# Patient Record
Sex: Male | Born: 1985
Health system: Southern US, Community
[De-identification: ages and names within clinical notes are randomized; demographics above are authoritative.]

## PROBLEM LIST (undated history)

## (undated) DIAGNOSIS — D561 Beta thalassemia: Secondary | ICD-10-CM

## (undated) DIAGNOSIS — K219 Gastro-esophageal reflux disease without esophagitis: Secondary | ICD-10-CM

## (undated) HISTORY — DX: Beta thalassemia: D56.1

---

## 2004-08-07 ENCOUNTER — Ambulatory Visit (HOSPITAL_COMMUNITY): Admission: RE | Admit: 2004-08-07 | Discharge: 2004-08-07 | Payer: Self-pay | Admitting: Internal Medicine

## 2009-09-22 ENCOUNTER — Emergency Department (HOSPITAL_COMMUNITY): Admission: EM | Admit: 2009-09-22 | Discharge: 2009-09-22 | Payer: Self-pay | Admitting: Emergency Medicine

## 2010-02-16 ENCOUNTER — Emergency Department (HOSPITAL_COMMUNITY)
Admission: EM | Admit: 2010-02-16 | Discharge: 2010-02-16 | Payer: Self-pay | Source: Home / Self Care | Admitting: Emergency Medicine

## 2010-05-04 LAB — BASIC METABOLIC PANEL
BUN: 9 mg/dL (ref 6–23)
CO2: 26 mEq/L (ref 19–32)
Chloride: 105 mEq/L (ref 96–112)
Creatinine, Ser: 0.95 mg/dL (ref 0.4–1.5)
GFR calc Af Amer: >60 mL/min (ref >60)
Sodium: 138 mEq/L (ref 135–145)

## 2010-05-04 LAB — CBC
MCHC: 31.8 g/dL (ref 30.0–36.0)
MCV: 59 fL — ABNORMAL LOW (ref 78.0–100.0)

## 2010-05-04 LAB — DIFFERENTIAL
Basophils Relative: 0 % (ref 0–1)
Eosinophils Absolute: 0.3 10*3/uL (ref 0.0–0.7)
Eosinophils Relative: 4 % (ref 0–5)
Lymphocytes Relative: 29 % (ref 12–46)
Lymphs Abs: 1.8 10*3/uL (ref 0.7–4.0)
Monocytes Relative: 8 % (ref 3–12)
Neutrophils Relative %: 59 % (ref 43–77)

## 2010-05-04 LAB — POCT CARDIAC MARKERS
Myoglobin, poc: 35.7 ng/mL (ref 12–200)
Troponin i, poc: <0.05 ng/mL (ref 0.00–0.09)

## 2010-07-10 NOTE — Procedures (Signed)
NAMECROSLEY, STEJSKAL                ACCOUNT NO.:  [id number]   MEDICAL RECORD NO.:  [id number]          PATIENT TYPE:  OUT   LOCATION:  RAD                           FACILITY:  [facility]   PHYSICIAN:  Edward L. Juanetta Gosling, M.D.DATE OF BIRTH:  16-Apr-1985   DATE OF PROCEDURE:  08/07/2004  DATE OF DISCHARGE:  08/07/2004                              PULMONARY FUNCTION TEST   1.  There is a fairly distinctly restrictive shape to the flow-volume loop.      There is a ventilatory defect but no evidence of airflow obstruction.  2.  Total lung capacity is decreased to 63% of predicted which would suggest      a restrictive pulmonary problem.  3.  DLCO is normal.  4.  Arterial blood gases are normal.       ELH/MEDQ  D:  08/09/2004  T:  08/10/2004  Job:  [id number]

## 2011-11-09 ENCOUNTER — Encounter: Payer: Self-pay | Admitting: Family Medicine

## 2011-11-09 ENCOUNTER — Ambulatory Visit (INDEPENDENT_AMBULATORY_CARE_PROVIDER_SITE_OTHER): Payer: 59 | Admitting: Family Medicine

## 2011-11-09 VITALS — BP 112/78 | HR 70 | Resp 16 | Ht 69.0 in | Wt 189.0 lb

## 2011-11-09 DIAGNOSIS — Z23 Encounter for immunization: Secondary | ICD-10-CM

## 2011-11-09 DIAGNOSIS — J309 Allergic rhinitis, unspecified: Secondary | ICD-10-CM

## 2011-11-09 DIAGNOSIS — R12 Heartburn: Secondary | ICD-10-CM

## 2011-11-09 DIAGNOSIS — Z13220 Encounter for screening for lipoid disorders: Secondary | ICD-10-CM

## 2011-11-09 DIAGNOSIS — J302 Other seasonal allergic rhinitis: Secondary | ICD-10-CM | POA: Insufficient documentation

## 2011-11-09 MED ORDER — OMEPRAZOLE 20 MG PO CPDR: 20.0000 mg | DELAYED_RELEASE_CAPSULE | Freq: Every day | ORAL | Status: DC

## 2011-11-09 MED ORDER — FEXOFENADINE HCL 180 MG PO TABS: 180.0000 mg | ORAL_TABLET | Freq: Every day | ORAL | Status: DC

## 2011-11-09 NOTE — Assessment & Plan Note (Signed)
Trial of prilosec once a day, see handout given on GERD prevention Fasting labs to be done, Exam benign

## 2011-11-09 NOTE — Patient Instructions (Signed)
Get the labs done fasting Continue multivitamin  Start acid blocking medication Use allegra for allergies  Call if heartburn does not improve  F/U 1 year or as neededHeartburn Heartburn is a painful, burning sensation in the chest. It may feel worse in certain positions, such as lying down or bending over. It is caused by stomach acid backing up into the tube that carries food from the mouth down to the stomach (lower esophagus).   CAUSES    Large meals.   Certain foods and drinks.   Exercise.   Increased acid production.   Being overweight or obese.   Certain medicines.  SYMPTOMS    Burning pain in the chest or lower throat.   Bitter taste in the mouth.   Coughing.  DIAGNOSIS   If the usual treatments for heartburn do not improve your symptoms, then tests may be done to see if there is another condition present. Possible tests may include:  X-rays.   Endoscopy. This is when a tube with a light and a camera on the end is used to examine the esophagus and the stomach.   A test to measure the amount of acid in the esophagus (pH test).   A test to see if the esophagus is working properly (esophageal manometry).   Blood, breath, or stool tests to check for bacteria that cause ulcers.  TREATMENT    Your caregiver may tell you to use certain over-the-counter medicines (antacids, acid reducers) for mild heartburn.   Your caregiver may prescribe medicines to decrease the acid in your stomach or protect your stomach lining.   Your caregiver may recommend certain diet changes.   For severe cases, your caregiver may recommend that the head of your bed be elevated on blocks. (Sleeping with more pillows is not an effective treatment as it only changes the position of your head and does not improve the main problem of stomach acid refluxing into the esophagus.)  HOME CARE INSTRUCTIONS    Take all medicines as directed by your caregiver.   Raise the head of your bed by putting  blocks under the legs if instructed to by your caregiver.   Do not exercise right after eating.   Avoid eating 2 or 3 hours before bed. Do not lie down right after eating.   Eat small meals throughout the day instead of 3 large meals.   Stop smoking if you smoke.   Maintain a healthy weight.   Identify foods and beverages that make your symptoms worse and avoid them. Foods you may want to avoid include:   Peppers.   Chocolate.   High-fat foods, including fried foods.   Spicy foods.   Garlic and onions.   Citrus fruits, including oranges, grapefruit, lemons, and limes.   Food containing tomatoes or tomato products.   Mint.   Carbonated drinks, caffeinated drinks, and alcohol.   Vinegar.  SEEK IMMEDIATE MEDICAL CARE IF:  You have severe chest pain that goes down your arm or into your jaw or neck.   You feel sweaty, dizzy, or lightheaded.   You are short of breath.   You vomit blood.   You have difficulty or pain with swallowing.   You have bloody or black, tarry stools.   You have episodes of heartburn more than 3 times a week for more than 2 weeks.  MAKE SURE YOU:  Understand these instructions.   Will watch your condition.   Will get help right away if you are not  doing well or get worse.  Document Released: 06/27/2008 Document Revised: 01/28/2011 Document Reviewed: 07/26/2010 [facility] Patient Information 2012 [place], [place].

## 2011-11-09 NOTE — Progress Notes (Signed)
  Subjective:    Patient ID: Omar Norris, male    DOB: [birth year], 26 y.o.   MRN: [id number]  HPI Patient here to establish care. No PCP in greater than 3 years. He was seen by Dr. Felecia Shelling in the past however does not followup regularly. No current medication He is concerned about indigestion. After he eats he gets a burning sensation at the top of his stomach that goes up into his chest. It is worsened with spicy foods. He has not tried any over-the-counter medications. He often finds it is worse after waking up in the morning and if he lays down after eating. He suffers with seasonal allergies and has been using Allegra like a prescription for this   Review of Systems  GEN- denies fatigue, fever, weight loss,weakness, recent illness HEENT- denies eye drainage, change in vision, nasal discharge, CVS- denies chest pain, palpitations RESP- denies SOB, cough, wheeze ABD- denies N/V, change in stools, abd pain GU- denies dysuria, hematuria, dribbling, incontinence MSK- denies joint pain, muscle aches, injury Neuro- denies headache, dizziness, syncope, seizure activity      Objective:   Physical Exam GEN- NAD, alert and oriented x3 HEENT- PERRL, EOMI, non injected sclera, pink conjunctiva, MMM, oropharynx clear Neck- Supple,  CVS- RRR, no murmur RESP-CTAB ABD-NABS,soft,NT,ND  EXT- No edema Pulses- Radial, DP- 2+ Psych-normal affect and mood         Assessment & Plan:

## 2011-11-09 NOTE — Assessment & Plan Note (Signed)
Allegra once a day as needed

## 2011-12-08 ENCOUNTER — Other Ambulatory Visit: Payer: Self-pay | Admitting: Family Medicine

## 2011-12-08 LAB — LIPID PANEL
LDL Cholesterol: 73 mg/dL (ref 0–99)
Triglycerides: 26 mg/dL (ref <150)

## 2011-12-08 LAB — CBC
HCT: 38.2 % — ABNORMAL LOW (ref 39.0–52.0)
RBC: 6.55 MIL/uL — ABNORMAL HIGH (ref 4.22–5.81)
WBC: 6.1 10*3/uL (ref 4.0–10.5)

## 2011-12-08 LAB — COMPREHENSIVE METABOLIC PANEL
ALT: 11 U/L (ref 0–53)
AST: 14 U/L (ref 0–37)
Albumin: 3.9 g/dL (ref 3.5–5.2)
Alkaline Phosphatase: 96 U/L (ref 39–117)
CO2: 27 mEq/L (ref 19–32)
Calcium: 9 mg/dL (ref 8.4–10.5)
Creat: 0.81 mg/dL (ref 0.50–1.35)
Total Bilirubin: 0.9 mg/dL (ref 0.3–1.2)
Total Protein: 6.4 g/dL (ref 6.0–8.3)

## 2011-12-09 LAB — IRON AND TIBC: TIBC: 273 ug/dL (ref 215–435)

## 2012-03-17 ENCOUNTER — Ambulatory Visit (INDEPENDENT_AMBULATORY_CARE_PROVIDER_SITE_OTHER): Payer: 59 | Admitting: Family Medicine

## 2012-03-17 ENCOUNTER — Encounter: Payer: Self-pay | Admitting: Family Medicine

## 2012-03-17 ENCOUNTER — Telehealth: Payer: Self-pay

## 2012-03-17 VITALS — BP 132/80 | HR 93 | Temp 99.0°F | Resp 18 | Ht 69.0 in | Wt 185.1 lb

## 2012-03-17 DIAGNOSIS — J029 Acute pharyngitis, unspecified: Secondary | ICD-10-CM

## 2012-03-17 MED ORDER — CEFDINIR 300 MG PO CAPS: 300.0000 mg | ORAL_CAPSULE | Freq: Two times a day (BID) | ORAL | Status: DC

## 2012-03-17 NOTE — Telephone Encounter (Signed)
Ibuprofen 600mg  every 6 hours Warm salt water gargles He can come in visit for strep swab

## 2012-03-17 NOTE — Telephone Encounter (Signed)
Patient aware and will come to have strep

## 2012-03-17 NOTE — Patient Instructions (Signed)
Pharyngitis Take antibiotics as prescribed Gargle with arm salt water F/U as needed

## 2012-03-18 ENCOUNTER — Encounter: Payer: Self-pay | Admitting: Family Medicine

## 2012-03-18 DIAGNOSIS — J029 Acute pharyngitis, unspecified: Secondary | ICD-10-CM | POA: Insufficient documentation

## 2012-03-18 NOTE — Progress Notes (Signed)
  Subjective:    Patient ID: STANLEE ROEHRIG, male    DOB: 01/23/1986, 27 y.o.   MRN: [id number]  HPI Pt presents with sore throat, feeling raw in back of throat and pain with eating and drinking for the past 36 hours. +sick contacts with children. He also has some mild sinus drainage. No oTC meds seen. Yesterday evening he noticed bumps on the back of his throat and tongue.    Review of Systems - per above   GEN- denies fatigue, fever, weight loss,weakness, recent illness HEENT- denies eye drainage, change in vision, +nasal discharge, CVS- denies chest pain, palpitations RESP- denies SOB, cough, wheeze Neuro- denies headache, dizziness, syncope, seizure activity   -     Objective:   Physical Exam  GEN- NAD, alert and oriented x3 HEENT- PERRL, EOMI, non injected sclera, pink conjunctiva, MMM, oropharynx +injection, few small vesicle appearing lesions in in posterior oropharynx, prominent papillae, mild exudate. TM clear bilat no effusion, no maxillary sinus tenderness, Nares clea Neck- Supple, shotty LAD CVS- RRR, no murmur RESP-CTAB EXT- No edema Pulses- Radial 2+ Skin- in tact, no rash        Assessment & Plan:

## 2012-03-18 NOTE — Assessment & Plan Note (Signed)
Strep negative. Based on exam and symptoms will cover for strep bacteria. Cefdinir x 7 days, pt has PCN allergy but no anaphylaxis no history of severe rash, salt water gargles, Ibuprofen for pain

## 2012-03-20 LAB — POCT RAPID STREP A (OFFICE): Rapid Strep A Screen: NEGATIVE

## 2012-03-20 NOTE — Addendum Note (Signed)
Addended by: Abner Greenspan on: 03/20/2012 08:30 AM   Modules accepted: Orders

## 2012-06-19 ENCOUNTER — Telehealth: Payer: Self-pay

## 2012-06-19 MED ORDER — DIPHENHYD-HYDROCORT-NYSTATIN MT SUSP: OROMUCOSAL | Status: DC

## 2012-06-19 NOTE — Telephone Encounter (Signed)
Patient aware and med sent  

## 2012-06-19 NOTE — Telephone Encounter (Signed)
Mouthwash sent Also ibuprofen 400-600mg  as needed for sore throat Would also add allergy medication OTC daily If no improvement or develops fever come in for visit

## 2012-06-26 ENCOUNTER — Ambulatory Visit: Payer: 59 | Admitting: Family Medicine

## 2012-11-17 ENCOUNTER — Ambulatory Visit (INDEPENDENT_AMBULATORY_CARE_PROVIDER_SITE_OTHER): Payer: 59 | Admitting: Family Medicine

## 2012-11-17 VITALS — BP 122/88 | HR 74 | Temp 97.4°F | Resp 18 | Wt 184.0 lb

## 2012-11-17 DIAGNOSIS — R3915 Urgency of urination: Secondary | ICD-10-CM

## 2012-11-17 DIAGNOSIS — R300 Dysuria: Secondary | ICD-10-CM

## 2012-11-17 LAB — URINALYSIS, ROUTINE W REFLEX MICROSCOPIC
Bilirubin Urine: NEGATIVE
Glucose, UA: NEGATIVE mg/dL
Hgb urine dipstick: NEGATIVE
Protein, ur: NEGATIVE mg/dL
Specific Gravity, Urine: 1.02 (ref 1.005–1.030)
Urobilinogen, UA: 0.2 mg/dL (ref 0.0–1.0)
pH: 7 (ref 5.0–8.0)

## 2012-11-17 NOTE — Patient Instructions (Signed)
I will call if any meds needed  F/U as needed

## 2012-11-18 ENCOUNTER — Encounter: Payer: Self-pay | Admitting: Family Medicine

## 2012-11-18 DIAGNOSIS — R300 Dysuria: Secondary | ICD-10-CM | POA: Insufficient documentation

## 2012-11-18 NOTE — Assessment & Plan Note (Signed)
UA neg, ? Related to the new soap. Exam benign Urine sent for culture, GC/Chlamydia probe- Urine also sent Stop possible offending agent

## 2012-11-18 NOTE — Progress Notes (Signed)
  Subjective:    Patient ID: Omar Norris, male    DOB: Sep 07, 1985, 27 y.o.   MRN: [id number]  HPI  Pt here with burning at end of urination for past 4 days. Denies abd pain, penile discharge, or lesions on penis. Used a new soap this week when symptoms started. Denies extramarital sexual activity.  No difficulty getting stream out, denies urgency. +frequency for couple of days. No change in BM   Review of Systems - per above  GEN- denies fatigue, fever, weight loss,weakness, recent illness ABD- denies N/V, change in stools, abd pain GU- + dysuria,denies hematuria, dribbling, incontinence        Objective:   Physical Exam GEN- NAD, alert and oriented x 3 ABD-NABS,soft,NT,ND, no Suprapubic tenderness, no CVA tenderness GU- normal appearance to head and shaft of penis, no penile discharge, urethra- no erythema surrounding, no rash seen       Assessment & Plan:

## 2012-11-20 ENCOUNTER — Telehealth: Payer: Self-pay | Admitting: Family Medicine

## 2012-11-20 ENCOUNTER — Encounter: Payer: Self-pay | Admitting: Family Medicine

## 2012-11-21 NOTE — Telephone Encounter (Signed)
Left message to return my call.  

## 2012-11-22 NOTE — Telephone Encounter (Signed)
Pt aware of results 

## 2012-12-13 ENCOUNTER — Telehealth: Payer: Self-pay | Admitting: Family Medicine

## 2012-12-13 MED ORDER — IBUPROFEN 600 MG PO TABS: 600.0000 mg | ORAL_TABLET | Freq: Four times a day (QID) | ORAL | Status: DC | PRN

## 2012-12-13 MED ORDER — CLINDAMYCIN HCL 300 MG PO CAPS: 300.0000 mg | ORAL_CAPSULE | Freq: Three times a day (TID) | ORAL | Status: DC

## 2012-12-13 NOTE — Telephone Encounter (Signed)
Will have him start start clindaymcin due to PCN allergy TID for 1 week F/u with dentist Ibuprofen 600mg 

## 2012-12-13 NOTE — Telephone Encounter (Signed)
Message copied by Salley Scarlet on Wed Dec 13, 2012  2:05 PM ------      Message from: Kandis Fantasia B      Created: Wed Dec 13, 2012 10:23 AM       Good Morning,             Lenis is suppose to call.  He is having gum pain and the dentist can't see him until next week.  He is asking for a ABT.  Thank you   ------

## 2013-03-19 ENCOUNTER — Emergency Department (HOSPITAL_COMMUNITY)
Admission: EM | Admit: 2013-03-19 | Discharge: 2013-03-19 | Disposition: A | Discharge status: Home / Self Care | Payer: 59 | Attending: Emergency Medicine | Admitting: Emergency Medicine

## 2013-03-19 ENCOUNTER — Encounter (HOSPITAL_COMMUNITY): Payer: Self-pay | Admitting: Emergency Medicine

## 2013-03-19 ENCOUNTER — Emergency Department (HOSPITAL_COMMUNITY)
Admission: EM | Admit: 2013-03-19 | Discharge: 2013-03-19 | Disposition: A | Discharge status: Nursing Home (Includes ICF) | Payer: 59 | Source: Home / Self Care | Attending: Emergency Medicine | Admitting: Emergency Medicine

## 2013-03-19 ENCOUNTER — Emergency Department (HOSPITAL_COMMUNITY): Payer: 59

## 2013-03-19 ENCOUNTER — Emergency Department (INDEPENDENT_AMBULATORY_CARE_PROVIDER_SITE_OTHER): Payer: 59

## 2013-03-19 DIAGNOSIS — Z792 Long term (current) use of antibiotics: Secondary | ICD-10-CM | POA: Insufficient documentation

## 2013-03-19 DIAGNOSIS — Z880 Allergy status to penicillin: Secondary | ICD-10-CM | POA: Insufficient documentation

## 2013-03-19 DIAGNOSIS — R079 Chest pain, unspecified: Secondary | ICD-10-CM

## 2013-03-19 DIAGNOSIS — Z87891 Personal history of nicotine dependence: Secondary | ICD-10-CM | POA: Insufficient documentation

## 2013-03-19 LAB — BASIC METABOLIC PANEL
BUN: 11 mg/dL (ref 6–23)
CO2: 25 mEq/L (ref 19–32)
Calcium: 9 mg/dL (ref 8.4–10.5)
Chloride: 103 mEq/L (ref 96–112)
Creatinine, Ser: 0.95 mg/dL (ref 0.50–1.35)
GFR calc Af Amer: >90 mL/min (ref >90)
GLUCOSE: 89 mg/dL (ref 70–99)
POTASSIUM: 4.5 meq/L (ref 3.7–5.3)
Sodium: 139 mEq/L (ref 137–147)

## 2013-03-19 LAB — CBC WITH DIFFERENTIAL/PLATELET
BASOS PCT: 0 % (ref 0–1)
Basophils Absolute: 0 10*3/uL (ref 0.0–0.1)
EOS ABS: 0.2 10*3/uL (ref 0.0–0.7)
Eosinophils Relative: 3 % (ref 0–5)
HEMATOCRIT: 39.5 % (ref 39.0–52.0)
Hemoglobin: 12.7 g/dL — ABNORMAL LOW (ref 13.0–17.0)
LYMPHS PCT: 25 % (ref 12–46)
Lymphs Abs: 1.7 10*3/uL (ref 0.7–4.0)
MCH: 19.3 pg — AB (ref 26.0–34.0)
MCHC: 32.2 g/dL (ref 30.0–36.0)
MCV: 60.1 fL — AB (ref 78.0–100.0)
Monocytes Absolute: 0.4 10*3/uL (ref 0.1–1.0)
Monocytes Relative: 6 % (ref 3–12)
Neutro Abs: 4.5 10*3/uL (ref 1.7–7.7)
Neutrophils Relative %: 66 % (ref 43–77)
Platelets: 243 10*3/uL (ref 150–400)
RBC: 6.57 MIL/uL — ABNORMAL HIGH (ref 4.22–5.81)
RDW: 15.6 % — ABNORMAL HIGH (ref 11.5–15.5)
WBC: 6.8 10*3/uL (ref 4.0–10.5)

## 2013-03-19 LAB — TROPONIN I
Troponin I: <0.3 ng/mL (ref <0.30)
Troponin I: <0.3 ng/mL (ref <0.30)

## 2013-03-19 MED ORDER — SODIUM CHLORIDE 0.9 % IV SOLN: Freq: Once | INTRAVENOUS | Status: DC

## 2013-03-19 MED ORDER — ASPIRIN 81 MG PO CHEW
CHEWABLE_TABLET | ORAL | Status: AC
  Filled 2013-03-19: qty 4

## 2013-03-19 MED ORDER — OMEPRAZOLE 20 MG PO CPDR: 20.0000 mg | DELAYED_RELEASE_CAPSULE | Freq: Every day | ORAL | Status: DC

## 2013-03-19 MED ORDER — NITROGLYCERIN 0.4 MG SL SUBL
0.4000 mg | SUBLINGUAL_TABLET | SUBLINGUAL | Status: DC | PRN
  Administered 2013-03-19: 0.4 mg via SUBLINGUAL

## 2013-03-19 MED ORDER — MORPHINE SULFATE 4 MG/ML IJ SOLN
4.0000 mg | Freq: Once | INTRAMUSCULAR | Status: AC
  Administered 2013-03-19: 4 mg via INTRAVENOUS
  Filled 2013-03-19: qty 1

## 2013-03-19 MED ORDER — ASPIRIN 81 MG PO CHEW
324.0000 mg | CHEWABLE_TABLET | Freq: Once | ORAL | Status: AC
  Administered 2013-03-19: 324 mg via ORAL

## 2013-03-19 MED ORDER — NITROGLYCERIN 0.4 MG SL SUBL
SUBLINGUAL_TABLET | SUBLINGUAL | Status: AC
  Filled 2013-03-19: qty 25

## 2013-03-19 NOTE — ED Provider Notes (Signed)
I assumed care in signout Repeat troponin negative I reviewed labs/ekg He has no active CP at this time Stable for d/c home BP 125/69  Pulse 65  Temp(Src) 98.6 F (37 C) (Oral)  Resp 19  Ht 5\' 10"  (1.778 m)  Wt 185 lb (83.915 kg)  BMI 26.54 kg/m2  SpO2 100%   Sharyon Cable, MD 03/19/13 [phone]

## 2013-03-19 NOTE — ED Notes (Signed)
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  Adv pt to notify front staff if sxs change.

## 2013-03-19 NOTE — ED Provider Notes (Signed)
CSN: [id number]     Arrival date & time 03/19/13  1243 History   First MD Initiated Contact with Patient 03/19/13 1244     Chief Complaint  Patient presents with  . Chest Pain    HPI Patient presents with discomfort in his chest that began this morning around 6:30.  He felt like a pressure in his lower anterior chest area.  He had no significant radiation of his pain.  He reports no associated shortness of breath.  It persisted and thus he went to the urgent care recommended he come emergency department.  The patient has his blood pressure checked every year and he has no history of hypertension.  He sees a physician and has been told he has no high cholesterol or diabetes.  He smokes cigarettes for approximately one to 2 years but no longer smokes cigarettes.  No family history of early cardiac disease.  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  His pain is not positional.  His pain is not changed by lying flat.   History reviewed. No pertinent past medical history. History reviewed. No pertinent past surgical history. History reviewed. No pertinent family history. History  Substance Use Topics  . Smoking status: Former Research scientist (life sciences)  . Smokeless tobacco: Not on file  . Alcohol Use: Yes    Review of Systems  All other systems reviewed and are negative.    Allergies  Penicillins  Home Medications   Current Outpatient Rx  Name  Route  Sig  Dispense  Refill  . clindamycin (CLEOCIN) 300 MG capsule   Oral   Take 1 capsule (300 mg total) by mouth 3 (three) times daily.   21 capsule   0   . ibuprofen (ADVIL,MOTRIN) 600 MG tablet   Oral   Take 1 tablet (600 mg total) by mouth every 6 (six) hours as needed for pain.   30 tablet   0   . Multiple Vitamin (MULTIVITAMIN) capsule   Oral   Take 1 capsule by mouth daily.          BP 127/70  Pulse 67  Temp(Src) 98.6 F (37 C) (Oral)  Resp 20  Ht 5\' 10"  (1.778 m)  Wt 185 lb  (83.915 kg)  BMI 26.54 kg/m2  SpO2 100% Physical Exam  Nursing note and vitals reviewed. Constitutional: He is oriented to person, place, and time. He appears well-developed and well-nourished.  HENT:  Head: Normocephalic and atraumatic.  Eyes: EOM are normal.  Neck: Normal range of motion.  Cardiovascular: Normal rate, regular rhythm, normal heart sounds and intact distal pulses.   Pulmonary/Chest: Effort normal and breath sounds normal. No respiratory distress. He exhibits no tenderness.  Abdominal: Soft. He exhibits no distension. There is no tenderness.  Musculoskeletal: Normal range of motion.  Neurological: He is alert and oriented to person, place, and time.  Skin: Skin is warm and dry.  Psychiatric: He has a normal mood and affect. Judgment normal.    ED Course  Procedures (including critical care time) Labs Review Labs Reviewed  CBC WITH DIFFERENTIAL - Abnormal; Notable for the following:    RBC 6.57 (*)    Hemoglobin 12.7 (*)    MCV 60.1 (*)    MCH 19.3 (*)    RDW 15.6 (*)    All other components within normal limits  BASIC METABOLIC PANEL  TROPONIN I   Imaging Review Dg Chest 2 View  03/19/2013  CLINICAL DATA:  Chest pain.  EXAM: CHEST  2 VIEW  COMPARISON:  February 16, 2010.  FINDINGS: The heart size and mediastinal contours are within normal limits. Both lungs are clear. No pleural effusion or pneumothorax is noted. The visualized skeletal structures are unremarkable.  IMPRESSION: No active cardiopulmonary disease.   Electronically Signed   By: Sabino Dick M.D.   On: 03/19/2013 12:15  I personally reviewed the imaging tests through PACS system I reviewed available ER/hospitalization records through the EMR    EKG Interpretation    Date/Time:  Monday March 19 2013 12:47:01 EST Ventricular Rate:  65 PR Interval:  211 QRS Duration: 92 QT Interval:  378 QTC Calculation: 393 R Axis:   70 Text Interpretation:  Sinus rhythm Prolonged PR interval Anterior  infarct, old ST elevation, consider inferior injury Baseline wander in lead(s) V5 subtle ST changes Nonspecific T wave abnormality, improved in Reconfirmed by Deneene Tarver  MD, Lynsey Ange ([id number]) on 03/19/2013 1:41:57 PM            MDM  No diagnosis found. With typical and atypical components.  Patient is at active chest pain mouth for many hours.  EKG is without any significant changes.  Troponin x2 be obtained.  Pain improved after morphine.  Outpatient cardiology and PCP followup recommended    Hoy Morn, MD 03/19/13 [phone]

## 2013-03-19 NOTE — ED Notes (Signed)
Notified [facility]--[facility] does not have a truck

## 2013-03-19 NOTE — ED Provider Notes (Signed)
Medical screening examination/treatment/procedure(s) were performed by non-physician practitioner and as supervising physician I was immediately available for consultation/collaboration.  Philipp Deputy, M.D.  Harden Mo, MD 03/19/13 [phone]

## 2013-03-19 NOTE — ED Notes (Signed)
Pt to department via EMS- pt was working this afternoon and started this afternoon while he was working. Went to [facility] and sent here for further evaluation. Reports his pain is sharp in nature. 20g left hand. No vitals per EMS

## 2013-03-19 NOTE — ED Provider Notes (Signed)
CSN: [id number]     Arrival date & time 03/19/13  0940 History   First MD Initiated Contact with Patient 03/19/13 1126     Chief Complaint  Patient presents with  . Chest Pain   (Consider location/radiation/quality/duration/timing/severity/associated sxs/prior Treatment) HPI Comments: 28 year old male presents complaining of chest pain. This began this morning at approximately 6:30, when he was walking at work. The pain started as very mild and was worse with taking a deep breath. The pain increased when he started doing heavy lifting at work, and got slightly better he sat down and rested. The pain is in his left chest and feels like a dull pressure. It is slightly better now than it was earlier, but is still present and is still worse with taking a deep breath. Denies any history of chest pains. No recent travel. No leg pain or swelling. No personal or family history of DVT or PE, or any clotting disorders.   Patient is a 28 y.o. male presenting with chest pain.  Chest Pain Associated symptoms: no abdominal pain, no cough, no dizziness, no fatigue, no fever, no nausea, no palpitations, no shortness of breath, not vomiting and no weakness     History reviewed. No pertinent past medical history. History reviewed. No pertinent past surgical history. History reviewed. No pertinent family history. History  Substance Use Topics  . Smoking status: Former Research scientist (life sciences)  . Smokeless tobacco: Not on file  . Alcohol Use: Yes    Review of Systems  Constitutional: Negative for fever, chills and fatigue.  HENT: Negative for sore throat.   Eyes: Negative for visual disturbance.  Respiratory: Negative for cough and shortness of breath.   Cardiovascular: Positive for chest pain. Negative for palpitations and leg swelling.  Gastrointestinal: Negative for nausea, vomiting, abdominal pain, diarrhea and constipation.  Genitourinary: Negative for dysuria, urgency, frequency and hematuria.  Musculoskeletal:  Negative for arthralgias, myalgias, neck pain and neck stiffness.  Skin: Negative for rash.  Neurological: Negative for dizziness, weakness and light-headedness.    Allergies  Penicillins  Home Medications   Current Outpatient Rx  Name  Route  Sig  Dispense  Refill  . clindamycin (CLEOCIN) 300 MG capsule   Oral   Take 1 capsule (300 mg total) by mouth 3 (three) times daily.   21 capsule   0   . ibuprofen (ADVIL,MOTRIN) 600 MG tablet   Oral   Take 1 tablet (600 mg total) by mouth every 6 (six) hours as needed for pain.   30 tablet   0   . Multiple Vitamin (MULTIVITAMIN) capsule   Oral   Take 1 capsule by mouth daily.          BP 136/58  Pulse 71  Temp(Src) 98.6 F (37 C) (Oral)  Resp 18  SpO2 100% Physical Exam  Nursing note and vitals reviewed. Constitutional: He is oriented to person, place, and time. He appears well-developed and well-nourished. No distress.  HENT:  Head: Normocephalic.  Cardiovascular: Normal rate, regular rhythm and normal heart sounds.  Exam reveals no gallop and no friction rub.   No murmur heard. Pulmonary/Chest: Effort normal and breath sounds normal. No accessory muscle usage. Not tachypneic and not bradypneic. No respiratory distress. He has no wheezes. He has no rales. He exhibits no mass, no tenderness, no bony tenderness and no deformity. Right breast exhibits no tenderness. Left breast exhibits no tenderness.  Abdominal: Normal appearance and bowel sounds are normal. There is no hepatosplenomegaly. There is no tenderness. There  is no rigidity, no rebound, no guarding, no CVA tenderness, no tenderness at McBurney's point and negative Murphy's sign. No hernia.  Neurological: He is alert and oriented to person, place, and time. Coordination normal.  Skin: Skin is warm and dry. No rash noted. He is not diaphoretic.  Psychiatric: He has a normal mood and affect. Judgment normal.    ED Course  Procedures (including critical care time) Labs  Review Labs Reviewed - No data to display Imaging Review Dg Chest 2 View  03/19/2013   CLINICAL DATA:  Chest pain.  EXAM: CHEST  2 VIEW  COMPARISON:  February 16, 2010.  FINDINGS: The heart size and mediastinal contours are within normal limits. Both lungs are clear. No pleural effusion or pneumothorax is noted. The visualized skeletal structures are unremarkable.  IMPRESSION: No active cardiopulmonary disease.   Electronically Signed   By: Sabino Dick M.D.   On: 03/19/2013 12:15      MDM   1. Chest pain    28 year old male with chest pain since this morning, worse with exertion, relieved by rest, constant. Normal EKG and chest x-ray. 324 of aspirin given here, 0.4 mg sublingual nitroglycerin, Transferred to the emergency department via [place], PA-C 03/19/13 1225

## 2013-03-19 NOTE — ED Notes (Signed)
C/o sharp left sided chest pain.  Increase in pain with deep breathing.  On set 6:30 this a.m.  Denies sob, n/v.  No otc meds taken.

## 2013-03-19 NOTE — Discharge Instructions (Signed)
Chest Pain (Nonspecific) °It is often hard to give a specific diagnosis for the cause of chest pain. There is always a chance that your pain could be related to something serious, such as a heart attack or a blood clot in the lungs. You need to follow up with your caregiver for further evaluation. °CAUSES  °· Heartburn. °· Pneumonia or bronchitis. °· Anxiety or stress. °· Inflammation around your heart (pericarditis) or lung (pleuritis or pleurisy). °· A blood clot in the lung. °· A collapsed lung (pneumothorax). It can develop suddenly on its own (spontaneous pneumothorax) or from injury (trauma) to the chest. °· Shingles infection (herpes zoster virus). °The chest wall is composed of bones, muscles, and cartilage. Any of these can be the source of the pain. °· The bones can be bruised by injury. °· The muscles or cartilage can be strained by coughing or overwork. °· The cartilage can be affected by inflammation and become sore (costochondritis). °DIAGNOSIS  °Lab tests or other studies, such as X-rays, electrocardiography, stress testing, or cardiac imaging, may be needed to find the cause of your pain.  °TREATMENT  °· Treatment depends on what may be causing your chest pain. Treatment may include: °· Acid blockers for heartburn. °· Anti-inflammatory medicine. °· Pain medicine for inflammatory conditions. °· Antibiotics if an infection is present. °· You may be advised to change lifestyle habits. This includes stopping smoking and avoiding alcohol, caffeine, and chocolate. °· You may be advised to keep your head raised (elevated) when sleeping. This reduces the chance of acid going backward from your stomach into your esophagus. °· Most of the time, nonspecific chest pain will improve within 2 to 3 days with rest and mild pain medicine. °HOME CARE INSTRUCTIONS  °· If antibiotics were prescribed, take your antibiotics as directed. Finish them even if you start to feel better. °· For the next few days, avoid physical  activities that bring on chest pain. Continue physical activities as directed. °· Do not smoke. °· Avoid drinking alcohol. °· Only take over-the-counter or prescription medicine for pain, discomfort, or fever as directed by your caregiver. °· Follow your caregiver's suggestions for further testing if your chest pain does not go away. °· Keep any follow-up appointments you made. If you do not go to an appointment, you could develop lasting (chronic) problems with pain. If there is any problem keeping an appointment, you must call to reschedule. °SEEK MEDICAL CARE IF:  °· You think you are having problems from the medicine you are taking. Read your medicine instructions carefully. °· Your chest pain does not go away, even after treatment. °· You develop a rash with blisters on your chest. °SEEK IMMEDIATE MEDICAL CARE IF:  °· You have increased chest pain or pain that spreads to your arm, neck, jaw, back, or abdomen. °· You develop shortness of breath, an increasing cough, or you are coughing up blood. °· You have severe back or abdominal pain, feel nauseous, or vomit. °· You develop severe weakness, fainting, or chills. °· You have a fever. °THIS IS AN EMERGENCY. Do not wait to see if the pain will go away. Get medical help at once. Call your local emergency services (911 in [place] Do not drive yourself to the hospital. °MAKE SURE YOU:  °· Understand these instructions. °· Will watch your condition. °· Will get help right away if you are not doing well or get worse. °Document Released: 11/18/2004 Document Revised: 05/03/2011 Document Reviewed: 09/14/2007 °[employer]® Patient Information ©2014 [employer]   [employer]. ° °

## 2013-03-28 ENCOUNTER — Ambulatory Visit (INDEPENDENT_AMBULATORY_CARE_PROVIDER_SITE_OTHER): Payer: 59 | Admitting: Family Medicine

## 2013-03-28 ENCOUNTER — Other Ambulatory Visit: Payer: Self-pay | Admitting: *Deleted

## 2013-03-28 ENCOUNTER — Encounter: Payer: Self-pay | Admitting: Family Medicine

## 2013-03-28 VITALS — BP 110/78 | HR 80 | Temp 98.5°F | Resp 18 | Ht 68.0 in | Wt 195.0 lb

## 2013-03-28 DIAGNOSIS — R0789 Other chest pain: Secondary | ICD-10-CM

## 2013-03-28 DIAGNOSIS — R12 Heartburn: Secondary | ICD-10-CM

## 2013-03-28 MED ORDER — OMEPRAZOLE 20 MG PO CPDR: 20.0000 mg | DELAYED_RELEASE_CAPSULE | Freq: Every day | ORAL | Status: DC

## 2013-03-28 NOTE — Telephone Encounter (Signed)
Meds refilled.

## 2013-03-28 NOTE — Assessment & Plan Note (Signed)
Atypical chest pain I think that this is GI related he has no risk factors for coronary artery disease and is otherwise very healthy. Per above

## 2013-03-28 NOTE — Patient Instructions (Signed)
Take the  prilosec once a day  If you have any further chest pain  F/U as needed

## 2013-03-28 NOTE — Assessment & Plan Note (Signed)
I think his chest pain was actually due to some reflux and heartburn from what he was eating as she's had a recurrent event after eating spicy foods. I'll have him take the Prilosec daily if he still has episodes through this then I will set him up with cardiology

## 2013-03-28 NOTE — Progress Notes (Signed)
Patient ID: Omar Norris, male   DOB: 1985/07/28, 28 y.o.   MRN: [id number]   Subjective:    Patient ID: Omar Norris, male    DOB: November 28, 1985, 28 y.o.   MRN: [id number]  Patient presents for Hospitalization Follow-up  patient was seen in the emergency room a few days ago secondary to chest pain. He states that he members eating biscuits and gravy that morning subsequently thereafter he began to have pain in the center of his chest that felt like he was moving towards the left side he not have any diaphoresis shortness of breath associated. He was seen at urgent care and then referred to the emergency room for rule out. His troponins were negative his EKG did show a mild elevation which I think worn it transferred to the emergency room it was also read as possible old infarct. His chest x-ray was negative in all other workup was negative. By the time he was in the emergency room his pain had resolved even before he was given a nitroglycerin. He was given some Prilosec which he states that the pain away. He was doing fine until yesterday when he was eating some hot sauce and then had the pain again he did not take Prilosec at that time. He has a very physical job has not had chest pain or shortness of breath on the job    Review Of Systems:  GEN- denies fatigue, fever, weight loss,weakness, recent illness HEENT- denies eye drainage, change in vision, nasal discharge, CVS- denies chest pain, palpitations RESP- denies SOB, cough, wheeze ABD- denies N/V, change in stools, abd pain Neuro- denies headache, dizziness, syncope, seizure activity       Objective:    BP 110/78  Pulse 80  Temp(Src) 98.5 F (36.9 C) (Oral)  Resp 18  Ht 5\' 8"  (1.727 m)  Wt 195 lb (88.451 kg)  BMI 29.66 kg/m2 GEN- NAD, alert and oriented x3 HEENT- PERRL, EOMI, non injected sclera, pink conjunctiva, MMM, oropharynx clear CVS- RRR, no murmur RESP-CTAB ABD-NABS,soft,NT,ND EXT- No edema Pulses- Radial  2+        Assessment & Plan:      Problem List Items Addressed This Visit   Heartburn - Primary     I think his chest pain was actually due to some reflux and heartburn from what he was eating as she's had a recurrent event after eating spicy foods. I'll have him take the Prilosec daily if he still has episodes through this then I will set him up with cardiology    Atypical chest pain     Atypical chest pain I think that this is GI related he has no risk factors for coronary artery disease and is otherwise very healthy. Per above       Note: This dictation was prepared with Dragon dictation along with smaller phrase technology. Any transcriptional errors that result from this process are unintentional.

## 2013-07-18 ENCOUNTER — Encounter: Payer: Self-pay | Admitting: Family Medicine

## 2013-07-18 ENCOUNTER — Ambulatory Visit (INDEPENDENT_AMBULATORY_CARE_PROVIDER_SITE_OTHER): Payer: 59 | Admitting: Family Medicine

## 2013-07-18 VITALS — BP 122/64 | HR 78 | Temp 99.1°F | Resp 16 | Ht 68.5 in | Wt 187.0 lb

## 2013-07-18 DIAGNOSIS — J069 Acute upper respiratory infection, unspecified: Secondary | ICD-10-CM

## 2013-07-18 DIAGNOSIS — R509 Fever, unspecified: Secondary | ICD-10-CM

## 2013-07-18 LAB — RAPID STREP SCREEN (MED CTR MEBANE ONLY): STREPTOCOCCUS, GROUP A SCREEN (DIRECT): NEGATIVE

## 2013-07-18 MED ORDER — AZITHROMYCIN 250 MG PO TABS: ORAL_TABLET | ORAL | Status: DC

## 2013-07-18 NOTE — Patient Instructions (Signed)
Plenty of fluids  Ibuprofen or TYlenol for fever I have given Zpak but do not start this unless you are not getting better Sat/ Sunday Take Mucinex DM F/U as needed  Upper Respiratory Infection, Adult An upper respiratory infection (URI) is also known as the common cold. It is often caused by a type of germ (virus). Colds are easily spread (contagious). You can pass it to others by kissing, coughing, sneezing, or drinking out of the same glass. Usually, you get better in 1 or 2 weeks.  HOME CARE   Only take medicine as told by your doctor.  Use a warm mist humidifier or breathe in steam from a hot shower.  Drink enough water and fluids to keep your pee (urine) clear or pale yellow.  Get plenty of rest.  Return to work when your temperature is back to normal or as told by your doctor. You may use a face mask and wash your hands to stop your cold from spreading. GET HELP RIGHT AWAY IF:   After the first few days, you feel you are getting worse.  You have questions about your medicine.  You have chills, shortness of breath, or brown or red spit (mucus).  You have yellow or brown snot (nasal discharge) or pain in the face, especially when you bend forward.  You have a fever, puffy (swollen) neck, pain when you swallow, or white spots in the back of your throat.  You have a bad headache, ear pain, sinus pain, or chest pain.  You have a high-pitched whistling sound when you breathe in and out (wheezing).  You have a lasting cough or cough up blood.  You have sore muscles or a stiff neck. MAKE SURE YOU:   Understand these instructions.  Will watch your condition.  Will get help right away if you are not doing well or get worse. Document Released: 07/28/2007 Document Revised: 05/03/2011 Document Reviewed: 06/15/2010 [facility] Patient Information 2014 [place], [place].

## 2013-07-19 ENCOUNTER — Encounter: Payer: Self-pay | Admitting: Family Medicine

## 2013-07-19 NOTE — Progress Notes (Signed)
Patient ID: Omar Norris, male   DOB: Jul 12, 1985, 28 y.o.   MRN: [id number]   Subjective:    Patient ID: Omar Norris, male    DOB: 1985-08-02, 28 y.o.   MRN: [id number]  Patient presents for Illness  patient here with cough with production fever MAXIMUM TEMPERATURE 100.7 body aches for the past day and half. Positive sick contact with his children who have been sick as well as his parent's one of whom has bronchitis and respiratory infection. He has taken Tylenol for the fever but no other over-the-counter medications. He's not had any vomiting or diarrhea no skin rash. No shortness of breath or chest pain    Review Of Systems:  GEN- denies fatigue,+ fever, weight loss,weakness, recent illness HEENT- denies eye drainage, change in vision, +nasal discharge, sore throat CVS- denies chest pain, palpitations RESP- denies SOB,+ cough, wheeze ABD- denies N/V, change in stools, abd pain GU- denies dysuria, hematuria, dribbling, incontinence MSK- denies joint pain,+ muscle aches, injury Neuro- denies headache, dizziness, syncope, seizure activity       Objective:    BP 122/64  Pulse 78  Temp(Src) 99.1 F (37.3 C) (Oral)  Resp 16  Ht 5' 8.5" (1.74 m)  Wt 187 lb (84.823 kg)  BMI 28.02 kg/m2 GEN- NAD, alert and oriented x3, fatigued appearing, low grade fever HEENT- PERRL, EOMI, non injected sclera, pink conjunctiva, MMM, oropharynx mild erythema no maxillary sinus tenderness, +nasal rhinorrhea Neck- Supple, shotty LAD CVS- RRR, no murmur RESP-CTAB EXT- No edema Pulses- Radial, DP- 2+  Strep NEG      Assessment & Plan:      Problem List Items Addressed This Visit   None    Visit Diagnoses   Upper respiratory infection    -  Primary    Viral URI, he has had multiple sick family members, I gave him Zpak but instructed not to use unless he does not improve by the weekend, mucinex, fluids     Relevant Medications       azithromycin (ZITHROMAX) tablet    Fever, unspecified         Relevant Orders       Rapid Strep Screen (Completed)       Note: This dictation was prepared with Dragon dictation along with smaller phrase technology. Any transcriptional errors that result from this process are unintentional.

## 2013-08-08 ENCOUNTER — Emergency Department (HOSPITAL_COMMUNITY)
Admission: EM | Admit: 2013-08-08 | Discharge: 2013-08-09 | Disposition: A | Discharge status: Home / Self Care | Payer: 59 | Attending: Emergency Medicine | Admitting: Emergency Medicine

## 2013-08-08 ENCOUNTER — Encounter (HOSPITAL_COMMUNITY): Payer: Self-pay | Admitting: Emergency Medicine

## 2013-08-08 DIAGNOSIS — Z880 Allergy status to penicillin: Secondary | ICD-10-CM | POA: Insufficient documentation

## 2013-08-08 DIAGNOSIS — R200 Anesthesia of skin: Secondary | ICD-10-CM

## 2013-08-08 DIAGNOSIS — R51 Headache: Secondary | ICD-10-CM | POA: Insufficient documentation

## 2013-08-08 DIAGNOSIS — R519 Headache, unspecified: Secondary | ICD-10-CM

## 2013-08-08 DIAGNOSIS — R209 Unspecified disturbances of skin sensation: Secondary | ICD-10-CM | POA: Insufficient documentation

## 2013-08-08 DIAGNOSIS — Z87891 Personal history of nicotine dependence: Secondary | ICD-10-CM | POA: Insufficient documentation

## 2013-08-08 DIAGNOSIS — Z79899 Other long term (current) drug therapy: Secondary | ICD-10-CM | POA: Insufficient documentation

## 2013-08-08 DIAGNOSIS — Z792 Long term (current) use of antibiotics: Secondary | ICD-10-CM | POA: Insufficient documentation

## 2013-08-08 NOTE — ED Provider Notes (Signed)
CSN: [id number]     Arrival date & time 08/08/13  2014 History   First MD Initiated Contact with Patient 08/08/13 2303     Chief Complaint  Patient presents with  . Headache  . Numbness     (Consider location/radiation/quality/duration/timing/severity/associated sxs/prior Treatment) The history is provided by the patient. No language interpreter was used.   HPI Comments: Omar Norris is a 28 y.o. male who presents to the Emergency Department complaining of headache radiating from the high neck down the arm. He states he has been out in the heat for his job. He states that lifting and using his arm has not made his headache worse. He states he has no history of neck surgeries. He denies urinary or bowel problems, chest pain, SOB, fevers, chills and abnormal gait.   History reviewed. No pertinent past medical history. History reviewed. No pertinent past surgical history. History reviewed. No pertinent family history. History  Substance Use Topics  . Smoking status: Former Research scientist (life sciences)  . Smokeless tobacco: Never Used  . Alcohol Use: Yes    Review of Systems  Gastrointestinal: Negative for nausea, vomiting, constipation and blood in stool.  Genitourinary: Negative for urgency, hematuria and decreased urine volume.  Neurological: Positive for headaches.  All other systems reviewed and are negative.     Allergies  Penicillins  Home Medications   Prior to Admission medications   Medication Sig Start Date End Date Taking? Authorizing Provider  azithromycin (ZITHROMAX) 250 MG tablet Take 2 tablets x 1 day, then 1 tab daily for 4 days 07/18/13   Alycia Rossetti, MD  Multiple Vitamin (MULTIVITAMIN) capsule Take 1 capsule by mouth daily.    Historical Provider, MD  omeprazole (PRILOSEC) 20 MG capsule Take 1 capsule (20 mg total) by mouth daily. 03/28/13   Alycia Rossetti, MD   Triage Vitals: BP 131/58  Pulse 63  Temp(Src) 98.2 F (36.8 C) (Oral)  Resp 20  Ht 5\' 10"  (1.778 m)  Wt  180 lb (81.647 kg)  BMI 25.83 kg/m2  SpO2 100% Physical Exam  Nursing note and vitals reviewed. Constitutional: He is oriented to person, place, and time. He appears well-developed and well-nourished. No distress.  HENT:  Head: Normocephalic and atraumatic.  Eyes: Conjunctivae and EOM are normal. Pupils are equal, round, and reactive to light.  Neck: Normal range of motion. Neck supple. No tracheal deviation present.  No midline tenderness medial or laterall  Cardiovascular: Normal rate.   Pulses:      Radial pulses are 2+ on the right side, and 2+ on the left side.  Equally strong 2+ pulses in upper extremities  Pulmonary/Chest: Effort normal. No respiratory distress.  Musculoskeletal: Normal range of motion.  Neurological: He is alert and oriented to person, place, and time. He has normal strength. No cranial nerve deficit. Coordination and gait normal. GCS eye subscore is 4. GCS verbal subscore is 5. GCS motor subscore is 6.  Finger to nose is intact. 5+ extremity strength bilaterally 5+ strength in UE and LE with f/e at major joints. Sensation to palpation intact in UE and LE. CNs 2-12 grossly intact.  EOMFI.  PERRL.   Finger nose and coordination intact bilateral.   Visual fields intact to finger testing.   Skin: Skin is warm and dry.  Psychiatric: He has a normal mood and affect. His behavior is normal.    ED Course  Procedures (including critical care time) DIAGNOSTIC STUDIES: Oxygen Saturation is 100% on room air, normal by  my interpretation.    COORDINATION OF CARE: 11:34 PM-Discussed treatment plan with pt at bedside and pt agreed to plan.   Labs Review Labs Reviewed - No data to display  Imaging Review No results found.   EKG Interpretation None      MDM   Final diagnoses:  Headache  Left arm numbness   I personally performed the services described in this documentation, which was scribed in my presence. The recorded information has been reviewed and  is accurate.  Patient with gradual onset headache, no significant medical history, normal neuro exam and vascular exam the ER, no signs of meningitis and improved with time and oral fluids. I discussed differential diagnosis at this time likely dehydration/radicular. Patient and myself agree to hold and imaging at this time as pretest prob very low and he will return for worsening or new symptoms.  Results and differential diagnosis were discussed with the patient/parent/guardian. Close follow up outpatient was discussed, comfortable with the plan.   Medications - No data to display  Filed Vitals:   08/08/13 2023  BP: 131/58  Pulse: 63  Temp: 98.2 F (36.8 C)  TempSrc: Oral  Resp: 20  Height: 5\' 10"  (1.778 m)  Weight: 180 lb (81.647 kg)  SpO2: 100%       Mariea Clonts, MD 08/09/13 0003

## 2013-08-08 NOTE — ED Notes (Signed)
Patient reports numbness and tingling in left arm and hand that started this evening. Also reports headache and neck pain.

## 2013-08-08 NOTE — ED Notes (Addendum)
Pt. Reports left neck pain that radiates to left arm and left side. Pt. Reports tingling in left arm. Pt. Reports lifting heavy objects at work. Pt. Reports headache earlier today but denies headache at present. Pt. Denies chest pain, denies nausea/vomiting/diarrhea.

## 2013-08-09 NOTE — Discharge Instructions (Signed)
Return to the ER if your numbness returns and persists, you develop weakness, bowel or bladder changes, severe headache it comes on more suddenly or new concerns such as fever neck stiffness other.  You can take ibuprofen every 6 hours or Tylenol every 4 hours for headache. Stay well-hydrated in the heat.  If you were given medicines take as directed.  If you are on coumadin or contraceptives realize their levels and effectiveness is altered by many different medicines.  If you have any reaction (rash, tongues swelling, other) to the medicines stop taking and see a physician.   Please follow up as directed and return to the ER or see a physician for new or worsening symptoms.  Thank you. Filed Vitals:   08/08/13 2023  BP: 131/58  Pulse: 63  Temp: 98.2 F (36.8 C)  TempSrc: Oral  Resp: 20  Height: 5\' 10"  (1.778 m)  Weight: 180 lb (81.647 kg)  SpO2: 100%

## 2013-11-21 ENCOUNTER — Ambulatory Visit (INDEPENDENT_AMBULATORY_CARE_PROVIDER_SITE_OTHER): Payer: 59 | Admitting: Family Medicine

## 2013-11-21 VITALS — BP 124/68 | HR 82 | Temp 98.5°F | Resp 16 | Ht 68.0 in | Wt 196.0 lb

## 2013-11-21 DIAGNOSIS — L989 Disorder of the skin and subcutaneous tissue, unspecified: Secondary | ICD-10-CM

## 2013-11-21 DIAGNOSIS — Z113 Encounter for screening for infections with a predominantly sexual mode of transmission: Secondary | ICD-10-CM

## 2013-11-21 DIAGNOSIS — R300 Dysuria: Secondary | ICD-10-CM

## 2013-11-21 DIAGNOSIS — Z23 Encounter for immunization: Secondary | ICD-10-CM

## 2013-11-21 DIAGNOSIS — R238 Other skin changes: Secondary | ICD-10-CM

## 2013-11-21 LAB — URINALYSIS, ROUTINE W REFLEX MICROSCOPIC
BILIRUBIN URINE: NEGATIVE
Glucose, UA: NEGATIVE mg/dL
Hgb urine dipstick: NEGATIVE
Ketones, ur: NEGATIVE mg/dL
Leukocytes, UA: NEGATIVE
NITRITE: NEGATIVE
Protein, ur: NEGATIVE mg/dL
SPECIFIC GRAVITY, URINE: 1.02 (ref 1.005–1.030)
UROBILINOGEN UA: 0.2 mg/dL (ref 0.0–1.0)
pH: 6.5 (ref 5.0–8.0)

## 2013-11-21 MED ORDER — CLOTRIMAZOLE-BETAMETHASONE 1-0.05 % EX CREA: 1.0000 "application " | TOPICAL_CREAM | Freq: Two times a day (BID) | CUTANEOUS | Status: DC

## 2013-11-21 NOTE — Progress Notes (Signed)
Patient ID: Omar Norris, male   DOB: [birth year], 28 y.o.   MRN: [id number]   Subjective:    Patient ID: Omar Norris, male    DOB: 05-Jul-1985, 28 y.o.   MRN: [id number]  Patient presents for Genital Pain  patient here with general pain for the past couple days. He states that he he wore some new boxers and since then has been very irritated in the groin region as well the bottom of his testicles and slightly on the top of his penis. He did not notice any rash. He denies any actual burning with urination or pain with urination. He's not had any discharge from the penis. He is married but at the end of the visit asked to be screened for gonorrhea Chlamydia he declined HIV test    Review Of Systems: per above  GEN- denies fatigue, fever, weight loss,weakness, recent illness HEENT- denies eye drainage, change in vision, nasal discharge, CVS- denies chest pain, palpitations RESP- denies SOB, cough, wheeze ABD- denies N/V, change in stools, abd pain GU- denies dysuria, hematuria, dribbling, incontinence        Objective:    BP 124/68  Pulse 82  Temp(Src) 98.5 F (36.9 C) (Oral)  Resp 16  Ht 5\' 8"  (1.727 m)  Wt 196 lb (88.905 kg)  BMI 29.81 kg/m2 GEN- NAD, alert and oriented x3 Gu- normal penis apperance, no discharge noted no erythema, no hernia noted, bilat groin mild moisture, no lesions, testes descended bilat, no scrotum NT,       Assessment & Plan:      Problem List Items Addressed This Visit   None    Visit Diagnoses   Burning with urination    -  Primary    Relevant Orders       Urinalysis, Routine w reflex microscopic (Completed)    Skin irritation        Possible due to clothing, moisture, given lotrisone, no specific rash, UA neg, await STD screen    Screening for STD (sexually transmitted disease)        STD screen done, declined HIV test, UA negative    Relevant Orders       GC/chlamydia probe amp, urine (Completed)    Need for prophylactic vaccination  and inoculation against influenza        Relevant Orders       Flu Vaccine QUAD 36+ mos PF IM (Fluarix Quad PF) (Completed)       Note: This dictation was prepared with Dragon dictation along with smaller phrase technology. Any transcriptional errors that result from this process are unintentional.

## 2013-11-21 NOTE — Patient Instructions (Addendum)
Use gold bond powder  lotrisone to groin as needed for irritation Flu shot  F/U as needed

## 2013-11-22 ENCOUNTER — Encounter: Payer: Self-pay | Admitting: Family Medicine

## 2013-11-22 LAB — GC/CHLAMYDIA PROBE AMP, URINE
Chlamydia, Swab/Urine, PCR: NEGATIVE
GC PROBE AMP, URINE: NEGATIVE

## 2014-01-30 ENCOUNTER — Telehealth: Payer: Self-pay | Admitting: Family Medicine

## 2014-01-30 NOTE — Telephone Encounter (Signed)
He may have hemorroids, if no rectal bleeding or any bulging he can try stool softner with BM to see if that relieves pain If blood or bulging needs OV,

## 2014-01-30 NOTE — Telephone Encounter (Signed)
Call placed to patient and patient made aware.   Patient states that he has not noted any bulging, but there was a small amount of bright red blood x1 week prior.   Advised to use stool softeners and to call back if there is any more blood or pain.   Verbalized understanding.

## 2014-01-30 NOTE — Telephone Encounter (Signed)
Call placed to patient.   States that he is concerned about possible hemorrhoid vs hernia.   States that he has some rectal pain after having BM, but pain is mostly noted to base of testicles.   Reports that pain is noted at a 5 after he has BM, but there is no pain noted during day.   MD please advise.

## 2014-01-30 NOTE — Telephone Encounter (Signed)
Patient is calling with questions about hemorrhoid And his testicles hurting   [phone]

## 2015-03-17 ENCOUNTER — Ambulatory Visit (INDEPENDENT_AMBULATORY_CARE_PROVIDER_SITE_OTHER): Payer: Commercial Managed Care - HMO | Admitting: Family Medicine

## 2015-03-17 ENCOUNTER — Other Ambulatory Visit: Payer: Self-pay | Admitting: Family Medicine

## 2015-03-17 ENCOUNTER — Encounter: Payer: Self-pay | Admitting: Family Medicine

## 2015-03-17 VITALS — BP 136/70 | HR 88 | Temp 99.2°F | Resp 16 | Ht 68.0 in | Wt 199.0 lb

## 2015-03-17 DIAGNOSIS — Z113 Encounter for screening for infections with a predominantly sexual mode of transmission: Secondary | ICD-10-CM

## 2015-03-17 DIAGNOSIS — Z0000 Encounter for general adult medical examination without abnormal findings: Secondary | ICD-10-CM | POA: Diagnosis not present

## 2015-03-17 LAB — URINALYSIS, ROUTINE W REFLEX MICROSCOPIC
Bilirubin Urine: NEGATIVE
Glucose, UA: NEGATIVE
HGB URINE DIPSTICK: NEGATIVE
KETONES UR: NEGATIVE
Leukocytes, UA: NEGATIVE
NITRITE: NEGATIVE
PH: 7 (ref 5.0–8.0)
Protein, ur: NEGATIVE
SPECIFIC GRAVITY, URINE: 1.02 (ref 1.001–1.035)

## 2015-03-17 LAB — LIPID PANEL
Cholesterol: 112 mg/dL — ABNORMAL LOW (ref 125–200)
HDL: 62 mg/dL (ref >=40)
LDL CALC: 40 mg/dL (ref <130)
TRIGLYCERIDES: 50 mg/dL (ref <150)
Total CHOL/HDL Ratio: 1.8 Ratio (ref <=5.0)
VLDL: 10 mg/dL (ref <30)

## 2015-03-17 LAB — COMPREHENSIVE METABOLIC PANEL
ALBUMIN: 4.2 g/dL (ref 3.6–5.1)
ALK PHOS: 93 U/L (ref 40–115)
ALT: 15 U/L (ref 9–46)
AST: 14 U/L (ref 10–40)
BUN: 12 mg/dL (ref 7–25)
CALCIUM: 9.2 mg/dL (ref 8.6–10.3)
CO2: 25 mmol/L (ref 20–31)
Chloride: 102 mmol/L (ref 98–110)
Creat: 0.97 mg/dL (ref 0.60–1.35)
Glucose, Bld: 87 mg/dL (ref 70–99)
Potassium: 4.2 mmol/L (ref 3.5–5.3)
SODIUM: 139 mmol/L (ref 135–146)
TOTAL PROTEIN: 6.3 g/dL (ref 6.1–8.1)
Total Bilirubin: 0.7 mg/dL (ref 0.2–1.2)

## 2015-03-17 LAB — CBC WITH DIFFERENTIAL/PLATELET
Basophils Absolute: 0 10*3/uL (ref 0.0–0.1)
Basophils Relative: 0 % (ref 0–1)
EOS ABS: 0.1 10*3/uL (ref 0.0–0.7)
EOS PCT: 2 % (ref 0–5)
HCT: 42 % (ref 39.0–52.0)
Hemoglobin: 13.1 g/dL (ref 13.0–17.0)
Lymphocytes Relative: 20 % (ref 12–46)
Lymphs Abs: 1.4 10*3/uL (ref 0.7–4.0)
MCH: 19.3 pg — ABNORMAL LOW (ref 26.0–34.0)
MCHC: 31.2 g/dL (ref 30.0–36.0)
MCV: 61.9 fL — AB (ref 78.0–100.0)
Monocytes Absolute: 0.5 10*3/uL (ref 0.1–1.0)
Monocytes Relative: 7 % (ref 3–12)
Neutro Abs: 5 10*3/uL (ref 1.7–7.7)
Neutrophils Relative %: 71 % (ref 43–77)
PLATELETS: 287 10*3/uL (ref 150–400)
RBC: 6.79 MIL/uL — AB (ref 4.22–5.81)
RDW: 18.6 % — AB (ref 11.5–15.5)
WBC: 7.1 10*3/uL (ref 4.0–10.5)

## 2015-03-17 LAB — HIV ANTIBODY (ROUTINE TESTING W REFLEX): HIV 1&2 Ab, 4th Generation: NONREACTIVE

## 2015-03-17 NOTE — Progress Notes (Signed)
Patient ID: Omar Norris, male   DOB: 1985-06-15, 30 y.o.   MRN: [id number]   Subjective:    Patient ID: Omar Norris, male    DOB: [birth year], 30 y.o.   MRN: [id number]  Patient presents for CPE  is here for complete physical exam. He has no particular concerns. He is now separated from his wife for the past 3 months. Omar Norris coparenting the children. He is not in any particular relationship will like to have STD screening. He has family history of hyperlipidemia hypertension his last cholesterol screening was 4 years ago and was normal. He is up-to-date on his tetanus booster and his flu shot he received these at work.    Review Of Systems:  GEN- denies fatigue, fever, weight loss,weakness, recent illness HEENT- denies eye drainage, change in vision, nasal discharge, CVS- denies chest pain, palpitations RESP- denies SOB, cough, wheeze ABD- denies N/V, change in stools, abd pain GU- denies dysuria, hematuria, dribbling, incontinence MSK- denies joint pain, muscle aches, injury Neuro- denies headache, dizziness, syncope, seizure activity       Objective:    BP 136/70 mmHg  Pulse 88  Temp(Src) 99.2 F (37.3 C) (Oral)  Resp 16  Ht 5\' 8"  (1.727 m)  Wt 199 lb (90.266 kg)  BMI 30.26 kg/m2 GEN- NAD, alert and oriented x3 HEENT- PERRL, EOMI, non injected sclera, pink conjunctiva, MMM, oropharynx clear Neck- Supple, no thyromegaly CVS- RRR, no murmur RESP-CTAB ABD-NABS,soft,NT,ND EXT- No edema Pulses- Radial, DP- 2+        Assessment & Plan:      Problem List Items Addressed This Visit    None    Visit Diagnoses    Routine general medical examination at a health care facility    -  Primary    CPE done,. fasting labs, immunizations ,UTD, exercises daily    Relevant Orders    CBC with Differential/Platelet    Comprehensive metabolic panel    Lipid panel    Screen for STD (sexually transmitted disease)        Relevant Orders    HIV antibody (with reflex)    RPR    GC/chlamydia probe amp, urine    Urinalysis, Routine w reflex microscopic (not at [facility]) (Completed)       Note: This dictation was prepared with Dragon dictation along with smaller phrase technology. Any transcriptional errors that result from this process are unintentional.

## 2015-03-18 LAB — GC/CHLAMYDIA PROBE AMP
CT PROBE, AMP APTIMA: NOT DETECTED
GC PROBE AMP APTIMA: NOT DETECTED

## 2015-03-18 LAB — RPR

## 2015-03-20 ENCOUNTER — Encounter: Payer: Self-pay | Admitting: *Deleted

## 2015-09-18 ENCOUNTER — Telehealth: Payer: Self-pay | Admitting: *Deleted

## 2015-09-18 NOTE — Telephone Encounter (Signed)
Received call from patient.   Reports that he has smal atypical tissue growth to forehead. Reports that he discussed growth with MD at last CPE (02/2015). Was advised that it could be removed, but he has decided against it.   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   MD please advise.   Call back # [phone]

## 2015-09-19 ENCOUNTER — Ambulatory Visit (INDEPENDENT_AMBULATORY_CARE_PROVIDER_SITE_OTHER): Payer: Commercial Managed Care - HMO | Admitting: Family Medicine

## 2015-09-19 ENCOUNTER — Encounter: Payer: Self-pay | Admitting: Family Medicine

## 2015-09-19 DIAGNOSIS — D170 Benign lipomatous neoplasm of skin and subcutaneous tissue of head, face and neck: Secondary | ICD-10-CM | POA: Diagnosis not present

## 2015-09-19 NOTE — Telephone Encounter (Signed)
I did not write anything down in reference to this. See if he can come by the office and let me look at it again,

## 2015-09-19 NOTE — Assessment & Plan Note (Signed)
Benign lipoma, normal examination, cleared to drive. Letter written, discussed healthy eating and exercise whi

## 2015-09-19 NOTE — Telephone Encounter (Signed)
Patient is coming in today at 11:00.

## 2015-09-19 NOTE — Progress Notes (Signed)
Patient ID: Omar Norris, male   DOB: December 27, 1985, 30 y.o.   MRN: [id number]      Subjective:    Patient ID: Omar Norris, male    DOB: [birth year], 30 y.o.   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 Has been present for a few years. 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 He is not having pain or discomfort he does not want it surgically removed.  No he's not had any chest pain shortness breath difficulty breathing or joint pain    Review Of Systems:  GEN- denies fatigue, fever, weight loss,weakness, recent illness HEENT- denies eye drainage, change in vision, nasal discharge, CVS- denies chest pain, palpitations RESP- denies SOB, cough, wheeze ABD- denies N/V, change in stools, abd pain GU- denies dysuria, hematuria, dribbling, incontinence MSK- denies joint pain, muscle aches, injury Neuro- denies headache, dizziness, syncope, seizure activity       Objective:    BP 130/68 (BP Location: Left Arm, Patient Position: Sitting, Cuff Size: Large)   Pulse 80   Temp 98.7 F (37.1 C) (Oral)   Resp 14   Ht 5\' 8"  (1.727 m)   Wt 198 lb (89.8 kg)   BMI 30.11 kg/m  GEN- NAD, alert and oriented x3 HEENT- PERRL, EOMI, non injected sclera, pink conjunctiva, MMM, oropharynx clear Neck- Supple, CVS- RRR, no murmur RESP-CTAB ABD-NABS,soft,NT,ND Skin- Left foreahead- lipoma 3x4cm, NTsoft  NEURO-CNII-XII intact, no deficits MSK- FROM upper and Lower ext  EXT- No edema Pulses- Radial - 2+        Assessment & Plan:      Problem List Items Addressed This Visit    Lipoma of forehead    Benign lipoma, normal examination, cleared to drive.  Letter written, discussed healthy eating and exercise whi       Other Visit Diagnoses   None.     Note: This dictation was prepared with Dragon dictation along with smaller phrase technology. Any transcriptional errors that result from this process are unintentional.

## 2015-09-19 NOTE — Patient Instructions (Signed)
Call for any concerns F/U as needed

## 2016-04-28 ENCOUNTER — Encounter: Payer: Self-pay | Admitting: Family Medicine

## 2016-04-28 ENCOUNTER — Ambulatory Visit (INDEPENDENT_AMBULATORY_CARE_PROVIDER_SITE_OTHER): Payer: 59 | Admitting: Family Medicine

## 2016-04-28 VITALS — BP 132/68 | HR 82 | Temp 98.8°F | Resp 16 | Ht 68.0 in | Wt 210.0 lb

## 2016-04-28 DIAGNOSIS — E669 Obesity, unspecified: Secondary | ICD-10-CM

## 2016-04-28 DIAGNOSIS — Z0000 Encounter for general adult medical examination without abnormal findings: Secondary | ICD-10-CM

## 2016-04-28 DIAGNOSIS — Z113 Encounter for screening for infections with a predominantly sexual mode of transmission: Secondary | ICD-10-CM

## 2016-04-28 LAB — URINALYSIS, ROUTINE W REFLEX MICROSCOPIC
BILIRUBIN URINE: NEGATIVE
GLUCOSE, UA: NEGATIVE
Hgb urine dipstick: NEGATIVE
Ketones, ur: NEGATIVE
LEUKOCYTES UA: NEGATIVE
Nitrite: NEGATIVE
Protein, ur: NEGATIVE
SPECIFIC GRAVITY, URINE: 1.025 (ref 1.001–1.035)
pH: 5.5 (ref 5.0–8.0)

## 2016-04-28 NOTE — Patient Instructions (Signed)
I recommend eye visit once a year I recommend dental visit every 6 months Goal is to  Exercise 30 minutes 5 days a week We will send a letter with lab results  F/U 1 year for Physical   

## 2016-04-28 NOTE — Progress Notes (Signed)
   Subjective:    Patient ID: Omar Norris, male    DOB: 1985/10/12, 31 y.o.   MRN: [id number]  Patient presents for CPE (is fasting)  Pt here for CPE, no current medications Family history reviewed No concerns   Wants STD screen done Immunizations UTD       Review Of Systems:  GEN- denies fatigue, fever, weight loss,weakness, recent illness HEENT- denies eye drainage, change in vision, nasal discharge, CVS- denies chest pain, palpitations RESP- denies SOB, cough, wheeze ABD- denies N/V, change in stools, abd pain GU- denies dysuria, hematuria, dribbling, incontinence MSK- denies joint pain, muscle aches, injury Neuro- denies headache, dizziness, syncope, seizure activity       Objective:    BP 132/68   Pulse 82   Temp 98.8 F (37.1 C) (Oral)   Resp 16   Ht 5\' 8"  (1.727 m)   Wt 210 lb (95.3 kg)   SpO2 98%   BMI 31.93 kg/m  GEN- NAD, alert and oriented x3 HEENT- PERRL, EOMI, non injected sclera, pink conjunctiva, MMM, oropharynx clear Neck- Supple, no thyromegaly CVS- RRR, no murmur RESP-CTAB ABD-NABS,soft,NT,ND EXT- No edema Pulses- Radial, DP- 2+        Assessment & Plan:      Problem List Items Addressed This Visit    None    Visit Diagnoses    Routine general medical examination at a health care facility    -  Primary   CPE Done, fasting labs, STD screen, healthy mail, discussed weight gain and dietary changes exercise Goal 180-190LBS   Relevant Orders   CBC with Differential/Platelet   Comprehensive metabolic panel   Lipid panel   Obesity (BMI 30-39.9)       Screen for STD (sexually transmitted disease)       Relevant Orders   HIV antibody   RPR   GC/Chlamydia Probe Amp   Urinalysis, Routine w reflex microscopic (Completed)   HSV(herpes simplex vrs) 1+2 ab-IgG   HSV(herpes simplex vrs) 1+2 ab-IgM      Note: This dictation was prepared with Dragon dictation along with smaller phrase technology. Any transcriptional errors that result  from this process are unintentional.

## 2016-04-29 ENCOUNTER — Telehealth: Payer: Self-pay | Admitting: Family Medicine

## 2016-04-29 LAB — CBC WITH DIFFERENTIAL/PLATELET
Basophils Absolute: 0 cells/uL (ref 0–200)
Basophils Relative: 0 %
Eosinophils Absolute: 189 cells/uL (ref 15–500)
Eosinophils Relative: 3 %
HCT: 44.2 % (ref 38.5–50.0)
Hemoglobin: 13.4 g/dL (ref 13.0–17.0)
LYMPHS PCT: 22 %
Lymphs Abs: 1386 cells/uL (ref 850–3900)
MCH: 19.6 pg — ABNORMAL LOW (ref 27.0–33.0)
MCHC: 30.3 g/dL — AB (ref 32.0–36.0)
MCV: 64.6 fL — ABNORMAL LOW (ref 80.0–100.0)
MONOS PCT: 4 %
Monocytes Absolute: 252 cells/uL (ref 200–950)
Neutro Abs: 4473 cells/uL (ref 1500–7800)
Neutrophils Relative %: 71 %
Platelets: 260 10*3/uL (ref 140–400)
RBC: 6.84 MIL/uL — ABNORMAL HIGH (ref 4.20–5.80)
RDW: 18.2 % — AB (ref 11.0–15.0)
WBC: 6.3 10*3/uL (ref 3.8–10.8)

## 2016-04-29 LAB — COMPREHENSIVE METABOLIC PANEL
ALT: 42 U/L (ref 9–46)
AST: 23 U/L (ref 10–40)
Albumin: 4.2 g/dL (ref 3.6–5.1)
Alkaline Phosphatase: 110 U/L (ref 40–115)
BUN: 16 mg/dL (ref 7–25)
CHLORIDE: 105 mmol/L (ref 98–110)
CO2: 26 mmol/L (ref 20–31)
Calcium: 9 mg/dL (ref 8.6–10.3)
Creat: 0.99 mg/dL (ref 0.60–1.35)
Glucose, Bld: 81 mg/dL (ref 70–99)
POTASSIUM: 4.2 mmol/L (ref 3.5–5.3)
SODIUM: 139 mmol/L (ref 135–146)
Total Bilirubin: 0.7 mg/dL (ref 0.2–1.2)
Total Protein: 6.6 g/dL (ref 6.1–8.1)

## 2016-04-29 LAB — LIPID PANEL
Cholesterol: 129 mg/dL (ref <200)
HDL: 63 mg/dL (ref >40)
LDL CALC: 58 mg/dL (ref <100)
Total CHOL/HDL Ratio: 2 Ratio (ref <5.0)
Triglycerides: 40 mg/dL (ref <150)
VLDL: 8 mg/dL (ref <30)

## 2016-04-29 LAB — HIV ANTIBODY (ROUTINE TESTING W REFLEX): HIV 1&2 Ab, 4th Generation: NONREACTIVE

## 2016-04-29 LAB — RPR

## 2016-04-29 LAB — HSV(HERPES SIMPLEX VRS) I + II AB-IGG
HSV 1 Glycoprotein G Ab, IgG: <0.9 Index (ref <0.90)
HSV 2 Glycoprotein G Ab, IgG: <0.9 Index (ref <0.90)

## 2016-04-29 LAB — GC/CHLAMYDIA PROBE AMP
CT Probe RNA: NOT DETECTED
GC Probe RNA: NOT DETECTED

## 2016-04-29 NOTE — Telephone Encounter (Signed)
Closing encounter

## 2016-04-30 ENCOUNTER — Encounter: Payer: Self-pay | Admitting: Family Medicine

## 2016-05-26 ENCOUNTER — Encounter: Payer: Self-pay | Admitting: Family Medicine

## 2016-06-22 ENCOUNTER — Encounter: Payer: Self-pay | Admitting: Family Medicine

## 2016-07-30 ENCOUNTER — Telehealth: Payer: Self-pay | Admitting: Family Medicine

## 2016-07-30 NOTE — Telephone Encounter (Signed)
Wants to know if [facility] has any good recommendations for marriage counselors.

## 2016-07-30 NOTE — Telephone Encounter (Signed)
Call placed to patient and patient made aware per VM.  

## 2016-07-30 NOTE — Telephone Encounter (Signed)
We don;t typically refer to marriage counseling, most people search through there insurance or on line I have heard of [facility] Phone: [phone]

## 2016-07-30 NOTE — Telephone Encounter (Signed)
To MD

## 2016-11-03 ENCOUNTER — Ambulatory Visit: Payer: 59 | Admitting: Family Medicine

## 2016-11-11 ENCOUNTER — Encounter: Payer: Self-pay | Admitting: Family Medicine

## 2016-11-16 ENCOUNTER — Ambulatory Visit: Payer: 59 | Admitting: Family Medicine

## 2016-11-18 ENCOUNTER — Encounter: Payer: Self-pay | Admitting: Family Medicine

## 2016-12-22 ENCOUNTER — Encounter: Payer: Self-pay | Admitting: Family Medicine

## 2016-12-22 ENCOUNTER — Ambulatory Visit (INDEPENDENT_AMBULATORY_CARE_PROVIDER_SITE_OTHER): Payer: 59 | Admitting: Family Medicine

## 2016-12-22 VITALS — BP 128/74 | HR 80 | Temp 98.6°F | Resp 14 | Ht 68.0 in | Wt 196.0 lb

## 2016-12-22 DIAGNOSIS — M549 Dorsalgia, unspecified: Secondary | ICD-10-CM

## 2016-12-22 DIAGNOSIS — Z113 Encounter for screening for infections with a predominantly sexual mode of transmission: Secondary | ICD-10-CM

## 2016-12-22 DIAGNOSIS — Z23 Encounter for immunization: Secondary | ICD-10-CM | POA: Diagnosis not present

## 2016-12-22 LAB — URINALYSIS, ROUTINE W REFLEX MICROSCOPIC
Bilirubin Urine: NEGATIVE
Glucose, UA: NEGATIVE
Hgb urine dipstick: NEGATIVE
Ketones, ur: NEGATIVE
Leukocytes, UA: NEGATIVE
Nitrite: NEGATIVE
Protein, ur: NEGATIVE
SPECIFIC GRAVITY, URINE: 1.02 (ref 1.001–1.03)
pH: 7 (ref 5.0–8.0)

## 2016-12-22 NOTE — Progress Notes (Signed)
   Subjective:    Patient ID: Omar Norris, male    DOB: 09-Jun-1985, 31 y.o.   MRN: [id number]  Patient presents for Follow-up (wants STD Check) Patient here for STD screening.  Think that his review his girlfriend was faithful he would like to be checked again.  He is not have any symptoms.  His other complaint is intermittent episodes of left-sided back discomfort groin pain that shoots down his leg.  On one occasion he had some tingling in his toes.  This is occurred over the past couple weeks.  He started bowling 2-3 times a week and he typically only notices after he bowls.  It does not last very long.  He denies any weakness in the extremities otherwise.  Does not affect him on the job.  No change in bowel or bladder.  He does not have any pain currently.    Review Of Systems:  GEN- denies fatigue, fever, weight loss,weakness, recent illness HEENT- denies eye drainage, change in vision, nasal discharge, CVS- denies chest pain, palpitations RESP- denies SOB, cough, wheeze ABD- denies N/V, change in stools, abd pain GU- denies dysuria, hematuria, dribbling, incontinence MSK- + joint pain, muscle aches, injury Neuro- denies headache, dizziness, syncope, seizure activity       Objective:    BP 128/74   Pulse 80   Temp 98.6 F (37 C) (Oral)   Resp 14   Ht 5\' 8"  (1.727 m)   Wt 196 lb (88.9 kg)   SpO2 100%   BMI 29.80 kg/m  GEN- NAD, alert and oriented x3 HEENT- PERRL, EOMI, non injected sclera, pink conjunctiva, MMM, oropharynx clear CVS- RRR, no murmur RESP-CTAB ABD-NABS,soft,NT,ND MSK- Spine NT, FROM, Neg SLR, FROM bil hips Neuro- strength 5/5 bilat, sensation in tact, DTR equal, non antalgic gait EXT- No edema Pulses- Radial 2+        Assessment & Plan:      Problem List Items Addressed This Visit    None    Visit Diagnoses    Screening examination for STD (sexually transmitted disease)    -  Primary   STD screen at pt request, no symptoms    Relevant  Orders   HIV antibody   Hepatitis C antibody   C. trachomatis/N. gonorrhoeae RNA   Urinalysis, Routine w reflex microscopic (Completed)   RPR   HSV(herpes simplex vrs) 1+2 ab-IgG   Acute left-sided back pain, unspecified back location       nO RED flags, discussed mechanics when he is boweling, to avoid that is causing pain, an use aleve, ibuprofen as well,, will monitor for now    Need for immunization against influenza       Relevant Orders   Flu Vaccine QUAD 36+ mos IM (Completed)      Note: This dictation was prepared with Dragon dictation along with smaller phrase technology. Any transcriptional errors that result from this process are unintentional.

## 2016-12-22 NOTE — Patient Instructions (Addendum)
F/u 6 MONTHS  PHYSICAL  Take aleve or ibuprofen  Flu shot given

## 2016-12-23 LAB — HEPATITIS C ANTIBODY
HEP C AB: NONREACTIVE
SIGNAL TO CUT-OFF: 0.01 (ref <1.00)

## 2016-12-23 LAB — RPR: RPR Ser Ql: NONREACTIVE

## 2016-12-23 LAB — HIV ANTIBODY (ROUTINE TESTING W REFLEX): HIV: NONREACTIVE

## 2016-12-23 LAB — HSV(HERPES SIMPLEX VRS) I + II AB-IGG
HAV 1 IGG,TYPE SPECIFIC AB: <0.9 index
HSV 2 IGG,TYPE SPECIFIC AB: <0.9 index

## 2016-12-23 LAB — C. TRACHOMATIS/N. GONORRHOEAE RNA
C. trachomatis RNA, TMA: NOT DETECTED
N. gonorrhoeae RNA, TMA: NOT DETECTED

## 2016-12-24 ENCOUNTER — Telehealth: Payer: Self-pay | Admitting: *Deleted

## 2016-12-24 DIAGNOSIS — R52 Pain, unspecified: Secondary | ICD-10-CM

## 2016-12-24 MED ORDER — METHYLPREDNISOLONE 4 MG PO TBPK: ORAL_TABLET | ORAL | 0 refills | Status: DC

## 2016-12-24 NOTE — Telephone Encounter (Signed)
Orders placed and prescription sent to pharmacy.   Call placed to patient. [place].

## 2016-12-24 NOTE — Telephone Encounter (Signed)
Get x-ray  of lumbar spine  Dx- back pain with sciatica Send over Medrol dose pak

## 2016-12-24 NOTE — Telephone Encounter (Signed)
Patient returned call and made aware.

## 2016-12-24 NOTE — Telephone Encounter (Signed)
Received call from patient.   States that he continues to have increased pain in groin radiating down leg. States that he was bowling on 12/22/2016 and noted more pain in area. States that he has been using IBU as directed. Reports that IBU relieves pain for a while, but it resumes quickly.   MD please advise.

## 2016-12-27 ENCOUNTER — Ambulatory Visit (HOSPITAL_COMMUNITY)
Admission: RE | Admit: 2016-12-27 | Discharge: 2016-12-27 | Disposition: A | Discharge status: Home / Self Care | Payer: 59 | Source: Ambulatory Visit | Attending: Family Medicine | Admitting: Family Medicine

## 2016-12-27 ENCOUNTER — Ambulatory Visit (INDEPENDENT_AMBULATORY_CARE_PROVIDER_SITE_OTHER): Payer: 59 | Admitting: Family Medicine

## 2016-12-27 ENCOUNTER — Encounter: Payer: Self-pay | Admitting: Family Medicine

## 2016-12-27 VITALS — BP 130/82 | HR 80 | Temp 98.6°F | Resp 14 | Ht 68.0 in | Wt 195.0 lb

## 2016-12-27 DIAGNOSIS — M545 Low back pain: Secondary | ICD-10-CM | POA: Diagnosis not present

## 2016-12-27 DIAGNOSIS — R52 Pain, unspecified: Secondary | ICD-10-CM

## 2016-12-27 DIAGNOSIS — M5410 Radiculopathy, site unspecified: Secondary | ICD-10-CM

## 2016-12-27 MED ORDER — GABAPENTIN 100 MG PO CAPS: ORAL_CAPSULE | ORAL | 3 refills | Status: DC

## 2016-12-27 MED ORDER — IBUPROFEN 600 MG PO TABS: 600.0000 mg | ORAL_TABLET | Freq: Three times a day (TID) | ORAL | 1 refills | Status: DC | PRN

## 2016-12-27 NOTE — Progress Notes (Signed)
   Subjective:    Patient ID: Omar Norris, male    DOB: 1985/03/24, 31 y.o.   MRN: [id number]  Patient presents for Pain (continues to have pain in L LE from groin to lower leg)  Pt here ongoing left left pain. He was seen last week, expressed episodes of left sided lower ext pain, into grown but down to foot, with tingling on and off after bowling. Over the weekend he had episodes where he would sit for a period or time or use restroom and entire leg would go numb. He then had episodes of tingling numbness just in foot. Pain is mostly in quads and into groin, No specific injury. Medrol dosepak helped some, also taking ibuprofen  No change in bowel or bladder     Review Of Systems: per above   GEN- denies fatigue, fever, weight loss,weakness, recent illness HEENT- denies eye drainage, change in vision, nasal discharge, CVS- denies chest pain, palpitations RESP- denies SOB, cough, wheeze ABD- denies N/V, change in stools, abd pain GU- denies dysuria, hematuria, dribbling, incontinence MSK- +joint pain, muscle aches, injury Neuro- denies headache, dizziness, syncope, seizure activity       Objective:    BP 130/82   Pulse 80   Temp 98.6 F (37 C) (Oral)   Resp 14   Ht 5\' 8"  (1.727 m)   Wt 195 lb (88.5 kg)   SpO2 97%   BMI 29.65 kg/m  GEN- NAD, alert and oriented x3 MSK- Spine NT, good ROM, FROM HIP No palpable hernia in inguinal region Neuro- normal tone, monofilament in tact LE, strength 4+/5 left  compared to right  Non atalgic gait Ext-no edema        Assessment & Plan:      Problem List Items Addressed This Visit    None    Visit Diagnoses    Radicular syndrome of left leg    -  Primary   He has radicular and neuropathic pain in leg, though minimal back pain. No specific injury, just boweling which may have aggravated it  He has good ROM of spine in general Doubt knee or hip injury. Xray of spine was normal But I can not explain the neuropathic  symptoms Obtain MRI of lumbar spine look for impingment Start gabapentin 100-200mg  at bedtime Complete medrol dosepak Then use ibuprofen 600mg      Relevant Medications   gabapentin (NEURONTIN) 100 MG capsule   Back pain with radiculopathy       Relevant Medications   ibuprofen (ADVIL,MOTRIN) 600 MG tablet   gabapentin (NEURONTIN) 100 MG capsule      Note: This dictation was prepared with Dragon dictation along with smaller phrase technology. Any transcriptional errors that result from this process are unintentional.

## 2016-12-27 NOTE — Patient Instructions (Signed)
Take gabepentin at bedtime 1-2 capsules Ibuprofen 600mg  with food MRI to be done

## 2016-12-31 ENCOUNTER — Telehealth: Payer: Self-pay | Admitting: Family Medicine

## 2016-12-31 DIAGNOSIS — M545 Low back pain: Secondary | ICD-10-CM

## 2016-12-31 NOTE — Telephone Encounter (Signed)
Pt aware that Omar Norris has denied doing MRI.  Per provider place referral to Ortho.

## 2017-01-16 ENCOUNTER — Other Ambulatory Visit: Payer: 59

## 2017-03-10 ENCOUNTER — Emergency Department (HOSPITAL_COMMUNITY)
Admission: EM | Admit: 2017-03-10 | Discharge: 2017-03-10 | Disposition: A | Discharge status: Home / Self Care | Payer: 59 | Attending: Emergency Medicine | Admitting: Emergency Medicine

## 2017-03-10 ENCOUNTER — Encounter: Payer: Self-pay | Admitting: Physician Assistant

## 2017-03-10 ENCOUNTER — Ambulatory Visit (INDEPENDENT_AMBULATORY_CARE_PROVIDER_SITE_OTHER): Payer: 59 | Admitting: Physician Assistant

## 2017-03-10 ENCOUNTER — Other Ambulatory Visit: Payer: Self-pay

## 2017-03-10 ENCOUNTER — Encounter (HOSPITAL_COMMUNITY): Payer: Self-pay | Admitting: Emergency Medicine

## 2017-03-10 VITALS — BP 126/84 | HR 70 | Temp 97.8°F | Resp 16 | Ht 68.0 in | Wt 196.8 lb

## 2017-03-10 DIAGNOSIS — Z202 Contact with and (suspected) exposure to infections with a predominantly sexual mode of transmission: Secondary | ICD-10-CM | POA: Diagnosis not present

## 2017-03-10 DIAGNOSIS — B356 Tinea cruris: Secondary | ICD-10-CM | POA: Insufficient documentation

## 2017-03-10 DIAGNOSIS — M5440 Lumbago with sciatica, unspecified side: Secondary | ICD-10-CM

## 2017-03-10 DIAGNOSIS — Z79899 Other long term (current) drug therapy: Secondary | ICD-10-CM | POA: Diagnosis not present

## 2017-03-10 DIAGNOSIS — R21 Rash and other nonspecific skin eruption: Secondary | ICD-10-CM | POA: Diagnosis present

## 2017-03-10 DIAGNOSIS — Z87891 Personal history of nicotine dependence: Secondary | ICD-10-CM | POA: Diagnosis not present

## 2017-03-10 DIAGNOSIS — Z113 Encounter for screening for infections with a predominantly sexual mode of transmission: Secondary | ICD-10-CM

## 2017-03-10 MED ORDER — CYCLOBENZAPRINE HCL 10 MG PO TABS: 10.0000 mg | ORAL_TABLET | Freq: Every day | ORAL | 0 refills | Status: DC

## 2017-03-10 MED ORDER — TERBINAFINE HCL 1 % EX CREA: 1.0000 "application " | TOPICAL_CREAM | Freq: Two times a day (BID) | CUTANEOUS | 0 refills | Status: DC

## 2017-03-10 NOTE — ED Notes (Signed)
Awaiting lab draw for discharge.

## 2017-03-10 NOTE — ED Triage Notes (Signed)
Patient reports developing a rash around his genitals after having sex.

## 2017-03-10 NOTE — ED Provider Notes (Signed)
[facility] EMERGENCY DEPARTMENT Provider Note   CSN: [id number] Arrival date & time: 03/10/17  1735     History   Chief Complaint Chief Complaint  Patient presents with  . Exposure to STD    HPI Omar Norris is a 32 y.o. male.  HPI Omar Norris is a 32 y.o. male presents to emergency department complaining of a rash to private area.  Patient states he noticed rash several days ago.  He reports he has been involved in a new relationship.  He uses protection sometimes.  He denies any penile discharge.  No dysuria or hematuria.  He states that he is worried he may have an STD.  He states the rash she has is itchy but not painful.  No history of the same.  No treatment prior to coming in.  Past Medical History:  Diagnosis Date  . Beta thalassemia [facility])     Patient Active Problem List   Diagnosis Date Noted  . Lipoma of forehead 09/19/2015  . Seasonal allergies 11/09/2011    History reviewed. No pertinent surgical history.     Home Medications    Prior to Admission medications   Medication Sig Start Date End Date Taking? Authorizing Provider  cyclobenzaprine (FLEXERIL) 10 MG tablet Take 1 tablet (10 mg total) by mouth at bedtime. 03/10/17   Orlena Sheldon, PA-C  gabapentin (NEURONTIN) 100 MG capsule Take 1-2 capsules at bedtime 12/27/16   Inkerman, Modena Nunnery, MD  ibuprofen (ADVIL,MOTRIN) 600 MG tablet Take 1 tablet (600 mg total) every 8 (eight) hours as needed by mouth. 12/27/16   Alycia Rossetti, MD    Family History Family History  Problem Relation Age of Onset  . Hypertension Mother   . Hyperlipidemia Mother     Social History Social History   Tobacco Use  . Smoking status: Former Research scientist (life sciences)  . Smokeless tobacco: Never Used  Substance Use Topics  . Alcohol use: Yes    Alcohol/week: 1.2 oz    Types: 2 Cans of beer per week    Comment: weekends  . Drug use: No     Allergies   Patient has no known allergies.   Review of Systems Review of Systems    Constitutional: Negative for chills and fever.  Genitourinary: Positive for genital sores. Negative for difficulty urinating, discharge, dysuria, hematuria, penile pain, penile swelling, scrotal swelling and testicular pain.  Skin: Positive for rash.  All other systems reviewed and are negative.    Physical Exam Updated Vital Signs BP (!) 150/81   Pulse 82   Temp 98.6 F (37 C) (Oral)   Resp 16   Ht 5\' 8"  (1.727 m)   Wt 88.9 kg (196 lb)   SpO2 99%   BMI 29.80 kg/m   Physical Exam  Constitutional: He appears well-developed and well-nourished. No distress.  Eyes: Conjunctivae are normal.  Neck: Neck supple.  Cardiovascular: Normal rate.  Pulmonary/Chest: No respiratory distress.  Abdominal: He exhibits no distension.  Genitourinary:  Genitourinary Comments: Normal penis and scrotum.  No penile discharge.  There is scaly dry rash to bilateral groin.  No vesicles, no ulcerations, no other lesions.  Skin: Skin is warm and dry.  Nursing note and vitals reviewed.    ED Treatments / Results  Labs (all labs ordered are listed, but only abnormal results are displayed) Labs Reviewed  HIV ANTIBODY (ROUTINE TESTING)  RPR  GC/CHLAMYDIA PROBE AMP ([place]) NOT AT [facility]    EKG  EKG Interpretation  None       Radiology No results found.  Procedures Procedures (including critical care time)  Medications Ordered in ED Medications - No data to display   Initial Impression / Assessment and Plan / ED Course  I have reviewed the triage vital signs and the nursing notes.  Pertinent labs & imaging results that were available during my care of the patient were reviewed by me and considered in my medical decision making (see chart for details).     Patient in emergency department with a rash to the perineum and bilateral groin, concerned about STI.  On exam, there is no vesicles, no ulcerative lesions, normal penis and scrotum with no drainage or tenderness.  GC and  chlamydia cultures obtained, will get RPR and HIV.  Will have patient follow-up with health department.  Vitals:   03/10/17 1800 03/10/17 1801  BP:  (!) 150/81  Pulse: 82   Resp: 16   Temp: 98.6 F (37 C)   TempSrc: Oral   SpO2: 99%   Weight:  88.9 kg (196 lb)  Height:  5\' 8"  (1.727 m)     Final Clinical Impressions(s) / ED Diagnoses   Final diagnoses:  Tinea cruris    ED Discharge Orders        Ordered    terbinafine (LAMISIL AT) 1 % cream  2 times daily     03/10/17 1931       Jeannett Senior, PA-C 03/10/17 1932    Varney Biles, MD 03/11/17 0131

## 2017-03-10 NOTE — Discharge Instructions (Signed)
Your gonorrhea, chlamydia cultures are pending and will take 1-2 days.  Your syphilis and HIV blood tests are pending and should be back either this evening or tomorrow.  You can review results in my chart.  If any of these tests come back abnormal, we will also contact you for further treatment.  Use Lamisil cream as prescribed until rash improves.  Follow-up with your doctor or health department

## 2017-03-10 NOTE — Progress Notes (Signed)
Patient ID: Omar Norris MRN: [id number], DOB: [birth year], 32 y.o. Date of Encounter: @DATE @  Chief Complaint:  Chief Complaint  Patient presents with  . std check  . pain in left leg and groin area    x2days    HPI: 32 y.o. year old male  presents with above.   He reports that he has recently been feeling some discomfort in his left low back that wraps around the left lateral hip area and comes down the left thigh. He makes reference to seeing Dr. Buelah Manis about this in the past and having an x-ray on his back. Reports that the symptoms "come and go. "  States that he is in a bowling league. For work he drives a Insurance claims handler.  This involves climbing up and down from the truck multiple times throughout the day.  Also climbing up a ladder and cleaning out the drum.  Asked if he is taking any medication for this currently. He states that when the symptoms reoccurred on around Monday or Tuesday of this week, then he started back taking the medication gabapentin. Otherwise, he had gone off the gabapentin when symptoms had resolved from prior episode.  Reviewed his prior office note with Dr. Buelah Manis regarding back pain.  He discussed these similar symptoms at visit with her on 12/22/16.  Also discussed these type symptoms at visit with her 12/27/16. X-ray lumbar spine 12/27/16 which was normal.   Today he also reports that he wants STD screen. When I was reviewing his chart I also saw that he came in for STD screen also on 12/22/16.  Reviewed with him that he did just have STD screening a few months ago.  States that he does want to have this repeated because his girlfriend "said some things that did not quite add up ".  States that he is having no signs or symptoms of any STDs.  Has been having no penile discharge.  No burning with urination. No skin lesions on genital area.   Past Medical History:  Diagnosis Date  . Beta thalassemia (HCC)      Home Meds: Outpatient Medications  Prior to Visit  Medication Sig Dispense Refill  . gabapentin (NEURONTIN) 100 MG capsule Take 1-2 capsules at bedtime 60 capsule 3  . ibuprofen (ADVIL,MOTRIN) 600 MG tablet Take 1 tablet (600 mg total) every 8 (eight) hours as needed by mouth. 30 tablet 1  . methylPREDNISolone (MEDROL DOSEPAK) 4 MG TBPK tablet Use as directed on package. 21 tablet 0   No facility-administered medications prior to visit.     Allergies: No Known Allergies  Social History   Socioeconomic History  . Marital status: Married    Spouse name: Not on file  . Number of children: Not on file  . Years of education: Not on file  . Highest education level: Not on file  Social Needs  . Financial resource strain: Not on file  . Food insecurity - worry: Not on file  . Food insecurity - inability: Not on file  . Transportation needs - medical: Not on file  . Transportation needs - non-medical: Not on file  Occupational History  . Not on file  Tobacco Use  . Smoking status: Former Research scientist (life sciences)  . Smokeless tobacco: Never Used  Substance and Sexual Activity  . Alcohol use: Yes  . Drug use: No  . Sexual activity: Yes    Birth control/protection: Condom  Other Topics Concern  . Not on file  Social  History Narrative  . Not on file    Family History  Problem Relation Age of Onset  . Hypertension Mother   . Hyperlipidemia Mother      Review of Systems:  See HPI for pertinent ROS. All other ROS negative.    Physical Exam: Blood pressure 126/84, pulse 70, temperature 97.8 F (36.6 C), temperature source Oral, resp. rate 16, height 5\' 8"  (1.727 m), weight 89.3 kg (196 lb 12.8 oz), SpO2 99 %., Body mass index is 29.92 kg/m. General: WNWD AAM. Appears in no acute distress. Neck: Supple. No thyromegaly. No lymphadenopathy. Lungs: Clear bilaterally to auscultation without wheezes, rales, or rhonchi. Breathing is unlabored. Heart: RRR with S1 S2. No murmurs, rubs, or gallops. Musculoskeletal:  Strength and tone  normal for age.  Right leg raise normal bilaterally.  Hip abduction is normal bilaterally.  Patella DTRs 2+ and equal bilaterally. Extremities/Skin: Warm and dry.  Neuro: Alert and oriented X 3. Moves all extremities spontaneously. Gait is normal. CNII-XII grossly in tact. Psych:  Responds to questions appropriately with a normal affect.     ASSESSMENT AND PLAN:  32 y.o. year old male with  1. Acute left-sided low back pain with sciatica, sciatica laterality unspecified Continue the gabapentin.  Can add Flexeril at night as muscle relaxer.  Recommend that he stretch every morning and every evening and also to stretch before and after his bowling.  Discussed specific stretches to do to stretch lower back and left periformis and other muscles in left buttock region.. - cyclobenzaprine (FLEXERIL) 10 MG tablet; Take 1 tablet (10 mg total) by mouth at bedtime.  Dispense: 30 tablet; Refill: 0  2. Screening for STDs (sexually transmitted diseases)  - C. trachomatis/N. gonorrhoeae RNA - Hepatitis panel, acute - HIV antibody - HSV(herpes smplx)abs-1+2(IgG+IgM)-bld - RPR   Signed, [address] [place], [place], [facility] 03/10/2017 9:00 AM

## 2017-03-10 NOTE — Addendum Note (Signed)
Addended by: Vonna Kotyk A on: 03/10/2017 10:12 AM   Modules accepted: Orders

## 2017-03-11 ENCOUNTER — Telehealth: Payer: Self-pay | Admitting: *Deleted

## 2017-03-11 ENCOUNTER — Ambulatory Visit: Payer: 59 | Admitting: Family Medicine

## 2017-03-11 LAB — HEPATITIS PANEL, ACUTE
HEP C AB: NONREACTIVE
Hep A IgM: NONREACTIVE
Hep B C IgM: NONREACTIVE
Hepatitis B Surface Ag: NONREACTIVE
SIGNAL TO CUT-OFF: 0.01 (ref <1.00)

## 2017-03-11 LAB — GC/CHLAMYDIA PROBE AMP (~~LOC~~) NOT AT ARMC
Chlamydia: NEGATIVE
NEISSERIA GONORRHEA: NEGATIVE

## 2017-03-11 LAB — RPR: RPR Ser Ql: NONREACTIVE

## 2017-03-11 LAB — C. TRACHOMATIS/N. GONORRHOEAE RNA
C. trachomatis RNA, TMA: NOT DETECTED
N. gonorrhoeae RNA, TMA: NOT DETECTED

## 2017-03-11 LAB — HIV ANTIBODY (ROUTINE TESTING W REFLEX): HIV: NONREACTIVE

## 2017-03-11 NOTE — Telephone Encounter (Signed)
Received VM from patient.   Patient was in office on 03/10/2017 for STD check with PA. Reports that he is deeply concerned and thus went to ER last night to have testing.   Requested to have MD review labs.

## 2017-03-11 NOTE — Telephone Encounter (Signed)
MD reviewed labs that have resulted. No positive labs at this time. Will call with all results once completed.   Advised that if patient has positive exposure to gonorrhea, chlamydia or trichomonas, prophylactic ABTx can be prescribed. Otherwise, repeat labs in 3 months.   Call placed to patient and patient made aware.   States that he does not know for sure if he has been exposed to anything, he just had a feeling.   MD aware. No new medications at this time.

## 2017-03-11 NOTE — Telephone Encounter (Signed)
noted 

## 2017-03-12 LAB — RPR: RPR Ser Ql: NONREACTIVE

## 2017-03-12 LAB — HIV ANTIBODY (ROUTINE TESTING W REFLEX): HIV SCREEN 4TH GENERATION: NONREACTIVE

## 2017-03-14 LAB — HSV 1/2 AB (IGM), IFA W/RFLX TITER
HSV 1 IgM Screen: NEGATIVE
HSV 2 IgM Screen: NEGATIVE

## 2017-03-14 LAB — HSV(HERPES SIMPLEX VRS) I + II AB-IGG: HSV 2 IGG,TYPE SPECIFIC AB: <0.9 index

## 2017-03-31 ENCOUNTER — Ambulatory Visit: Payer: 59 | Admitting: Physician Assistant

## 2017-04-03 ENCOUNTER — Ambulatory Visit (HOSPITAL_COMMUNITY): Admission: EM | Admit: 2017-04-03 | Discharge: 2017-04-03 | Payer: 59 | Source: Home / Self Care

## 2017-04-03 ENCOUNTER — Emergency Department (HOSPITAL_COMMUNITY): Payer: 59

## 2017-04-03 ENCOUNTER — Encounter (HOSPITAL_COMMUNITY): Payer: Self-pay | Admitting: Emergency Medicine

## 2017-04-03 ENCOUNTER — Other Ambulatory Visit: Payer: Self-pay

## 2017-04-03 ENCOUNTER — Emergency Department (HOSPITAL_COMMUNITY)
Admission: EM | Admit: 2017-04-03 | Discharge: 2017-04-03 | Disposition: A | Discharge status: Home / Self Care | Payer: 59 | Attending: Emergency Medicine | Admitting: Emergency Medicine

## 2017-04-03 DIAGNOSIS — R1013 Epigastric pain: Secondary | ICD-10-CM | POA: Diagnosis not present

## 2017-04-03 DIAGNOSIS — Z87891 Personal history of nicotine dependence: Secondary | ICD-10-CM | POA: Diagnosis not present

## 2017-04-03 DIAGNOSIS — Z79899 Other long term (current) drug therapy: Secondary | ICD-10-CM | POA: Insufficient documentation

## 2017-04-03 LAB — CBC
HEMATOCRIT: 40.2 % (ref 39.0–52.0)
HEMOGLOBIN: 13 g/dL (ref 13.0–17.0)
MCH: 20.5 pg — ABNORMAL LOW (ref 26.0–34.0)
MCHC: 32.3 g/dL (ref 30.0–36.0)
MCV: 63.4 fL — AB (ref 78.0–100.0)
Platelets: 217 10*3/uL (ref 150–400)
RBC: 6.34 MIL/uL — ABNORMAL HIGH (ref 4.22–5.81)
RDW: 15.1 % (ref 11.5–15.5)
WBC: 6 10*3/uL (ref 4.0–10.5)

## 2017-04-03 LAB — URINALYSIS, ROUTINE W REFLEX MICROSCOPIC
BILIRUBIN URINE: NEGATIVE
GLUCOSE, UA: NEGATIVE mg/dL
Hgb urine dipstick: NEGATIVE
Ketones, ur: NEGATIVE mg/dL
LEUKOCYTES UA: NEGATIVE
NITRITE: NEGATIVE
PH: 6 (ref 5.0–8.0)
Protein, ur: NEGATIVE mg/dL
SPECIFIC GRAVITY, URINE: 1.025 (ref 1.005–1.030)

## 2017-04-03 LAB — COMPREHENSIVE METABOLIC PANEL
ALBUMIN: 4 g/dL (ref 3.5–5.0)
ALT: 33 U/L (ref 17–63)
ANION GAP: 9 (ref 5–15)
AST: 25 U/L (ref 15–41)
Alkaline Phosphatase: 111 U/L (ref 38–126)
BILIRUBIN TOTAL: 1.1 mg/dL (ref 0.3–1.2)
BUN: 10 mg/dL (ref 6–20)
CO2: 25 mmol/L (ref 22–32)
Calcium: 9.2 mg/dL (ref 8.9–10.3)
Chloride: 104 mmol/L (ref 101–111)
Creatinine, Ser: 0.97 mg/dL (ref 0.61–1.24)
GFR calc Af Amer: >60 mL/min (ref >60)
GFR calc non Af Amer: >60 mL/min (ref >60)
GLUCOSE: 88 mg/dL (ref 65–99)
Potassium: 4 mmol/L (ref 3.5–5.1)
SODIUM: 138 mmol/L (ref 135–145)
TOTAL PROTEIN: 7.1 g/dL (ref 6.5–8.1)

## 2017-04-03 LAB — I-STAT TROPONIN, ED: Troponin i, poc: 0 ng/mL (ref 0.00–0.08)

## 2017-04-03 LAB — LIPASE, BLOOD: Lipase: 33 U/L (ref 11–51)

## 2017-04-03 MED ORDER — PANTOPRAZOLE SODIUM 20 MG PO TBEC: 20.0000 mg | DELAYED_RELEASE_TABLET | Freq: Every day | ORAL | 0 refills | Status: DC

## 2017-04-03 MED ORDER — GI COCKTAIL ~~LOC~~
30.0000 mL | Freq: Once | ORAL | Status: AC
  Administered 2017-04-03: 30 mL via ORAL
  Filled 2017-04-03: qty 30

## 2017-04-03 NOTE — ED Triage Notes (Signed)
Pt to ER for evaluation of 5 days epigastric tightness radiating around abdomen and through to his back. States also intermittent headaches. Denies n/v/diarrhea, a/o x4. VSS.

## 2017-04-03 NOTE — ED Provider Notes (Signed)
[place] EMERGENCY DEPARTMENT Provider Note   CSN: [id number] Arrival date & time: 04/03/17  1343     History   Chief Complaint Chief Complaint  Patient presents with  . Abdominal Pain    HPI Omar Norris is a 32 y.o. male.  HPI 32 year old African-American male with no pertinent past medical history presents to the emergency department today with complaints of epigastric abdominal pain.  Patient states that his symptoms started 5 days ago.  Reports tightness over his epigastric region that radiates to his back.  No history of same.  Patient took some Tums because he thought that it was gas pain without any relief.  The pain has been constant.  States that it feels like he is "hungry" however after he eats this does not relieve the pain.  He states that the pain does not get worse with food.  He has tried no other medications for his symptoms.  Denies any associated nausea, vomiting, diarrhea.  Patient denies any associated chest pain, shortness of breath, lower extremity swelling, cough, fevers.  Nothing makes his symptoms better or worse.  Reports normal bowel movement this morning without any melena or hematochezia.  No history of constipation or diarrhea. Denies urinary symptoms.  Denies chronic NSAID use.  Denies any recent travel, chronic alcohol use or new foods. Past Medical History:  Diagnosis Date  . Beta thalassemia [facility])     Patient Active Problem List   Diagnosis Date Noted  . Lipoma of forehead 09/19/2015  . Seasonal allergies 11/09/2011    History reviewed. No pertinent surgical history.     Home Medications    Prior to Admission medications   Medication Sig Start Date End Date Taking? Authorizing Provider  cyclobenzaprine (FLEXERIL) 10 MG tablet Take 1 tablet (10 mg total) by mouth at bedtime. 03/10/17   Orlena Sheldon, PA-C  gabapentin (NEURONTIN) 100 MG capsule Take 1-2 capsules at bedtime 12/27/16   [place], Modena Nunnery, MD  ibuprofen  (ADVIL,MOTRIN) 600 MG tablet Take 1 tablet (600 mg total) every 8 (eight) hours as needed by mouth. 12/27/16   Alycia Rossetti, MD  pantoprazole (PROTONIX) 20 MG tablet Take 1 tablet (20 mg total) by mouth daily. 04/03/17   Doristine Devoid, PA-C  terbinafine (LAMISIL AT) 1 % cream Apply 1 application topically 2 (two) times daily. 03/10/17   Jeannett Senior, PA-C    Family History Family History  Problem Relation Age of Onset  . Hypertension Mother   . Hyperlipidemia Mother     Social History Social History   Tobacco Use  . Smoking status: Former Research scientist (life sciences)  . Smokeless tobacco: Never Used  Substance Use Topics  . Alcohol use: Yes    Alcohol/week: 1.2 oz    Types: 2 Cans of beer per week    Comment: weekends  . Drug use: No     Allergies   Patient has no known allergies.   Review of Systems Review of Systems  Constitutional: Negative for chills and fever.  HENT: Negative for congestion and sore throat.   Eyes: Negative for visual disturbance.  Respiratory: Negative for cough and shortness of breath.   Cardiovascular: Negative for chest pain.  Gastrointestinal: Positive for abdominal pain (epigastric). Negative for blood in stool, constipation, diarrhea, nausea and vomiting.  Genitourinary: Negative for dysuria, flank pain, frequency, hematuria, scrotal swelling, testicular pain and urgency.  Musculoskeletal: Negative for arthralgias and myalgias.  Skin: Negative for rash.  Neurological: Negative for dizziness, syncope,  weakness, light-headedness, numbness and headaches.  Psychiatric/Behavioral: Negative for sleep disturbance. The patient is not nervous/anxious.      Physical Exam Updated Vital Signs BP 115/68   Pulse 71   Temp 98.9 F (37.2 C) (Oral)   Resp 16   Ht 5\' 9"  (1.753 m)   Wt 89.8 kg (198 lb)   SpO2 100%   BMI 29.24 kg/m   Physical Exam  Constitutional: He is oriented to person, place, and time. He appears well-developed and well-nourished.   Non-toxic appearance. No distress.  HENT:  Head: Normocephalic and atraumatic.  Nose: Nose normal.  Mouth/Throat: Oropharynx is clear and moist.  Eyes: Conjunctivae are normal. Pupils are equal, round, and reactive to light. Right eye exhibits no discharge. Left eye exhibits no discharge.  Neck: Normal range of motion. Neck supple. No JVD present. No tracheal deviation present.  Cardiovascular: Normal rate, regular rhythm, normal heart sounds and intact distal pulses. Exam reveals no gallop and no friction rub.  No murmur heard. Pulses are equal bilaterally in all extremities.  Extremities are all warm to touch.  Pulmonary/Chest: Effort normal and breath sounds normal. No stridor. No respiratory distress. He has no wheezes. He has no rales. He exhibits no tenderness.  No hypoxia or tachypnea.  Abdominal: Soft. Bowel sounds are normal. He exhibits no distension, no ascites and no pulsatile midline mass. There is no hepatosplenomegaly. There is tenderness (mild to deep palpation) in the epigastric area and left upper quadrant. There is no rigidity, no rebound, no guarding, no CVA tenderness, no tenderness at McBurney's point and negative Murphy's sign.  Musculoskeletal: Normal range of motion. He exhibits no tenderness.  No lower extremity edema or calf tenderness.  Lymphadenopathy:    He has no cervical adenopathy.  Neurological: He is alert and oriented to person, place, and time.  Skin: Skin is warm and dry. Capillary refill takes less than 2 seconds. No rash noted. He is not diaphoretic.  Psychiatric: His behavior is normal. Judgment and thought content normal.  Nursing note and vitals reviewed.    ED Treatments / Results  Labs (all labs ordered are listed, but only abnormal results are displayed) Labs Reviewed  CBC - Abnormal; Notable for the following components:      Result Value   RBC 6.34 (*)    MCV 63.4 (*)    MCH 20.5 (*)    All other components within normal limits    LIPASE, BLOOD  COMPREHENSIVE METABOLIC PANEL  URINALYSIS, ROUTINE W REFLEX MICROSCOPIC  I-STAT TROPONIN, ED    EKG  EKG Interpretation  Date/Time:  Sunday April 03 2017 14:01:31 EST Ventricular Rate:  76 PR Interval:  202 QRS Duration: 88 QT Interval:  350 QTC Calculation: 393 R Axis:   70 Text Interpretation:  Normal sinus rhythm Septal infarct , age undetermined Abnormal ECG No significant change since last tracing Confirmed by Alfonzo Beers [phone]) on 04/03/2017 3:05:05 PM       Radiology Dg Chest 2 View  Result Date: 04/03/2017 CLINICAL DATA:  Epigastric tightness extending in the abdomen and through the back. Intermittent headaches. EXAM: CHEST  2 VIEW COMPARISON:  One-view chest x-ray 03/19/2013 FINDINGS: Heart size is normal. Mild pulmonary vascular congestion is present without frank edema. There are no significant effusions. The visualized soft tissues and bony thorax are unremarkable. IMPRESSION: 1. Mild pulmonary vascular congestion without frank edema. 2. No other significant airspace disease. Electronically Signed   By: San Morelle M.D.   On: 04/03/2017 15:06  US Abdomen Complete  Result Date: 04/03/2017 CLINICAL DATA:  Five day history of epigastric region pain EXAM: ABDOMEN ULTRASOUND COMPLETE COMPARISON:  None. FINDINGS: Gallbladder: No gallstones or wall thickening visualized. There is no pericholecystic fluid. No sonographic Murphy sign noted by sonographer. Common bile duct: Diameter: 3 mm. No intrahepatic, common hepatic, or common bile duct dilatation. Liver: No focal lesion identified. Within normal limits in parenchymal echogenicity. Portal vein is patent on color Doppler imaging with normal direction of blood flow towards the liver. IVC: No abnormality visualized. Pancreas: No pancreatic mass or inflammatory focus. Spleen: Size and appearance within normal limits. Right Kidney: Length: 11.7 cm. Echogenicity within normal limits. No mass or  hydronephrosis visualized. Left Kidney: Length: 9.8 cm. Echogenicity within normal limits. No mass or hydronephrosis visualized. Abdominal aorta: No aneurysm visualized. Other findings: No appreciable ascites. IMPRESSION: Study within normal limits. Electronically Signed   By: Lowella Grip III M.D.   On: 04/03/2017 16:50    Procedures Procedures (including critical care time)  Medications Ordered in ED Medications  gi cocktail (Maalox,Lidocaine,Donnatal) (30 mLs Oral Given 04/03/17 1555)     Initial Impression / Assessment and Plan / ED Course  I have reviewed the triage vital signs and the nursing notes.  Pertinent labs & imaging results that were available during my care of the patient were reviewed by me and considered in my medical decision making (see chart for details).     Patient presents to the ED for evaluation of epigastric abdominal pain that started 5 days ago.  No history of same.  Denies any associated nausea, vomiting, diarrhea, chest pain, shortness of breath, cough, fevers.  Patient is overall well-appearing and nontoxic.  Vital signs are very reassuring in the ED.  Patient is afebrile, no tachycardia, no hypotension noted.  On exam lungs clear to auscultation bilaterally.  Heart regular rate and rhythm.  No lower extremity edema or calf tenderness.  Patient has some mild epigastric discomfort to palpation along with left upper quadrant tenderness with deep palpation.  No signs of peritonitis.  No CVA tenderness.  Lab work is very reassuring in the ED.  No leukocytosis.  Hemoglobin at patient's baseline.  Electrolytes are reassuring.  Normal liver functions.  Normal lipase.  UA shows no signs of infection.  Troponin is negative.  Chest x-ray does show some mild vascular congestion but no frank pulmonary edema.  Patient has no significant signs of fluid overload.  Denies any associated shortness of breath or cough.  EKG shows normal sinus rhythm without any change from  prior tracings and no signs of ischemia.  Ultrasound was obtained given the upper quadrant abdominal discomfort that shows no acute findings noted.  Patient's pain treated with GI cocktail in the ED with improvement in his symptoms.  Repeat abdominal exam shows no signs of peritonitis.  No focal tenderness noted.  Tolerate p.o. fluids appropriately.  Clinical presentation does not seem consistent with PE, dissection, pulmonary edema, congestive heart failure, pancreatitis, cholecystitis, obstruction.  Will start patient on PPI and have him follow-up with his primary care doctor for further workup and referral to GI if symptoms persist.  Pt is hemodynamically stable, in NAD, & able to ambulate in the ED. Evaluation does not show pathology that would require ongoing emergent intervention or inpatient treatment. I explained the diagnosis to the patient. Pain has been managed & has no complaints prior to dc. Pt is comfortable with above plan and is stable for discharge at this time. All  questions were answered prior to disposition. Strict return precautions for f/u to the ED were discussed. Encouraged follow up with PCP.   Final Clinical Impressions(s) / ED Diagnoses   Final diagnoses:  Epigastric pain    ED Discharge Orders        Ordered    pantoprazole (PROTONIX) 20 MG tablet  Daily     04/03/17 1722       Aaron Edelman 04/03/17 1729    Pixie Casino, MD 04/03/17 1730

## 2017-04-03 NOTE — ED Notes (Signed)
Patient transported to Ultrasound 

## 2017-04-03 NOTE — ED Notes (Signed)
Pt stable, ambulatory, states understanding of discharge instructions 

## 2017-04-03 NOTE — Discharge Instructions (Signed)
Your abdominal pain is likely from gastritis, reflux or a stomach ulcer. You will need to take the prescribed proton pump inhibitor as directed, and avoid spicy/fatty/acidic foods. Avoid laying down flat within 30 minutes of eating. Avoid NSAIDs like ibuprofen or Aleve on an empty stomach. Follow up with your primary care doctor for further evaluation.  Return to the ER for new or worsening symptoms, any additional concers.   SEEK IMMEDIATE MEDICAL ATTENTION IF YOU DEVELOP ANY OF THE FOLLOWING SYMPTOMS: The pain does not go away or becomes severe.  A temperature above 101 develops.  Repeated vomiting occurs (multiple episodes).  Blood is being passed in stools or vomit (bright red or black tarry stools).  Return also if you develop chest pain, difficulty breathing, dizziness or fainting

## 2017-04-03 NOTE — ED Notes (Signed)
Pt back from x-ray.

## 2017-04-03 NOTE — ED Notes (Signed)
X-ray came to get pt in waiting room and will bring back to room.

## 2017-04-26 ENCOUNTER — Encounter: Payer: Self-pay | Admitting: Family Medicine

## 2017-04-29 ENCOUNTER — Encounter: Payer: Self-pay | Admitting: Family Medicine

## 2017-05-16 ENCOUNTER — Encounter: Payer: Self-pay | Admitting: Physician Assistant

## 2017-05-16 ENCOUNTER — Ambulatory Visit: Payer: 59 | Admitting: Physician Assistant

## 2017-05-16 VITALS — BP 120/84 | HR 92 | Temp 98.2°F | Resp 16 | Wt 208.0 lb

## 2017-05-16 DIAGNOSIS — M5440 Lumbago with sciatica, unspecified side: Secondary | ICD-10-CM | POA: Diagnosis not present

## 2017-05-16 DIAGNOSIS — M542 Cervicalgia: Secondary | ICD-10-CM

## 2017-05-16 MED ORDER — CYCLOBENZAPRINE HCL 10 MG PO TABS: 10.0000 mg | ORAL_TABLET | Freq: Every day | ORAL | 0 refills | Status: DC

## 2017-05-16 MED ORDER — IBUPROFEN 600 MG PO TABS: 600.0000 mg | ORAL_TABLET | Freq: Three times a day (TID) | ORAL | 1 refills | Status: DC | PRN

## 2017-05-16 NOTE — Progress Notes (Signed)
Patient ID: Omar Norris MRN: [id number], DOB: [birth year], 32 y.o. Date of Encounter: 05/16/2017, 12:39 PM    Chief Complaint:  Chief Complaint  Patient presents with  . when turning neck pain in head    x1 week     HPI: 32 y.o. year old male presents with above.   Reports that about a week ago he woke up and had some neck pain.  Says that he "thinks it was the way he was sleeping. "  Says that he had to leave the next morning to go to [place].  He used ibuprofen 600 mg while he was there and that helped a lot and his symptoms resolved.  Points to bilateral posterior neck is area that he feels discomfort especially when he turns his head.  Then this past Thursday and Friday when he woke up he felt the same type of neck discomfort.  Then this morning again felt some neck discomfort when he woke up. Says that he "thinks that it's his pillows".  Says that at one point he did have 2 pillows stacked on top of each other and felt like that was too high but then when he goes to just using one pillow says that that pillow flattens down when he puts his head on it. Says this morning he got in a hot shower and then was able to move his neck better after that. The pain improved a lot with that.   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  He states that he has gotten a job with a different company.  Says that he does not have to do any kind of climbing at this time.  Says that he really does not even have to turn his neck.  Says that everything is operated with a joystick and mirrors etc.   He has had no pain, no numbness, no tingling, no weakness down either of the arms or hands.  Home Meds:   Outpatient Medications Prior to Visit  Medication Sig Dispense Refill  . gabapentin (NEURONTIN) 100 MG capsule Take 1-2 capsules at bedtime 60 capsule 3    . pantoprazole (PROTONIX) 20 MG tablet Take 1 tablet (20 mg total) by mouth daily. 14 tablet 0  . terbinafine (LAMISIL AT) 1 % cream Apply 1 application topically 2 (two) times daily. 30 g 0  . cyclobenzaprine (FLEXERIL) 10 MG tablet Take 1 tablet (10 mg total) by mouth at bedtime. 30 tablet 0  . ibuprofen (ADVIL,MOTRIN) 600 MG tablet Take 1 tablet (600 mg total) every 8 (eight) hours as needed by mouth. 30 tablet 1   No facility-administered medications prior to visit.     Allergies: No Known Allergies    Review of Systems: See HPI for pertinent ROS. All other ROS negative.    Physical Exam: Blood pressure 120/84, pulse 92, temperature 98.2 F (36.8 C), temperature source Oral, resp. rate 16, weight 94.3 kg (208 lb), SpO2 97 %., Body mass index is 30.72 kg/m. General:  AAM. Appears in no acute distress. Neck: Supple. No thyromegaly. No lymphadenopathy.  Mild tenderness with palpation of bilateral neck. Lungs: Clear bilaterally to auscultation without wheezes, rales, or rhonchi. Breathing is unlabored. Heart: Regular rhythm. No murmurs, rubs, or gallops. Msk:  Strength and tone normal for age. Mild tenderness with palpation of bilateral  neck.  5/5 upper extremity strength bilaterally.  5/5 grip strength bilaterally. Extremities/Skin: Warm and dry.  Neuro: Alert and oriented X 3. Moves all extremities spontaneously. Gait is normal. CNII-XII grossly in tact. Psych:  Responds to questions appropriately with a normal affect.     ASSESSMENT AND PLAN:  32 y.o. year old male with  1. Bilateral neck pain Recommend that he continue to use hot water in the shower or heating pad to his neck when he is having the symptoms.  So discussed range of motion and gentle stretches of neck and shoulders.  Discussed that he needs to use pillows that will have his neck at proper alignment.  Needs to be aware of position of neck and cervical spine.  I will refill his ibuprofen and Flexeril to use as  needed for flares.  Reminded him that the Flexeril can cause drowsiness and to only use at when he has 8 hours to sleep.  No up if needed. - ibuprofen (ADVIL,MOTRIN) 600 MG tablet; Take 1 tablet (600 mg total) by mouth every 8 (eight) hours as needed.  Dispense: 30 tablet; Refill: 1 - cyclobenzaprine (FLEXERIL) 10 MG tablet; Take 1 tablet (10 mg total) by mouth at bedtime.  Dispense: 30 tablet; Refill: 0   Signed, [id number] [place], [place], [facility] 05/16/2017 12:39 PM

## 2017-06-21 ENCOUNTER — Ambulatory Visit: Payer: BLUE CROSS/BLUE SHIELD | Admitting: Family Medicine

## 2017-11-30 ENCOUNTER — Ambulatory Visit (INDEPENDENT_AMBULATORY_CARE_PROVIDER_SITE_OTHER): Payer: BLUE CROSS/BLUE SHIELD | Admitting: Physician Assistant

## 2017-11-30 ENCOUNTER — Encounter: Payer: Self-pay | Admitting: Physician Assistant

## 2017-11-30 VITALS — BP 124/72 | HR 91 | Temp 98.3°F | Resp 18 | Ht 68.5 in | Wt 214.0 lb

## 2017-11-30 DIAGNOSIS — Z0000 Encounter for general adult medical examination without abnormal findings: Secondary | ICD-10-CM

## 2017-11-30 DIAGNOSIS — Z23 Encounter for immunization: Secondary | ICD-10-CM | POA: Diagnosis not present

## 2017-11-30 NOTE — Addendum Note (Signed)
Addended by: Vonna Kotyk A on: 11/30/2017 05:00 PM   Modules accepted: Orders

## 2017-11-30 NOTE — Progress Notes (Signed)
Patient ID: Omar Norris MRN: [id number], DOB: 1985-08-25 32 y.o. Date of Encounter: 11/30/2017, 3:26 PM    Chief Complaint: Physical (CPE)  HPI: 32 y.o. y/o male here for CPE.   He reports that when he is at his mom's she tells him that he needs to come to the doctor and get a checkup and make sure his weight is okay etc. Reports that he does want to get a flu shot. Reports that he has no other specific concerns that he wanted to address.  Just thought that he should follow-up for another checkup.   Review of Systems: Consitutional: No fever, chills, fatigue, night sweats, lymphadenopathy, or weight changes. Eyes: No visual changes, eye redness, or discharge. ENT/Mouth: Ears: No otalgia, tinnitus, hearing loss, discharge. Nose: No congestion, rhinorrhea, sinus pain, or epistaxis. Throat: No sore throat, post nasal drip, or teeth pain. Cardiovascular: No CP, palpitations, diaphoresis, DOE, edema, orthopnea, PND. Respiratory: No cough, hemoptysis, SOB, or wheezing. Gastrointestinal: No anorexia, dysphagia, reflux, pain, nausea, vomiting, hematemesis, diarrhea, constipation, BRBPR, or melena. Genitourinary: No dysuria, frequency, urgency, hematuria, incontinence, nocturia, decreased urinary stream, discharge, impotence, or testicular pain/masses. Musculoskeletal: No decreased ROM, myalgias, stiffness, joint swelling, or weakness. Skin: No rash, erythema, lesion changes, pain, warmth, jaundice, or pruritis. Neurological: No headache, dizziness, syncope, seizures, tremors, memory loss, coordination problems, or paresthesias. Psychological: No anxiety, depression, hallucinations, SI/HI. Endocrine: No fatigue, polydipsia, polyphagia, polyuria, or known diabetes. All other systems were reviewed and are otherwise negative.  Past Medical History:  Diagnosis Date  . Beta thalassemia ([place])      History reviewed. No pertinent surgical history.  Home Meds:  Outpatient Medications Prior  to Visit  Medication Sig Dispense Refill  . cyclobenzaprine (FLEXERIL) 10 MG tablet Take 1 tablet (10 mg total) by mouth at bedtime. 30 tablet 0  . gabapentin (NEURONTIN) 100 MG capsule Take 1-2 capsules at bedtime 60 capsule 3  . ibuprofen (ADVIL,MOTRIN) 600 MG tablet Take 1 tablet (600 mg total) by mouth every 8 (eight) hours as needed. 30 tablet 1  . pantoprazole (PROTONIX) 20 MG tablet Take 1 tablet (20 mg total) by mouth daily. 14 tablet 0  . terbinafine (LAMISIL AT) 1 % cream Apply 1 application topically 2 (two) times daily. 30 g 0   No facility-administered medications prior to visit.     Allergies: No Known Allergies  Social History   Socioeconomic History  . Marital status: Married    Spouse name: Not on file  . Number of children: Not on file  . Years of education: Not on file  . Highest education level: Not on file  Occupational History  . Not on file  Social Needs  . Financial resource strain: Not on file  . Food insecurity:    Worry: Not on file    Inability: Not on file  . Transportation needs:    Medical: Not on file    Non-medical: Not on file  Tobacco Use  . Smoking status: Former Research scientist (life sciences)  . Smokeless tobacco: Never Used  Substance and Sexual Activity  . Alcohol use: Yes    Alcohol/week: 2.0 standard drinks    Types: 2 Cans of beer per week    Comment: weekends  . Drug use: No  . Sexual activity: Yes    Birth control/protection: Condom  Lifestyle  . Physical activity:    Days per week: Not on file    Minutes per session: Not on file  . Stress: Not on file  Relationships  . Social connections:    Talks on phone: Not on file    Gets together: Not on file    Attends religious service: Not on file    Active member of club or organization: Not on file    Attends meetings of clubs or organizations: Not on file    Relationship status: Not on file  . Intimate partner violence:    Fear of current or ex partner: Not on file    Emotionally abused: Not on  file    Physically abused: Not on file    Forced sexual activity: Not on file  Other Topics Concern  . Not on file  Social History Narrative  . Not on file    Family History  Problem Relation Age of Onset  . Hypertension Mother   . Hyperlipidemia Mother     Physical Exam: Blood pressure 124/72, pulse 91, temperature 98.3 F (36.8 C), temperature source Oral, resp. rate 18, height 5' 8.5" (1.74 m), weight 97.1 kg, SpO2 97 %.  General: Well developed, well nourished AAM. Appears in no acute distress. HEENT: Normocephalic, atraumatic. Conjunctiva pink, sclera non-icteric. Pupils 2 mm constricting to 1 mm, round, regular, and equally reactive to light and accomodation. EOMI. Internal auditory canal clear. TMs with good cone of light and without pathology. Nasal mucosa pink. Nares are without discharge. No sinus tenderness. Oral mucosa pink. Neck: Supple. Trachea midline. No thyromegaly. Full ROM. No lymphadenopathy. Lungs: Clear to auscultation bilaterally without wheezes, rales, or rhonchi. Breathing is of normal effort and unlabored. Cardiovascular: RRR with S1 S2. No murmurs, rubs, or gallops. Distal pulses 2+ symmetrically. No carotid or abdominal bruits. Abdomen: Soft, non-tender, non-distended with normoactive bowel sounds. No hepatosplenomegaly or masses. No rebound/guarding. No CVA tenderness. No hernias. Musculoskeletal: Full range of motion and 5/5 strength throughout.  Skin: Warm and moist without erythema, ecchymosis, wounds, or rash. Neuro: A+Ox3. CN II-XII grossly intact. Moves all extremities spontaneously. Full sensation throughout. Normal gait.  Psych:  Responds to questions appropriately with a normal affect.   Assessment/Plan:  32 y.o. y/o  male here for CPE   -1. Encounter for preventive health examination  A. Screening Labs: Last lipid panel was 04/28/2016.  This was excellent.  Total cholesterol 129, triglyceride 40, HDL 63, LDL 58. He had a lipid panel done the  year prior to that which also was excellent with similar readings.  At that time total cholesterol was 112, triglyceride 50, HDL 62, LDL 40. He is not fasting today.  I do not think that he needs to return fasting to recheck lipids.  They have been excellent at prior checks. He recently had CBC and CMET on 04/03/2017.  He has a history of beta thalassemia.  Otherwise these labs have been normal.  Therefore do not need to repeat these labs today either.  B. Screening For Prostate Cancer: No indication to start this until later age  C. Screening For Colorectal Cancer:  No indication until age 1  D. Immunizations: Flu------------- he does want to get flu vaccine today.  Flu vaccine given here 11/30/2017 Tetanus------- tetanus is overdue.  He is agreeable to update this today.  Tdap given here 11/30/2017 Pneumococcal------- has no indication to require pneumonia vaccine at this time. Shingrix----not indicated until age 13   Signed:   [address] [place], [place]  11/30/2017 3:26 PM

## 2018-05-10 ENCOUNTER — Telehealth: Payer: Self-pay | Admitting: Family Medicine

## 2018-05-10 DIAGNOSIS — T2662XA Corrosion of cornea and conjunctival sac, left eye, initial encounter: Secondary | ICD-10-CM | POA: Diagnosis not present

## 2018-05-10 NOTE — Telephone Encounter (Signed)
Spoke with patient and informed him that he will have to see and eye doctor for a chemical burn to eye so that he may be cleared to return to work. He verbalized understanding and stated that he will go to his eye doctor and she if they will see him. He stated he did not need a referral.
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
## 2018-06-29 ENCOUNTER — Telehealth: Payer: Self-pay | Admitting: *Deleted

## 2018-06-29 ENCOUNTER — Encounter: Payer: Self-pay | Admitting: Family Medicine

## 2018-06-29 ENCOUNTER — Other Ambulatory Visit: Payer: Self-pay

## 2018-06-29 ENCOUNTER — Ambulatory Visit (INDEPENDENT_AMBULATORY_CARE_PROVIDER_SITE_OTHER): Payer: BLUE CROSS/BLUE SHIELD | Admitting: Family Medicine

## 2018-06-29 VITALS — BP 120/80 | HR 67 | Temp 98.6°F | Resp 17 | Ht 68.5 in | Wt 215.1 lb

## 2018-06-29 DIAGNOSIS — R079 Chest pain, unspecified: Secondary | ICD-10-CM

## 2018-06-29 NOTE — Patient Instructions (Signed)
I believe your pain is musculoskeletal - from muscles and your chest wall, and it does not sound concerning for cardiac cause of pain.     Take Aleve sample given - 2x a day Can take tylenol as well as directed on box or bottle.  See info below and circled concerning symptoms that need IMMEDIATE evaluation.     Chest Wall Pain Chest wall pain is pain in or around the bones and muscles of your chest. Chest wall pain may be caused by:  An injury.  Coughing a lot.  Using your chest and arm muscles too much. Sometimes, the cause may not be known. This pain may take a few weeks or longer to get better. Follow these instructions at home: Managing pain, stiffness, and swelling If told, put ice on the painful area:  Put ice in a plastic bag.  Place a towel between your skin and the bag.  Leave the ice on for 20 minutes, 2-3 times a day.  Activity  Rest as told by your doctor.  Avoid doing things that cause pain. This includes lifting heavy items.  Ask your doctor what activities are safe for you. General instructions   Take over-the-counter and prescription medicines only as told by your doctor.  Do not use any products that contain nicotine or tobacco, such as cigarettes, e-cigarettes, and chewing tobacco. If you need help quitting, ask your doctor.  Keep all follow-up visits as told by your doctor. This is important. Contact a doctor if:  You have a fever.  Your chest pain gets worse.  You have new symptoms. Get help right away if:  You feel sick to your stomach (nauseous) or you throw up (vomit).  You feel sweaty or light-headed.  You have a cough with mucus from your lungs (sputum) or you cough up blood.  You are short of breath. These symptoms may be an emergency. Do not wait to see if the symptoms will go away. Get medical help right away. Call your local emergency services (911 in the [place] Do not drive yourself to the hospital. Summary  Chest wall pain is  pain in or around the bones and muscles of your chest.  It may be treated with ice, rest, and medicines. Your condition may also get better if you avoid doing things that cause pain.  Contact a doctor if you have a fever, chest pain that gets worse, or new symptoms.  Get help right away if you feel light-headed or you get short of breath. These symptoms may be an emergency. This information is not intended to replace advice given to you by your health care provider. Make sure you discuss any questions you have with your health care provider. Document Released: 07/28/2007 Document Revised: 08/11/2017 Document Reviewed: 08/11/2017 Elsevier Interactive Patient Education  2019 [employer] Inc.  Nonspecific Chest Pain Chest pain can be caused by many different conditions. Some causes of chest pain can be life-threatening. These will require treatment right away. Serious causes of chest pain include:  Heart attack.  A tear in the body's main blood vessel.  Redness and swelling (inflammation) around your heart.  Blood clot in your lungs. Other causes of chest pain may not be so serious. These include:  Heartburn.  Anxiety or stress.  Damage to bones or muscles in your chest.  Lung infections. Chest pain can feel like:  Pain or discomfort in your chest.  Crushing, pressure, aching, or squeezing pain.  Burning or tingling.  Dull or  sharp pain that is worse when you move, cough, or take a deep breath.  Pain or discomfort that is also felt in your back, neck, jaw, shoulder, or arm, or pain that spreads to any of these areas. It is hard to know whether your pain is caused by something that is serious or something that is not so serious. So it is important to see your doctor right away if you have chest pain. Follow these instructions at home: Medicines  Take over-the-counter and prescription medicines only as told by your doctor.  If you were prescribed an antibiotic medicine, take  it as told by your doctor. Do not stop taking the antibiotic even if you start to feel better. Lifestyle   Rest as told by your doctor.  Do not use any products that contain nicotine or tobacco, such as cigarettes, e-cigarettes, and chewing tobacco. If you need help quitting, ask your doctor.  Do not drink alcohol.  Make lifestyle changes as told by your doctor. These may include: ? Getting regular exercise. Ask your doctor what activities are safe for you. ? Eating a heart-healthy diet. A diet and nutrition specialist (dietitian) can help you to learn healthy eating options. ? Staying at a healthy weight. ? Treating diabetes or high blood pressure, if needed. ? Lowering your stress. Activities such as yoga and relaxation techniques can help. General instructions  Pay attention to any changes in your symptoms. Tell your doctor about them or any new symptoms.  Avoid any activities that cause chest pain.  Keep all follow-up visits as told by your doctor. This is important. You may need more testing if your chest pain does not go away. Contact a doctor if:  Your chest pain does not go away.  You feel depressed.  You have a fever. Get help right away if:  Your chest pain is worse.  You have a cough that gets worse, or you cough up blood.  You have very bad (severe) pain in your belly (abdomen).  You pass out (faint).  You have either of these for no clear reason: ? Sudden chest discomfort. ? Sudden discomfort in your arms, back, neck, or jaw.  You have shortness of breath at any time.  You suddenly start to sweat, or your skin gets clammy.  You feel sick to your stomach (nauseous).  You throw up (vomit).  You suddenly feel lightheaded or dizzy.  You feel very weak or tired.  Your heart starts to beat fast, or it feels like it is skipping beats. These symptoms may be an emergency. Do not wait to see if the symptoms will go away. Get medical help right away. Call  your local emergency services (911 in the [place] Do not drive yourself to the hospital. Summary  Chest pain can be caused by many different conditions. The cause may be serious and need treatment right away. If you have chest pain, see your doctor right away.  Follow your doctor's instructions for taking medicines and making lifestyle changes.  Keep all follow-up visits as told by your doctor. This includes visits for any further testing if your chest pain does not go away.  Be sure to know the signs that show that your condition has become worse. Get help right away if you have these symptoms. This information is not intended to replace advice given to you by your health care provider. Make sure you discuss any questions you have with your health care provider. Document Released: 07/28/2007 Document  Revised: 08/11/2017 Document Reviewed: 08/11/2017 Elsevier Interactive Patient Education  [employer].

## 2018-06-29 NOTE — Telephone Encounter (Signed)
Received call from patient.   Reports that he has x2 days of L sided chest pain. States that it is not tight or cardiac pain, but feels more like pulled muscle. He does have physically strenuous job. Denies SOB.   Appointment scheduled for eval.

## 2018-06-29 NOTE — Progress Notes (Signed)
Patient ID: Omar Norris, male    DOB: [birth year], 33 y.o.   MRN: [id number]  PCP: Alycia Rossetti, MD  Chief Complaint  Patient presents with  . Chest Pain    Subjective:   Omar Norris is a 33 y.o. male, presents to clinic with CC of left upper chest/pec pain Chest Pain   This is a new problem. The current episode started yesterday. The onset quality is gradual. The problem occurs intermittently. The problem has been unchanged. The pain is present in the lateral region. The pain is at a severity of 5/10. The pain is moderate. Quality: sore, achy. The pain does not radiate. Pertinent negatives include no abdominal pain, back pain, cough, diaphoresis, dizziness, exertional chest pressure, fever, headaches, hemoptysis, irregular heartbeat, leg pain, lower extremity edema, malaise/fatigue, nausea, near-syncope, numbness, orthopnea, palpitations, PND, shortness of breath, sputum production, syncope or weakness. The pain is aggravated by lifting arm (palpation). He has tried nothing for the symptoms. Risk factors include male gender.  His family medical history is significant for hypertension.  Pertinent negatives for family medical history include: no aortic dissection, no CAD, no connective tissue disease, no diabetes, no heart disease, no early MI, no stroke and no sudden death.    Pt had gradual onset pain in the am, reproducible with palpation and movement.   He mentions that he thinks it may be from lifting over his head frequently at work, or possibly some GERD from eating [employer].   Patient Active Problem List   Diagnosis Date Noted  . Lipoma of forehead 09/19/2015  . Seasonal allergies 11/09/2011     Prior to Admission medications   Medication Sig Start Date End Date Taking? Authorizing Provider  cyclobenzaprine (FLEXERIL) 10 MG tablet Take 1 tablet (10 mg total) by mouth at bedtime. Patient not taking: Reported on 06/29/2018 05/16/17   Dena Billet B, PA-C  gabapentin  (NEURONTIN) 100 MG capsule Take 1-2 capsules at bedtime Patient not taking: Reported on 06/29/2018 12/27/16   Alycia Rossetti, MD  ibuprofen (ADVIL,MOTRIN) 600 MG tablet Take 1 tablet (600 mg total) by mouth every 8 (eight) hours as needed. Patient not taking: Reported on 06/29/2018 05/16/17   Dena Billet B, PA-C  pantoprazole (PROTONIX) 20 MG tablet Take 1 tablet (20 mg total) by mouth daily. Patient not taking: Reported on 06/29/2018 04/03/17   Ocie Cornfield T, PA-C  terbinafine (LAMISIL AT) 1 % cream Apply 1 application topically 2 (two) times daily. Patient not taking: Reported on 06/29/2018 03/10/17   Jeannett Senior, PA-C     No Known Allergies   Family History  Problem Relation Age of Onset  . Hypertension Mother   . Hyperlipidemia Mother      Social History   Socioeconomic History  . Marital status: Married    Spouse name: Not on file  . Number of children: Not on file  . Years of education: Not on file  . Highest education level: Not on file  Occupational History  . Not on file  Social Needs  . Financial resource strain: Not on file  . Food insecurity:    Worry: Not on file    Inability: Not on file  . Transportation needs:    Medical: Not on file    Non-medical: Not on file  Tobacco Use  . Smoking status: Former Research scientist (life sciences)  . Smokeless tobacco: Never Used  Substance and Sexual Activity  . Alcohol use: Yes    Alcohol/week: 2.0 standard  drinks    Types: 2 Cans of beer per week    Comment: weekends  . Drug use: No  . Sexual activity: Yes    Birth control/protection: Condom  Lifestyle  . Physical activity:    Days per week: Not on file    Minutes per session: Not on file  . Stress: Not on file  Relationships  . Social connections:    Talks on phone: Not on file    Gets together: Not on file    Attends religious service: Not on file    Active member of club or organization: Not on file    Attends meetings of clubs or organizations: Not on file     Relationship status: Not on file  . Intimate partner violence:    Fear of current or ex partner: Not on file    Emotionally abused: Not on file    Physically abused: Not on file    Forced sexual activity: Not on file  Other Topics Concern  . Not on file  Social History Narrative  . Not on file     Review of Systems  Constitutional: Negative.  Negative for diaphoresis, fever and malaise/fatigue.  HENT: Negative.   Eyes: Negative.   Respiratory: Negative.  Negative for cough, hemoptysis, sputum production and shortness of breath.   Cardiovascular: Positive for chest pain. Negative for palpitations, orthopnea, syncope, PND and near-syncope.  Gastrointestinal: Negative.  Negative for abdominal pain and nausea.  Endocrine: Negative.   Genitourinary: Negative.   Musculoskeletal: Negative.  Negative for back pain.  Skin: Negative.   Allergic/Immunologic: Negative.   Neurological: Negative.  Negative for dizziness, weakness, numbness and headaches.  Hematological: Negative.   Psychiatric/Behavioral: Negative.   All other systems reviewed and are negative.      Objective:    Vitals:   06/29/18 1232  BP: 120/80  Pulse: 67  Resp: 17  Temp: 98.6 F (37 C)  TempSrc: Oral  SpO2: 98%  Weight: 215 lb 2 oz (97.6 kg)  Height: 5' 8.5" (1.74 m)      Physical Exam Vitals signs and nursing note reviewed.  Constitutional:      General: He is not in acute distress.    Appearance: Normal appearance. He is well-developed. He is not ill-appearing, toxic-appearing or diaphoretic.  HENT:     Head: Normocephalic and atraumatic.     Jaw: No trismus.     Right Ear: Tympanic membrane, ear canal and external ear normal.     Left Ear: Tympanic membrane, ear canal and external ear normal.     Nose: No mucosal edema or rhinorrhea.     Right Sinus: No maxillary sinus tenderness or frontal sinus tenderness.     Left Sinus: No maxillary sinus tenderness or frontal sinus tenderness.      Mouth/Throat:     Pharynx: Uvula midline. No oropharyngeal exudate, posterior oropharyngeal erythema or uvula swelling.  Eyes:     General: Lids are normal.     Conjunctiva/sclera: Conjunctivae normal.     Pupils: Pupils are equal, round, and reactive to light.  Neck:     Musculoskeletal: Normal range of motion and neck supple.     Vascular: No JVD.     Trachea: Trachea and phonation normal. No tracheal deviation.  Cardiovascular:     Rate and Rhythm: Normal rate and regular rhythm.  No extrasystoles are present.    Chest Wall: No thrill.     Pulses: Normal pulses.  Radial pulses are 2+ on the right side and 2+ on the left side.       Posterior tibial pulses are 2+ on the right side and 2+ on the left side.     Heart sounds: Normal heart sounds. Heart sounds not distant. No murmur. No friction rub. No gallop.   Pulmonary:     Effort: Pulmonary effort is normal. No tachypnea, accessory muscle usage or respiratory distress.     Breath sounds: Normal breath sounds. No stridor. No decreased breath sounds, wheezing, rhonchi or rales.  Chest:     Chest wall: Tenderness present. No mass, deformity, swelling, crepitus or edema. There is no dullness to percussion.       Comments: Area circled in red notes area ttp Abdominal:     General: Bowel sounds are normal. There is no distension.     Palpations: Abdomen is soft.     Tenderness: There is no abdominal tenderness. There is no guarding or rebound.  Musculoskeletal: Normal range of motion.     Right lower leg: No edema.     Left lower leg: No edema.  Skin:    General: Skin is warm and dry.     Capillary Refill: Capillary refill takes less than 2 seconds.     Findings: No rash.  Neurological:     Mental Status: He is alert and oriented to person, place, and time.     Gait: Gait normal.  Psychiatric:        Speech: Speech normal.        Behavior: Behavior normal.      EKG:   Sinus rhythm 67, normal axis, poor r-wave  progression, no ST elevation or depression     Assessment & Plan:      ICD-10-CM   1. Chest pain, unspecified type R07.9 EKG 12-Lead   reproducible with palpation to left pectoralis, suspect MSK, EKG unremarkable, do not suspect ACS    Tx with rest, NSAIDs and expect to improve gradually in the next week or so. ER precautions for CP printed and reviewed with pt, and he verbalized understanding of office f/up and emergent f/up.  Work note given, pt declined note to give lifting and work restrictions   Delsa Grana, PA-C 06/29/18 12:48 PM

## 2018-06-30 ENCOUNTER — Encounter: Payer: Self-pay | Admitting: Family Medicine

## 2018-07-03 ENCOUNTER — Other Ambulatory Visit: Payer: Self-pay

## 2018-07-03 ENCOUNTER — Ambulatory Visit (HOSPITAL_COMMUNITY)
Admission: RE | Admit: 2018-07-03 | Discharge: 2018-07-03 | Disposition: A | Discharge status: Home / Self Care | Payer: BLUE CROSS/BLUE SHIELD | Source: Ambulatory Visit | Attending: Family Medicine | Admitting: Family Medicine

## 2018-07-03 ENCOUNTER — Telehealth: Payer: Self-pay | Admitting: *Deleted

## 2018-07-03 DIAGNOSIS — M542 Cervicalgia: Secondary | ICD-10-CM | POA: Diagnosis not present

## 2018-07-03 MED ORDER — METHYLPREDNISOLONE 4 MG PO TBPK: ORAL_TABLET | ORAL | 0 refills | Status: DC

## 2018-07-03 MED ORDER — TRAMADOL HCL 50 MG PO TABS: 50.0000 mg | ORAL_TABLET | Freq: Three times a day (TID) | ORAL | 0 refills | Status: AC | PRN

## 2018-07-03 NOTE — Telephone Encounter (Signed)
Pt  Saw PA  Last week May be pinched nerve in neck Order Xray C spine- Dx Neck pain Send medrol dosepak, use flexeril at bedtime  Can also send Ultram 50mg  BID prn pain but can not work with this

## 2018-07-03 NOTE — Telephone Encounter (Signed)
Received call from patient. [phone]~ telephone.   Reports that MSK pain is now noted in L arm and neck. States that he also has numbness to 5th digit on L hand.   Denies SOB, or pressure to chest.   States that he is taking Flexeril at night. States that when he is taking the medication at night, the pain resolves, but it returns during the day when he is working. Reports that he cannot take the flexeril while working.   MD please advise.

## 2018-07-03 NOTE — Telephone Encounter (Signed)
Call placed to patient and patient made aware.   Order placed for X-ray.   Medication pended for MD to approve.

## 2018-09-05 ENCOUNTER — Other Ambulatory Visit: Payer: Self-pay

## 2018-09-05 ENCOUNTER — Encounter: Payer: Self-pay | Admitting: Family Medicine

## 2018-09-05 ENCOUNTER — Ambulatory Visit (INDEPENDENT_AMBULATORY_CARE_PROVIDER_SITE_OTHER): Payer: BC Managed Care – PPO | Admitting: Family Medicine

## 2018-09-05 DIAGNOSIS — J029 Acute pharyngitis, unspecified: Secondary | ICD-10-CM | POA: Diagnosis not present

## 2018-09-05 MED ORDER — AMOXICILLIN 500 MG PO CAPS: 500.0000 mg | ORAL_CAPSULE | Freq: Two times a day (BID) | ORAL | 0 refills | Status: DC

## 2018-09-05 NOTE — Progress Notes (Signed)
Virtual Visit via Telephone Note  I connected with Omar Norris on 09/05/18 at 2:02pm by telephone and verified that I am speaking with the correct person using two identifiers.    Pt location: at home   Physician location:  In office, [facility], Vic Blackbird MD     On call: patient and physician     I discussed the limitations, risks, security and privacy concerns of performing an evaluation and management service by telephone and the availability of in person appointments. I also discussed with the patient that there may be a patient responsible charge related to this service. The patient expressed understanding and agreed to proceed.   History of Present Illness:  Yesterday started with sore throat he states that he was working in a dusty area so initially thought maybe was just allergies so he took Benadryl.  He has not had any sneezing no cough no fever.  He gargles with warm salt water this morning he noticed more erythema and soreness in the throat when swallowing.  He has not noted any pus pockets when I had him look at the back of the throat.  Also had him feel for lymph nodes he did not feel anything.  He states that overall he feels well with the exception of just a sore throat.     Wearing mask/ no known Covid exposure He did see his nurse at work they were concerned for possible strep     Observations/Objective: Unable to visualize  Assessment and Plan: Pharyngitis he does not have any other respiratory symptoms concerning for COVID at this time though we did discuss in detail red flags if he does develop fever body aches cough congestion he needs to leave work in [employer] and call me so we can get him swab for [place].  At this time sounds more of a pharyngitis he does not have the other typical allergy symptoms denies any postnasal drip.  We will go ahead and start amoxicillin twice a day continue with salt water gargling  Follow Up Instructions:  [facility] [place]    I discussed the assessment and treatment plan with the patient. The patient was provided an opportunity to ask questions and all were answered. The patient agreed with the plan and demonstrated an understanding of the instructions.   The patient was advised to call back or seek an in-person evaluation if the symptoms worsen or if the condition fails to improve as anticipated.  I provided 102minutes of non-face-to-face time during this encounter. End time  2:08pm   Vic Blackbird, MD

## 2018-09-25 DIAGNOSIS — F411 Generalized anxiety disorder: Secondary | ICD-10-CM | POA: Diagnosis not present

## 2018-10-02 DIAGNOSIS — F411 Generalized anxiety disorder: Secondary | ICD-10-CM | POA: Diagnosis not present

## 2018-10-09 DIAGNOSIS — F411 Generalized anxiety disorder: Secondary | ICD-10-CM | POA: Diagnosis not present

## 2018-10-12 ENCOUNTER — Other Ambulatory Visit: Payer: Self-pay

## 2018-10-12 ENCOUNTER — Encounter: Payer: Self-pay | Admitting: Podiatry

## 2018-10-12 ENCOUNTER — Ambulatory Visit: Payer: BC Managed Care – PPO | Admitting: Podiatry

## 2018-10-12 VITALS — BP 147/99 | Temp 97.6°F

## 2018-10-12 DIAGNOSIS — L600 Ingrowing nail: Secondary | ICD-10-CM

## 2018-10-12 MED ORDER — NEOMYCIN-POLYMYXIN-HC 3.5-10000-1 OT SOLN: OTIC | 1 refills | Status: DC

## 2018-10-12 NOTE — Patient Instructions (Signed)

## 2018-10-13 NOTE — Progress Notes (Signed)
Subjective:   Patient ID: Omar Norris, male   DOB: 33 y.o.   MRN: [id number]   HPI Patient presents with severely thickened deformed right hallux nail that is painful when pressed and makes shoe gear difficult.  He dropped a weight on it and that started the process and he has tried wider shoes and other modalities.  Patient does not smoke likes to be active   Review of Systems  All other systems reviewed and are negative.       Objective:  Physical Exam Vitals signs and nursing note reviewed.  Constitutional:      Appearance: He is well-developed.  Pulmonary:     Effort: Pulmonary effort is normal.  Musculoskeletal: Normal range of motion.  Skin:    General: Skin is warm.  Neurological:     Mental Status: He is alert.     Neurovascular status intact muscle strength found to be adequate range of motion within normal limits.  Patient is found to have a severely thickened right hallux nail that is dystrophic painful when pressed and make shoe gear difficult and has been present for a long time     Assessment:  Chronic nail disease right hallux with abnormal thickness pain secondary to deformity     Plan:  H&P condition reviewed and recommended nail removal.  I explained procedure risk and patient wants surgery and today I infiltrated the right hallux 60 mg like Marcaine mixture after he signed consent form did sterile prep of the toe and using sterile instrumentation remove the hallux nail exposed matrix and applied phenol for applications 30 seconds followed by alcohol lavage sterile dressing.  Gave instructions on soaks reappoint and also instructed the patient on taking dressing off earlier if any issues were to occur and wrote prescription for drops and encouraged to call with questions

## 2018-10-23 ENCOUNTER — Telehealth: Payer: Self-pay | Admitting: *Deleted

## 2018-10-23 DIAGNOSIS — F411 Generalized anxiety disorder: Secondary | ICD-10-CM | POA: Diagnosis not present

## 2018-10-23 NOTE — Telephone Encounter (Signed)
Pt states he needs clarification of the soaking orders.

## 2018-10-23 NOTE — Telephone Encounter (Signed)
I called pt and told him that he should continue the soaks until the area got a dry hard scab, at about the end of the 3rd week, continue the soaks and drops, may allow the area to air dry when resting or at bedtime. Pt states understanding.

## 2018-11-06 DIAGNOSIS — F411 Generalized anxiety disorder: Secondary | ICD-10-CM | POA: Diagnosis not present

## 2018-12-04 ENCOUNTER — Telehealth: Payer: Self-pay | Admitting: *Deleted

## 2018-12-04 NOTE — Telephone Encounter (Signed)
Received VM from patient.   Reports that he has had some increased chest discomfort, but thinks it is indigestion as he has had more spicy foods recently than he usually consumes. Also states that he had some numbness and tingling in his hands from the increased indigestion.   Call placed to patient to inquire.   Patient reports burning sensation in epigastric region and some burning to the back of his throat after eating spicy sauce. States that he noted the discomfort about the same time he noted tingling in his pinky and thumb.   Advised that reflux will not cause tingling or numbness to fingers/ hands. Advised to use Tums or Pepcid for reflux.   Advised that tingling and numbness can indicate nerve involvement from cervical spine x-ray that showed degenerative disc disease at C4-C5. Advised that we can refer to Orthopedics for further workup. States that he will try the Tramadol he has at home prior to getting referral.   MD made aware.

## 2018-12-04 NOTE — Telephone Encounter (Signed)
noted 

## 2018-12-05 ENCOUNTER — Other Ambulatory Visit: Payer: Self-pay | Admitting: Family Medicine

## 2018-12-05 DIAGNOSIS — M542 Cervicalgia: Secondary | ICD-10-CM

## 2018-12-05 DIAGNOSIS — M1990 Unspecified osteoarthritis, unspecified site: Secondary | ICD-10-CM

## 2018-12-06 ENCOUNTER — Emergency Department (HOSPITAL_COMMUNITY)
Admission: EM | Admit: 2018-12-06 | Discharge: 2018-12-06 | Disposition: A | Discharge status: Home / Self Care | Payer: BC Managed Care – PPO | Attending: Emergency Medicine | Admitting: Emergency Medicine

## 2018-12-06 ENCOUNTER — Emergency Department (HOSPITAL_COMMUNITY): Payer: BC Managed Care – PPO

## 2018-12-06 ENCOUNTER — Encounter (HOSPITAL_COMMUNITY): Payer: Self-pay | Admitting: *Deleted

## 2018-12-06 ENCOUNTER — Other Ambulatory Visit: Payer: Self-pay

## 2018-12-06 DIAGNOSIS — R0789 Other chest pain: Secondary | ICD-10-CM | POA: Insufficient documentation

## 2018-12-06 DIAGNOSIS — M25512 Pain in left shoulder: Secondary | ICD-10-CM | POA: Insufficient documentation

## 2018-12-06 DIAGNOSIS — R200 Anesthesia of skin: Secondary | ICD-10-CM | POA: Diagnosis not present

## 2018-12-06 DIAGNOSIS — Z79899 Other long term (current) drug therapy: Secondary | ICD-10-CM | POA: Diagnosis not present

## 2018-12-06 DIAGNOSIS — Z87891 Personal history of nicotine dependence: Secondary | ICD-10-CM | POA: Diagnosis not present

## 2018-12-06 DIAGNOSIS — R079 Chest pain, unspecified: Secondary | ICD-10-CM | POA: Diagnosis not present

## 2018-12-06 LAB — CBC WITH DIFFERENTIAL/PLATELET
Abs Immature Granulocytes: 0.01 10*3/uL (ref 0.00–0.07)
Basophils Absolute: 0 10*3/uL (ref 0.0–0.1)
Basophils Relative: 0 %
Eosinophils Absolute: 0.2 10*3/uL (ref 0.0–0.5)
Eosinophils Relative: 3 %
HCT: 42.6 % (ref 39.0–52.0)
Hemoglobin: 12.9 g/dL — ABNORMAL LOW (ref 13.0–17.0)
Immature Granulocytes: 0 %
Lymphocytes Relative: 29 %
Lymphs Abs: 1.8 10*3/uL (ref 0.7–4.0)
MCH: 19.4 pg — ABNORMAL LOW (ref 26.0–34.0)
MCHC: 30.3 g/dL (ref 30.0–36.0)
MCV: 64 fL — ABNORMAL LOW (ref 80.0–100.0)
Monocytes Absolute: 0.5 10*3/uL (ref 0.1–1.0)
Monocytes Relative: 8 %
Neutro Abs: 3.7 10*3/uL (ref 1.7–7.7)
Neutrophils Relative %: 60 %
Platelets: 249 10*3/uL (ref 150–400)
RBC: 6.66 MIL/uL — ABNORMAL HIGH (ref 4.22–5.81)
RDW: 17.3 % — ABNORMAL HIGH (ref 11.5–15.5)
WBC: 6.3 10*3/uL (ref 4.0–10.5)
nRBC: 0 % (ref 0.0–0.2)

## 2018-12-06 LAB — COMPREHENSIVE METABOLIC PANEL
ALT: 33 U/L (ref 0–44)
AST: 22 U/L (ref 15–41)
Albumin: 4.1 g/dL (ref 3.5–5.0)
Alkaline Phosphatase: 109 U/L (ref 38–126)
Anion gap: 7 (ref 5–15)
BUN: 12 mg/dL (ref 6–20)
CO2: 27 mmol/L (ref 22–32)
Calcium: 9 mg/dL (ref 8.9–10.3)
Chloride: 104 mmol/L (ref 98–111)
Creatinine, Ser: 1.1 mg/dL (ref 0.61–1.24)
GFR calc Af Amer: >60 mL/min (ref >60)
GFR calc non Af Amer: >60 mL/min (ref >60)
Glucose, Bld: 100 mg/dL — ABNORMAL HIGH (ref 70–99)
Potassium: 3.7 mmol/L (ref 3.5–5.1)
Sodium: 138 mmol/L (ref 135–145)
Total Bilirubin: 1.1 mg/dL (ref 0.3–1.2)
Total Protein: 7 g/dL (ref 6.5–8.1)

## 2018-12-06 LAB — LIPASE, BLOOD: Lipase: 26 U/L (ref 11–51)

## 2018-12-06 LAB — D-DIMER, QUANTITATIVE: D-Dimer, Quant: <0.27 ug/mL-FEU (ref 0.00–0.50)

## 2018-12-06 LAB — TROPONIN I (HIGH SENSITIVITY): Troponin I (High Sensitivity): <2 ng/L (ref <18)

## 2018-12-06 MED ORDER — LIDOCAINE VISCOUS HCL 2 % MT SOLN
15.0000 mL | Freq: Once | OROMUCOSAL | Status: AC
  Administered 2018-12-06: 15 mL via ORAL
  Filled 2018-12-06: qty 15

## 2018-12-06 MED ORDER — MELOXICAM 7.5 MG PO TABS: 7.5000 mg | ORAL_TABLET | Freq: Two times a day (BID) | ORAL | 0 refills | Status: DC | PRN

## 2018-12-06 MED ORDER — ALUM & MAG HYDROXIDE-SIMETH 200-200-20 MG/5ML PO SUSP
30.0000 mL | Freq: Once | ORAL | Status: AC
  Administered 2018-12-06: 30 mL via ORAL
  Filled 2018-12-06: qty 30

## 2018-12-06 NOTE — Discharge Instructions (Addendum)
There is no evidence of heart attack or blood clot in the lung.  Make sure you take your Prilosec every day.  You may take anti-inflammatories as needed for the numbness in your fingertips.  Any anti-inflammatories you take can make your acid reflux worse.  Avoid alcohol, caffeine, spicy foods. Follow-up with your doctor.  Return to the ED if your chest pain becomes exertional, associated with shortness of breath, fever, vomiting, any concerns.

## 2018-12-06 NOTE — ED Provider Notes (Signed)
I have taken change of shift signout, the patient is in no distress, repeat EKG is totally unremarkable and similar to old EKGs.  Troponin was negative, imaging has been performed and shows no acute findings.  There is no signs of significant cervical spine cause of the patient's neurologic symptoms in his fingers, his chest pain has been going on for 2 days and with a negative troponin this is very unlikely to be cardiac in origin, he will be referred to his family doctor for ongoing evaluation.  I do not think he needs an MRI at this time as he has no weakness  He has no reproducible ttp on the chest and on my exam had no numbness in the fingers on the L (claims 4th and 5th finger are slightly n umb ont he L).  Normal strength and normal motor function of the R/M/U nerves.    I have personally looked at the 2 view chest x-ray as well as the cervical spine series and see no signs of trauma injury significant arthritis or other abnormalities.  The x-ray of the chest looks normal with normal lungs, cardiac contours and mediastinum.  Awaiting radiology read prior to discharge  I made the patient aware of all of his results and he is stable for discharge and willing to follow-up in the outpatient setting.  We will start an anti-inflammatory.   EKG Interpretation  Date/Time:  Wednesday December 06 2018 08:02:23 EDT Ventricular Rate:  64 PR Interval:    QRS Duration: 83 QT Interval:  359 QTC Calculation: 371 R Axis:   33 Text Interpretation:  Sinus rhythm Prolonged PR interval Anterior infarct, old  compared with prior - no sig changes Confirmed by Noemi Chapel [phone]) on 12/06/2018 8:09:20 AM      Results for orders placed or performed during the hospital encounter of 12/06/18  CBC with Differential/Platelet  Result Value Ref Range   WBC 6.3 4.0 - 10.5 K/uL   RBC 6.66 (H) 4.22 - 5.81 MIL/uL   Hemoglobin 12.9 (L) 13.0 - 17.0 g/dL   HCT 42.6 39.0 - 52.0 %   MCV 64.0 (L) 80.0 - 100.0 fL   MCH  19.4 (L) 26.0 - 34.0 pg   MCHC 30.3 30.0 - 36.0 g/dL   RDW 17.3 (H) 11.5 - 15.5 %   Platelets 249 150 - 400 K/uL   nRBC 0.0 0.0 - 0.2 %   Neutrophils Relative % 60 %   Neutro Abs 3.7 1.7 - 7.7 K/uL   Lymphocytes Relative 29 %   Lymphs Abs 1.8 0.7 - 4.0 K/uL   Monocytes Relative 8 %   Monocytes Absolute 0.5 0.1 - 1.0 K/uL   Eosinophils Relative 3 %   Eosinophils Absolute 0.2 0.0 - 0.5 K/uL   Basophils Relative 0 %   Basophils Absolute 0.0 0.0 - 0.1 K/uL   Immature Granulocytes 0 %   Abs Immature Granulocytes 0.01 0.00 - 0.07 K/uL  Comprehensive metabolic panel  Result Value Ref Range   Sodium 138 135 - 145 mmol/L   Potassium 3.7 3.5 - 5.1 mmol/L   Chloride 104 98 - 111 mmol/L   CO2 27 22 - 32 mmol/L   Glucose, Bld 100 (H) 70 - 99 mg/dL   BUN 12 6 - 20 mg/dL   Creatinine, Ser 1.10 0.61 - 1.24 mg/dL   Calcium 9.0 8.9 - 10.3 mg/dL   Total Protein 7.0 6.5 - 8.1 g/dL   Albumin 4.1 3.5 - 5.0 g/dL  AST 22 15 - 41 U/L   ALT 33 0 - 44 U/L   Alkaline Phosphatase 109 38 - 126 U/L   Total Bilirubin 1.1 0.3 - 1.2 mg/dL   GFR calc non Af Amer >60 >60 mL/min   GFR calc Af Amer >60 >60 mL/min   Anion gap 7 5 - 15  Lipase, blood  Result Value Ref Range   Lipase 26 11 - 51 U/L  Troponin I (High Sensitivity)  Result Value Ref Range   Troponin I (High Sensitivity) <2.0 <18 ng/L   Dg Chest 2 View  Result Date: 12/06/2018 CLINICAL DATA:  Chest pain EXAM: CHEST - 2 VIEW COMPARISON:  April 03, 2017 FINDINGS: Lungs are clear. Heart size and pulmonary vascularity are normal. No adenopathy. No pneumothorax. No bone lesions. IMPRESSION: No edema or consolidation. Electronically Signed   By: Lowella Grip III M.D.   On: 12/06/2018 08:18   Dg Cervical Spine Complete  Result Date: 12/06/2018 CLINICAL DATA:  Left upper extremity numbness EXAM: CERVICAL SPINE - COMPLETE 4+ VIEW COMPARISON:  Jul 03, 2018 FINDINGS: Frontal, lateral, open-mouth odontoid, and bilateral oblique views were  obtained. There is no fracture or spondylolisthesis. Prevertebral soft tissues and predental space regions are normal. Disc spaces appear unremarkable. There is no appreciable exit foraminal narrowing on the oblique views. Lung apices are clear. IMPRESSION: No fracture or spondylolisthesis. No appreciable disc space narrowing or facet arthropathy. Electronically Signed   By: Lowella Grip III M.D.   On: 12/06/2018 08:20     Final diagnoses:  Atypical chest pain  Finger numbness      Noemi Chapel, MD 12/06/18 [phone]

## 2018-12-06 NOTE — ED Triage Notes (Signed)
Pt c/o mid chest pain that radiates up his chest to his shoulder and down his left arm; pt states he now has numbness to his pinky and ring finger on that hand

## 2018-12-06 NOTE — ED Provider Notes (Signed)
[facility] EMERGENCY DEPARTMENT Provider Note   CSN: [id number] Arrival date & time: 12/06/18  0601     History   Chief Complaint Chief Complaint  Patient presents with  . Chest Pain    HPI Omar Norris is a 33 y.o. male.     Patient with history of acid reflux disease, "arthritis in my neck at C4" presenting with left-sided chest pain that has been constant for the past 2 days.  Reports pain and pressure in the left side of his chest that radiates into his left shoulder.  Denies any pain in his left arm but does have some numbness and tingling to the fourth and fifth digits of his left hand.  No weakness.  The pain is similar to when he was diagnosed with acid reflux.  He did have some pizza yesterday and took his Prilosec which he does not take on a regular basis but did not notice any relief.  The pain is not exertional or pleuritic.  There is no shortness of breath, nausea, vomiting, diaphoresis, fever, chills, cough.  No leg pain or leg swelling.  There is no pain in his back.  There is no weakness in his arm.  His ex-wife who is a nurse told him to come get checked out because of the numbness in his fingers.  The history is provided by the patient.  Chest Pain Associated symptoms: numbness   Associated symptoms: no abdominal pain, no cough, no dizziness, no fever, no headache, no nausea, no shortness of breath, no vomiting and no weakness     Past Medical History:  Diagnosis Date  . Beta thalassemia [facility])     Patient Active Problem List   Diagnosis Date Noted  . Lipoma of forehead 09/19/2015  . Seasonal allergies 11/09/2011    History reviewed. No pertinent surgical history.      Home Medications    Prior to Admission medications   Medication Sig Start Date End Date Taking? Authorizing Provider  amoxicillin (AMOXIL) 500 MG capsule Take 1 capsule (500 mg total) by mouth 2 (two) times daily. 09/05/18   Alycia Rossetti, MD  neomycin-polymyxin-hydrocortisone  (CORTISPORIN) OTIC solution Apply 1-2 drops to toe BID after soaking 10/12/18   Wallene Huh, DPM    Family History Family History  Problem Relation Age of Onset  . Hypertension Mother   . Hyperlipidemia Mother     Social History Social History   Tobacco Use  . Smoking status: Former Research scientist (life sciences)  . Smokeless tobacco: Never Used  Substance Use Topics  . Alcohol use: Yes    Alcohol/week: 2.0 standard drinks    Types: 2 Cans of beer per week    Comment: weekends  . Drug use: No     Allergies   Patient has no known allergies.   Review of Systems Review of Systems  Constitutional: Negative for activity change, appetite change and fever.  HENT: Negative for congestion and rhinorrhea.   Eyes: Negative for visual disturbance.  Respiratory: Positive for chest tightness. Negative for cough and shortness of breath.   Cardiovascular: Positive for chest pain.  Gastrointestinal: Negative for abdominal pain, nausea and vomiting.  Genitourinary: Negative for dysuria and hematuria.  Musculoskeletal: Negative for arthralgias and myalgias.  Skin: Negative for wound.  Neurological: Positive for numbness. Negative for dizziness, weakness, light-headedness and headaches.    all other systems are negative except as noted in the HPI and PMH.    Physical Exam Updated Vital Signs BP 137/90 (  BP Location: Left Arm)   Pulse 73   Temp 98.1 F (36.7 C) (Oral)   Resp (!) 24   Ht 5\' 9"  (1.753 m)   Wt 95.3 kg   SpO2 100%   BMI 31.01 kg/m   Physical Exam Vitals signs and nursing note reviewed.  Constitutional:      General: He is not in acute distress.    Appearance: He is well-developed.  HENT:     Head: Normocephalic and atraumatic.     Mouth/Throat:     Pharynx: No oropharyngeal exudate.  Eyes:     Conjunctiva/sclera: Conjunctivae normal.     Pupils: Pupils are equal, round, and reactive to light.  Neck:     Musculoskeletal: Normal range of motion and neck supple.     Comments:  No meningismus. Cardiovascular:     Rate and Rhythm: Normal rate and regular rhythm.     Heart sounds: Normal heart sounds. No murmur.  Pulmonary:     Effort: Pulmonary effort is normal. No respiratory distress.     Breath sounds: Normal breath sounds.  Chest:     Chest wall: No tenderness.  Abdominal:     Palpations: Abdomen is soft.     Tenderness: There is no abdominal tenderness. There is no guarding or rebound.  Musculoskeletal: Normal range of motion.        General: No tenderness.  Skin:    General: Skin is warm.     Capillary Refill: Capillary refill takes less than 2 seconds.  Neurological:     General: No focal deficit present.     Mental Status: He is alert and oriented to person, place, and time.     Cranial Nerves: No cranial nerve deficit.     Motor: No abnormal muscle tone.     Coordination: Coordination normal.     Comments: No ataxia on finger to nose bilaterally. No pronator drift. 5/5 strength throughout. CN 2-12 intact.Equal grip strength. Sensation intact.   Subjective paresthesias in fourth and fifth digits of left hand.  Intact grip strength bilaterally,  Psychiatric:        Behavior: Behavior normal.      ED Treatments / Results  Labs (all labs ordered are listed, but only abnormal results are displayed) Labs Reviewed  CBC WITH DIFFERENTIAL/PLATELET - Abnormal; Notable for the following components:      Result Value   RBC 6.66 (*)    Hemoglobin 12.9 (*)    MCV 64.0 (*)    MCH 19.4 (*)    RDW 17.3 (*)    All other components within normal limits  COMPREHENSIVE METABOLIC PANEL  LIPASE, BLOOD  TROPONIN I (HIGH SENSITIVITY)    EKG EKG Interpretation  Date/Time:  Wednesday December 06 2018 08:02:23 EDT Ventricular Rate:  64 PR Interval:    QRS Duration: 83 QT Interval:  359 QTC Calculation: 371 R Axis:   33 Text Interpretation:  Sinus rhythm Prolonged PR interval Anterior infarct, old  compared with prior - no sig changes Confirmed by  Noemi Chapel [phone]) on 12/06/2018 8:09:20 AM   Radiology Dg Chest 2 View  Result Date: 12/06/2018 CLINICAL DATA:  Chest pain EXAM: CHEST - 2 VIEW COMPARISON:  April 03, 2017 FINDINGS: Lungs are clear. Heart size and pulmonary vascularity are normal. No adenopathy. No pneumothorax. No bone lesions. IMPRESSION: No edema or consolidation. Electronically Signed   By: Lowella Grip III M.D.   On: 12/06/2018 08:18   Dg Cervical Spine Complete  Result  Date: 12/06/2018 CLINICAL DATA:  Left upper extremity numbness EXAM: CERVICAL SPINE - COMPLETE 4+ VIEW COMPARISON:  Jul 03, 2018 FINDINGS: Frontal, lateral, open-mouth odontoid, and bilateral oblique views were obtained. There is no fracture or spondylolisthesis. Prevertebral soft tissues and predental space regions are normal. Disc spaces appear unremarkable. There is no appreciable exit foraminal narrowing on the oblique views. Lung apices are clear. IMPRESSION: No fracture or spondylolisthesis. No appreciable disc space narrowing or facet arthropathy. Electronically Signed   By: Lowella Grip III M.D.   On: 12/06/2018 08:20    Procedures Procedures (including critical care time)  Medications Ordered in ED Medications  alum & mag hydroxide-simeth (MAALOX/MYLANTA) 200-200-20 MG/5ML suspension 30 mL (30 mLs Oral Given 12/06/18 0645)    And  lidocaine (XYLOCAINE) 2 % viscous mouth solution 15 mL (15 mLs Oral Given 12/06/18 0645)     Initial Impression / Assessment and Plan / ED Course  I have reviewed the triage vital signs and the nursing notes.  Pertinent labs & imaging results that were available during my care of the patient were reviewed by me and considered in my medical decision making (see chart for details).       2 days of chest pain that is been constant similar to previous episodes of acid reflux.  EKG is nonischemic and unchanged.  Pain is not reproducible, exertional or pleuritic.  Does have paresthesias in his  left fourth and fifth digits which he noticed about the same time.  No motor weakness  Labs with negative troponin. LFTS and lipase normal.   Low suspicion for ACS with negative troponin and 2 days of constant pain. No hypoxia or tachycardia. Doubt PE. Doubt aortic dissection.   CXR and C spine Xray pending at shift change.   Advised patient to take PPI everyday. Numbness in fingers may be 2/2 cervical radiculopathy but no motor weakness and no indication for emergent MRI.   Care transferred to Dr. [name] Heck at shift change.  Final Clinical Impressions(s) / ED Diagnoses   Final diagnoses:  Atypical chest pain  Finger numbness    ED Discharge Orders    None       Cordarrell Sane, Annie Main, MD 12/06/18 [phone]

## 2019-01-31 ENCOUNTER — Other Ambulatory Visit: Payer: Self-pay

## 2019-01-31 ENCOUNTER — Encounter: Payer: Self-pay | Admitting: Family Medicine

## 2019-01-31 ENCOUNTER — Ambulatory Visit: Payer: BC Managed Care – PPO | Admitting: Family Medicine

## 2019-01-31 VITALS — BP 136/76 | HR 68 | Temp 97.6°F | Resp 14 | Ht 69.0 in | Wt 209.0 lb

## 2019-01-31 DIAGNOSIS — R1084 Generalized abdominal pain: Secondary | ICD-10-CM

## 2019-01-31 DIAGNOSIS — Z23 Encounter for immunization: Secondary | ICD-10-CM | POA: Diagnosis not present

## 2019-01-31 DIAGNOSIS — B356 Tinea cruris: Secondary | ICD-10-CM | POA: Diagnosis not present

## 2019-01-31 DIAGNOSIS — Z113 Encounter for screening for infections with a predominantly sexual mode of transmission: Secondary | ICD-10-CM | POA: Diagnosis not present

## 2019-01-31 LAB — URINALYSIS, ROUTINE W REFLEX MICROSCOPIC
Bilirubin Urine: NEGATIVE
Glucose, UA: NEGATIVE
Hgb urine dipstick: NEGATIVE
Ketones, ur: NEGATIVE
Leukocytes,Ua: NEGATIVE
Nitrite: NEGATIVE
Protein, ur: NEGATIVE
Specific Gravity, Urine: 1.02 (ref 1.001–1.03)
pH: 7 (ref 5.0–8.0)

## 2019-01-31 NOTE — Progress Notes (Signed)
   Subjective:    Patient ID: Omar Norris, male    DOB: 07-10-1985, 33 y.o.   MRN: [id number]  Patient presents for Left side, lower abd, groin pain - ?UTI  Patient called the after-hours line this morning around 6 AM.  States that he had been having experiencing some pain in his scrotal region that started on Sunday evening he also had some abdominal discomfort.  He does state that he has had jock itch on and off because he sweats. He did try jock itch cream as he had a rash in the creases, he used lamisil spray/cream  yesterday he picked up Lotrimen AF He denies any penile discharge Denies any urinary symptoms.  No blood in the urine.  No change in his bowel bowels.  His abdominal discomfort is actually resolved. Does have a new sexual partner. He thinks that he caused the discomfort on Sunday when he went [place] the racetrack and had alcohol but did not drink water behind it because he started having upset stomach later on that night   Review Of Systems:  GEN- denies fatigue, fever, weight loss,weakness, recent illness HEENT- denies eye drainage, change in vision, nasal discharge, CVS- denies chest pain, palpitations RESP- denies SOB, cough, wheeze ABD- denies N/V, change in stools,+ abd pain GU- denies dysuria, hematuria, dribbling, incontinence MSK- denies joint pain, muscle aches, injury Neuro- denies headache, dizziness, syncope, seizure activity       Objective:    BP 136/76   Pulse 68   Temp 97.6 F (36.4 C) (Other (Comment))   Resp 14   Ht 5\' 9"  (1.753 m)   Wt 209 lb (94.8 kg)   SpO2 99%   BMI 30.86 kg/m  GEN- NAD, alert and oriented x3, well-appearing CVS- RRR, no murmur RESP-CTAB ABD-NABS,soft,NT,ND GU-penis normal appearance no discharge no rash testes descended bilaterally no scrotal swelling no mass palpated, hyperpigmentation in the inguinal creases EXT- No edema Pulses- Radial2+        Assessment & Plan:      Problem List Items Addressed This Visit     None    Visit Diagnoses    Generalized abdominal pain    -  Primary   Unclear exact cause.  He could have had some discomfort from the alcohol in general.  He does not have any signs of colitis or gastric distress.Marland Kitchen  His urinalysis looks okay.  We will send for culture along with STD check with new partner.  Exam is benign   Relevant Orders   Urinalysis, Routine w reflex microscopic (Completed)   Comprehensive metabolic panel   CBC with Differential   Urine Culture   Tinea cruris       Continue topical Lotrimin AF twice daily.  He is also going to get a [employer] powder to help with maintenance.   Screen for STD (sexually transmitted disease)       Relevant Orders   C. trachomatis/N. gonorrhoeae RNA   HIV Antibody   RPR      Note: This dictation was prepared with Dragon dictation along with smaller phrase technology. Any transcriptional errors that result from this process are unintentional.

## 2019-01-31 NOTE — Patient Instructions (Addendum)
We will call with lab results  Use the card to get the OTC powder F/U pending results

## 2019-01-31 NOTE — Addendum Note (Signed)
Addended by: Shary Decamp B on: 01/31/2019 04:02 PM   Modules accepted: Orders

## 2019-02-01 ENCOUNTER — Encounter: Payer: Self-pay | Admitting: Family Medicine

## 2019-02-01 LAB — CBC WITH DIFFERENTIAL/PLATELET
Absolute Monocytes: 536 cells/uL (ref 200–950)
Basophils Absolute: 32 cells/uL (ref 0–200)
Basophils Relative: 0.4 %
Eosinophils Absolute: 88 cells/uL (ref 15–500)
Eosinophils Relative: 1.1 %
HCT: 42.6 % (ref 38.5–50.0)
Hemoglobin: 12.7 g/dL — ABNORMAL LOW (ref 13.2–17.1)
Lymphs Abs: 1536 cells/uL (ref 850–3900)
MCH: 19.7 pg — ABNORMAL LOW (ref 27.0–33.0)
MCHC: 29.8 g/dL — ABNORMAL LOW (ref 32.0–36.0)
MCV: 65.9 fL — ABNORMAL LOW (ref 80.0–100.0)
MPV: 9.7 fL (ref 7.5–12.5)
Monocytes Relative: 6.7 %
Neutro Abs: 5808 cells/uL (ref 1500–7800)
Neutrophils Relative %: 72.6 %
Platelets: 160 10*3/uL (ref 140–400)
RBC: 6.46 10*6/uL — ABNORMAL HIGH (ref 4.20–5.80)
RDW: 19.3 % — ABNORMAL HIGH (ref 11.0–15.0)
Total Lymphocyte: 19.2 %
WBC: 8 10*3/uL (ref 3.8–10.8)

## 2019-02-01 LAB — C. TRACHOMATIS/N. GONORRHOEAE RNA
C. trachomatis RNA, TMA: NOT DETECTED
N. gonorrhoeae RNA, TMA: NOT DETECTED

## 2019-02-01 LAB — COMPREHENSIVE METABOLIC PANEL
AG Ratio: 2.1 (calc) (ref 1.0–2.5)
ALT: 34 U/L (ref 9–46)
AST: 22 U/L (ref 10–40)
Albumin: 4.2 g/dL (ref 3.6–5.1)
Alkaline phosphatase (APISO): 107 U/L (ref 36–130)
BUN: 10 mg/dL (ref 7–25)
CO2: 26 mmol/L (ref 20–32)
Calcium: 9.3 mg/dL (ref 8.6–10.3)
Chloride: 102 mmol/L (ref 98–110)
Creat: 1.05 mg/dL (ref 0.60–1.35)
Globulin: 2 g/dL (calc) (ref 1.9–3.7)
Glucose, Bld: 66 mg/dL (ref 65–99)
Potassium: 4.4 mmol/L (ref 3.5–5.3)
Sodium: 138 mmol/L (ref 135–146)
Total Bilirubin: 0.7 mg/dL (ref 0.2–1.2)
Total Protein: 6.2 g/dL (ref 6.1–8.1)

## 2019-02-01 LAB — HIV ANTIBODY (ROUTINE TESTING W REFLEX): HIV 1&2 Ab, 4th Generation: NONREACTIVE

## 2019-02-01 LAB — RPR: RPR Ser Ql: NONREACTIVE

## 2019-02-02 ENCOUNTER — Ambulatory Visit: Payer: BC Managed Care – PPO

## 2019-02-02 LAB — URINE CULTURE
MICRO NUMBER:: 1180664
Result:: NO GROWTH
SPECIMEN QUALITY:: ADEQUATE

## 2019-02-05 ENCOUNTER — Telehealth: Payer: Self-pay | Admitting: Family Medicine

## 2019-02-05 DIAGNOSIS — R110 Nausea: Secondary | ICD-10-CM

## 2019-02-05 DIAGNOSIS — R1084 Generalized abdominal pain: Secondary | ICD-10-CM

## 2019-02-05 NOTE — Telephone Encounter (Signed)
Patient scheduled for a recheck on Wednesday per his request.

## 2019-02-05 NOTE — Telephone Encounter (Signed)
Patient called in.  He still having intermittent episodes of abdominal pain now around the navel region.  States it occurred after he drank alcohol the second time was when he smoked a cigar.  He denies any change in his bowel or bladder.  His recent labs came back negative as well as his urinalysis STD check as he was having pain down into the scrotal region at that time.  Unclear where his pain episodes are coming from them then obtain a CT of abdomen pelvis.  Differentials are wide at this time including gallbladder pancreatic etiology

## 2019-02-05 NOTE — Telephone Encounter (Signed)
CT scan was not automatically authorized. I spoke with a clinical nurse reviewer and they are going to send to a MD for review. They are aware that this is STAT. Please let me know what the next steps will be if it is denied.

## 2019-02-05 NOTE — Telephone Encounter (Signed)
See staff message, would ned OV to reassess and then decide on next steps for imaging

## 2019-02-05 NOTE — Telephone Encounter (Signed)
Have not heard a response back from [facility]. Have left a message for patient so that I may get him scheduled for an office visit to be reassessed

## 2019-02-07 ENCOUNTER — Other Ambulatory Visit: Payer: Self-pay

## 2019-02-07 ENCOUNTER — Ambulatory Visit: Payer: BC Managed Care – PPO | Admitting: Family Medicine

## 2019-02-07 ENCOUNTER — Encounter: Payer: Self-pay | Admitting: Family Medicine

## 2019-02-07 VITALS — BP 122/64 | HR 82 | Temp 98.7°F | Resp 12 | Ht 69.0 in | Wt 210.0 lb

## 2019-02-07 DIAGNOSIS — R1084 Generalized abdominal pain: Secondary | ICD-10-CM | POA: Diagnosis not present

## 2019-02-07 DIAGNOSIS — R1013 Epigastric pain: Secondary | ICD-10-CM | POA: Diagnosis not present

## 2019-02-07 LAB — URINALYSIS, ROUTINE W REFLEX MICROSCOPIC
Bacteria, UA: NONE SEEN /HPF
Bilirubin Urine: NEGATIVE
Glucose, UA: NEGATIVE
Hgb urine dipstick: NEGATIVE
Hyaline Cast: NONE SEEN /LPF
Ketones, ur: NEGATIVE
Leukocytes,Ua: NEGATIVE
Nitrite: NEGATIVE
RBC / HPF: NONE SEEN /HPF (ref 0–2)
Specific Gravity, Urine: 1.025 (ref 1.001–1.03)
Squamous Epithelial / HPF: NONE SEEN /HPF (ref <=5)
WBC, UA: NONE SEEN /HPF (ref 0–5)
pH: 7.5 (ref 5.0–8.0)

## 2019-02-07 LAB — MICROSCOPIC MESSAGE

## 2019-02-07 MED ORDER — PANTOPRAZOLE SODIUM 40 MG PO TBEC: 40.0000 mg | DELAYED_RELEASE_TABLET | Freq: Every day | ORAL | 3 refills | Status: DC

## 2019-02-07 NOTE — Patient Instructions (Addendum)
We will call with results Take the protonix 40mg  ( pantoprazole) once a day  F/U pending results

## 2019-02-07 NOTE — Progress Notes (Signed)
   Subjective:    Patient ID: Omar Norris, male    DOB: 04/24/1985, 33 y.o.   MRN: [id number]  Patient presents for Abdominal Pain (pain now at 4- upper abd pain) Patient here to follow-up intermittent abdominal pain.  He was seen on December 9 at that time he had been experiencing pain down to his scrotal area as well as abdominal discomfort over the previous weekend.  The abdominal pain had resolved by that visit.  He did however still have jock itch.  Labs were unremarkable urinalysis unremarkable STD screen was negative.  He was continued on Lotrimin AF twice a day along with powder.  He called back on well 14 states that he continued to have intermittent abdominal pain episodes around the navel region.  States that occur after he was smoking a cigar and then after he drank alcohol.  He denies any change in his bowel or bladder.  There was no pain associated with eating otherwise.  I did obtain tried to obtain a CT of abdomen however insurance declined this noting that he needed an ultrasound first. Today he is here for recheck and to reevaluate his symptoms.  Today he feels cramping, sensation upper abdomen around the navel.  States that he did eat some spicy food at lunchtime and when he does eat certain foods like this he will get the same pain that he was experiencing above.  He does not have any scrotal discomfort at this time.  He has not had any nausea or vomiting episodes.  He is also avoiding alcohol at this time.  He does recall that he had something similar a year or so ago he was given Protonix and that helped and he still has some of this leftover.  Review Of Systems:  GEN- denies fatigue, fever, weight loss,weakness, recent illness HEENT- denies eye drainage, change in vision, nasal discharge, CVS- denies chest pain, palpitations RESP- denies SOB, cough, wheeze ABD- denies N/V, change in stools, +abd pain GU- denies dysuria, hematuria, dribbling, incontinence MSK- denies joint  pain, muscle aches, injury Neuro- denies headache, dizziness, syncope, seizure activity       Objective:    BP 122/64   Pulse 82   Temp 98.7 F (37.1 C) (Temporal)   Resp 12   Ht 5\' 9"  (1.753 m)   Wt 210 lb (95.3 kg)   SpO2 99%   BMI 31.01 kg/m  GEN- NAD, alert and oriented x3 HEENT- PERRL, EOMI, non injected sclera, pink conjunctiva CVS- RRR, no murmur RESP-CTAB ABD-NABS,soft,NT,ND EXT- No edema Pulses- Radial 2+        Assessment & Plan:      Problem List Items Addressed This Visit    None    Visit Diagnoses    Epigastric abdominal pain    -  Primary   Differential diagnoses gastritis, peptic ulcer, GERD, gallbladder etiology less likely pancreatitis based on the intermittent symptoms Pain abdominal ultrasound.  We will also do an H. pylori breath test on him today.  He can then resume the Protonix once a day.  Avoid spicy food fried food alcohol   Relevant Orders   Urinalysis, Routine w reflex microscopic (Completed)   H. pylori breath test   US Abdomen Complete      Note: This dictation was prepared with Dragon dictation along with smaller phrase technology. Any transcriptional errors that result from this process are unintentional.

## 2019-02-08 ENCOUNTER — Ambulatory Visit (HOSPITAL_COMMUNITY)
Admission: RE | Admit: 2019-02-08 | Discharge: 2019-02-08 | Disposition: A | Discharge status: Home / Self Care | Payer: BC Managed Care – PPO | Source: Ambulatory Visit | Attending: Family Medicine | Admitting: Family Medicine

## 2019-02-08 ENCOUNTER — Other Ambulatory Visit: Payer: Self-pay | Admitting: *Deleted

## 2019-02-08 DIAGNOSIS — R1013 Epigastric pain: Secondary | ICD-10-CM

## 2019-02-08 DIAGNOSIS — R1010 Upper abdominal pain, unspecified: Secondary | ICD-10-CM | POA: Diagnosis not present

## 2019-02-08 LAB — H. PYLORI BREATH TEST: H. pylori Breath Test: NOT DETECTED

## 2019-02-08 MED ORDER — PANTOPRAZOLE SODIUM 40 MG PO TBEC: 40.0000 mg | DELAYED_RELEASE_TABLET | Freq: Every day | ORAL | 3 refills | Status: DC

## 2019-02-10 ENCOUNTER — Emergency Department (HOSPITAL_COMMUNITY): Payer: BC Managed Care – PPO

## 2019-02-10 ENCOUNTER — Emergency Department (HOSPITAL_COMMUNITY)
Admission: EM | Admit: 2019-02-10 | Discharge: 2019-02-11 | Disposition: A | Discharge status: Home / Self Care | Payer: BC Managed Care – PPO | Attending: Emergency Medicine | Admitting: Emergency Medicine

## 2019-02-10 ENCOUNTER — Encounter (HOSPITAL_COMMUNITY): Payer: Self-pay | Admitting: Emergency Medicine

## 2019-02-10 ENCOUNTER — Other Ambulatory Visit: Payer: Self-pay

## 2019-02-10 DIAGNOSIS — Z87891 Personal history of nicotine dependence: Secondary | ICD-10-CM | POA: Insufficient documentation

## 2019-02-10 DIAGNOSIS — N50812 Left testicular pain: Secondary | ICD-10-CM

## 2019-02-10 DIAGNOSIS — R2242 Localized swelling, mass and lump, left lower limb: Secondary | ICD-10-CM | POA: Diagnosis not present

## 2019-02-10 DIAGNOSIS — R52 Pain, unspecified: Secondary | ICD-10-CM

## 2019-02-10 LAB — URINALYSIS, ROUTINE W REFLEX MICROSCOPIC
Bilirubin Urine: NEGATIVE
Glucose, UA: NEGATIVE mg/dL
Hgb urine dipstick: NEGATIVE
Ketones, ur: NEGATIVE mg/dL
Leukocytes,Ua: NEGATIVE
Nitrite: NEGATIVE
Protein, ur: NEGATIVE mg/dL
Specific Gravity, Urine: 1.028 (ref 1.005–1.030)
pH: 6 (ref 5.0–8.0)

## 2019-02-10 NOTE — ED Notes (Signed)
Pt transported to US

## 2019-02-10 NOTE — ED Provider Notes (Signed)
[facility] EMERGENCY DEPARTMENT Provider Note   CSN: [id number] Arrival date & time: 02/10/19  2148     History Chief Complaint  Patient presents with  . Groin Swelling    Omar Norris is a 33 y.o. male.  Left testicular pain for the past 24 hours.  Does lots of lifting at work.  Is sexually active.  No urethral discharge.  No inguinal adenopathy.  Severity is moderate.  Nothing makes symptoms better or worse.        Past Medical History:  Diagnosis Date  . Beta thalassemia [facility])     Patient Active Problem List   Diagnosis Date Noted  . Lipoma of forehead 09/19/2015  . Seasonal allergies 11/09/2011    History reviewed. No pertinent surgical history.     Family History  Problem Relation Age of Onset  . Hypertension Mother   . Hyperlipidemia Mother     Social History   Tobacco Use  . Smoking status: Former Research scientist (life sciences)  . Smokeless tobacco: Never Used  Substance Use Topics  . Alcohol use: Yes    Alcohol/week: 2.0 standard drinks    Types: 2 Cans of beer per week    Comment: weekends  . Drug use: No    Home Medications Prior to Admission medications   Medication Sig Start Date End Date Taking? Authorizing Provider  pantoprazole (PROTONIX) 40 MG tablet Take 1 tablet (40 mg total) by mouth daily. 02/08/19   Alycia Rossetti, MD    Allergies    Patient has no known allergies.  Review of Systems   Review of Systems  All other systems reviewed and are negative.   Physical Exam Updated Vital Signs Ht 5\' 9"  (1.753 m)   Wt 95.2 kg   BMI 30.99 kg/m   Physical Exam Vitals and nursing note reviewed.  Constitutional:      Appearance: He is well-developed.  HENT:     Head: Normocephalic and atraumatic.  Eyes:     Conjunctiva/sclera: Conjunctivae normal.  Cardiovascular:     Rate and Rhythm: Normal rate.  Pulmonary:     Effort: Pulmonary effort is normal.  Abdominal:     General: Bowel sounds are normal.     Palpations: Abdomen is soft.   Genitourinary:    Comments: Penis normal.  No lesions.  No obvious inguinal hernia.  Right testicle normal.  Left testicle slightly larger and minimally tender.  Epididymis appears nontender. Musculoskeletal:        General: Normal range of motion.     Cervical back: Neck supple.  Skin:    General: Skin is warm and dry.  Neurological:     General: No focal deficit present.     Mental Status: He is alert and oriented to person, place, and time.  Psychiatric:        Behavior: Behavior normal.     ED Results / Procedures / Treatments   Labs (all labs ordered are listed, but only abnormal results are displayed) Labs Reviewed  URINALYSIS, ROUTINE W REFLEX MICROSCOPIC    EKG None  Radiology No results found.  Procedures Procedures (including critical care time)  Medications Ordered in ED Medications - No data to display  ED Course  I have reviewed the triage vital signs and the nursing notes.  Pertinent labs & imaging results that were available during my care of the patient were reviewed by me and considered in my medical decision making (see chart for details).    MDM Rules/Calculators/A&P  We will obtain testicular ultrasound to rule out torsion.  Discussed with Dr.Mesner. Final Clinical Impression(s) / ED Diagnoses Final diagnoses:  Pain in left testicle    Rx / DC Orders ED Discharge Orders    None       Nat Christen, MD 02/10/19 2236

## 2019-02-10 NOTE — ED Triage Notes (Signed)
Pt here with c/o L. Testicle swelling and pain. 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

## 2019-02-26 ENCOUNTER — Ambulatory Visit: Payer: BC Managed Care – PPO | Attending: Internal Medicine

## 2019-02-26 DIAGNOSIS — Z20822 Contact with and (suspected) exposure to covid-19: Secondary | ICD-10-CM | POA: Diagnosis not present

## 2019-02-27 ENCOUNTER — Other Ambulatory Visit: Payer: BC Managed Care – PPO

## 2019-02-27 LAB — NOVEL CORONAVIRUS, NAA: SARS-CoV-2, NAA: NOT DETECTED

## 2019-03-09 ENCOUNTER — Ambulatory Visit: Payer: BC Managed Care – PPO | Attending: Internal Medicine

## 2019-03-09 ENCOUNTER — Other Ambulatory Visit: Payer: Self-pay

## 2019-03-09 DIAGNOSIS — Z20822 Contact with and (suspected) exposure to covid-19: Secondary | ICD-10-CM

## 2019-03-10 LAB — NOVEL CORONAVIRUS, NAA: SARS-CoV-2, NAA: DETECTED — AB

## 2019-03-12 ENCOUNTER — Telehealth: Payer: Self-pay | Admitting: *Deleted

## 2019-03-12 NOTE — Telephone Encounter (Signed)
noted 

## 2019-03-12 NOTE — Telephone Encounter (Signed)
Received call from patient.   Reports that he was exposed to [place] + persons at work. Reports that on 03/07/2019, he began having Sx- cough, chest congestion, slight chest pressure. Reports that he was tested on 03/09/2019, and noted positive.   States that he feels a lot better and he is treating Sx as they come. Reports that he only has nasal drainage and slight cough at this time.   Reports that work has no sick time, so he is taking short term disability. Advised that appointment will be needed to discuss with PCP to complete forms. Appointment scheduled for Telehealth Visit.

## 2019-03-13 ENCOUNTER — Encounter: Payer: Self-pay | Admitting: Family Medicine

## 2019-03-13 ENCOUNTER — Ambulatory Visit (INDEPENDENT_AMBULATORY_CARE_PROVIDER_SITE_OTHER): Payer: BC Managed Care – PPO | Admitting: Family Medicine

## 2019-03-13 DIAGNOSIS — U071 COVID-19: Secondary | ICD-10-CM

## 2019-03-13 NOTE — Progress Notes (Signed)
Virtual Visit via Telephone Note  I connected with Omar Norris on 03/13/19 at 3:35pm by telephone and verified that I am speaking with the correct person using two identifiers.     Pt location: at home   Physician location:  In office, [facility], Vic Blackbird MD     On call: patient and physician   I discussed the limitations, risks, security and privacy concerns of performing an evaluation and management service by telephone and the availability of in person appointments. I also discussed with the patient that there may be a patient responsible charge related to this service. The patient expressed understanding and agreed to proceed.   History of Present Illness:  Covid -19 positive on 1/15. He believes he became in contact with it at work from his boss was also positive.  He started with headache,sore throat, chest congestion on Thursday. NO SOB/he did go get tested on that Friday and it came back positive on Saturday. He has loose stools no abdominal pain.  Mild body aches.  The sore throat has improved. He has been drinking water/gaterade Using mucinex/tylenol  He has been taking MVI for immune system  His job does not give sick time, he has [place] term disability forms that will need to be completed.   Observations/Objective:  NAD, normal WOB over the phone  Assessment and Plan: COVID-19 infection -continue to push fluids.  The loose stools should resolve on their own.  He is not have any abdominal pain associated.  He does not have any cough or shortness of breath.  He does have a little congestion which he is taking Mucinex and this is working well.  He will be out of work until January 25.  He will forward me any documents that he needs for his short-term disability., Discussed red flags and when to seek care local emergency room or urgent care if he has any difficulty breathing if he is unable to tolerate fluids to keep himself hydrated.  He  voiced understanding  Follow Up Instructions:    I discussed the assessment and treatment plan with the patient. The patient was provided an opportunity to ask questions and all were answered. The patient agreed with the plan and demonstrated an understanding of the instructions.   The patient was advised to call back or seek an in-person evaluation if the symptoms worsen or if the condition fails to improve as anticipated.  I provided 8 minutes of non-face-to-face time during this encounter. End Time: 3:43pm  Vic Blackbird, MD

## 2019-03-22 ENCOUNTER — Telehealth: Payer: Self-pay | Admitting: *Deleted

## 2019-03-22 NOTE — Telephone Encounter (Signed)
Received forms for short term disability for COVID infection. Noted no information on forms completed and HIPPA release was not comppleted.   Call placed to patient.   States that he will complete his section of forms and have ex-wife fax back to [facility].

## 2019-04-13 DIAGNOSIS — F411 Generalized anxiety disorder: Secondary | ICD-10-CM | POA: Diagnosis not present

## 2019-04-16 ENCOUNTER — Other Ambulatory Visit: Payer: Self-pay

## 2019-04-16 ENCOUNTER — Encounter (HOSPITAL_COMMUNITY): Payer: Self-pay | Admitting: Emergency Medicine

## 2019-04-16 ENCOUNTER — Emergency Department (HOSPITAL_COMMUNITY)
Admission: EM | Admit: 2019-04-16 | Discharge: 2019-04-16 | Disposition: A | Discharge status: Home / Self Care | Payer: BC Managed Care – PPO | Attending: Emergency Medicine | Admitting: Emergency Medicine

## 2019-04-16 ENCOUNTER — Emergency Department (HOSPITAL_COMMUNITY): Payer: BC Managed Care – PPO

## 2019-04-16 DIAGNOSIS — Y9389 Activity, other specified: Secondary | ICD-10-CM | POA: Diagnosis not present

## 2019-04-16 DIAGNOSIS — S39012A Strain of muscle, fascia and tendon of lower back, initial encounter: Secondary | ICD-10-CM

## 2019-04-16 DIAGNOSIS — Y999 Unspecified external cause status: Secondary | ICD-10-CM | POA: Diagnosis not present

## 2019-04-16 DIAGNOSIS — Y92410 Unspecified street and highway as the place of occurrence of the external cause: Secondary | ICD-10-CM | POA: Insufficient documentation

## 2019-04-16 DIAGNOSIS — S3992XA Unspecified injury of lower back, initial encounter: Secondary | ICD-10-CM | POA: Diagnosis not present

## 2019-04-16 MED ORDER — IBUPROFEN 800 MG PO TABS
800.0000 mg | ORAL_TABLET | Freq: Once | ORAL | Status: AC
  Administered 2019-04-16: 800 mg via ORAL
  Filled 2019-04-16: qty 1

## 2019-04-16 MED ORDER — IBUPROFEN 800 MG PO TABS: 800.0000 mg | ORAL_TABLET | Freq: Three times a day (TID) | ORAL | 0 refills | Status: DC

## 2019-04-16 MED ORDER — METHOCARBAMOL 500 MG PO TABS: 500.0000 mg | ORAL_TABLET | Freq: Three times a day (TID) | ORAL | 0 refills | Status: DC

## 2019-04-16 MED ORDER — METHOCARBAMOL 500 MG PO TABS
500.0000 mg | ORAL_TABLET | Freq: Once | ORAL | Status: AC
  Administered 2019-04-16: 18:00:00 500 mg via ORAL
  Filled 2019-04-16: qty 1
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
## 2019-04-16 NOTE — Discharge Instructions (Signed)
Alternate ice and heat to your lower back.  Avoid bending and twisting movements or heavy lifting.  Follow-up with your primary doctor for recheck or return to the emergency department for any worsening symptoms.

## 2019-04-18 ENCOUNTER — Other Ambulatory Visit: Payer: Self-pay

## 2019-04-18 ENCOUNTER — Encounter: Payer: Self-pay | Admitting: Family Medicine

## 2019-04-18 ENCOUNTER — Ambulatory Visit (INDEPENDENT_AMBULATORY_CARE_PROVIDER_SITE_OTHER): Payer: Self-pay | Admitting: Family Medicine

## 2019-04-18 DIAGNOSIS — S39012D Strain of muscle, fascia and tendon of lower back, subsequent encounter: Secondary | ICD-10-CM

## 2019-04-18 DIAGNOSIS — S46912D Strain of unspecified muscle, fascia and tendon at shoulder and upper arm level, left arm, subsequent encounter: Secondary | ICD-10-CM

## 2019-04-18 MED ORDER — HYDROCODONE-ACETAMINOPHEN 5-325 MG PO TABS: 1.0000 | ORAL_TABLET | Freq: Four times a day (QID) | ORAL | 0 refills | Status: DC | PRN

## 2019-04-18 NOTE — Patient Instructions (Addendum)
Take the ibuprofen with food  Take pain medicine as needed  Muscle relaxer as needed F/U as needed or if pain does not improve
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
## 2019-04-26 ENCOUNTER — Other Ambulatory Visit: Payer: Self-pay

## 2019-04-26 ENCOUNTER — Ambulatory Visit (INDEPENDENT_AMBULATORY_CARE_PROVIDER_SITE_OTHER): Payer: Self-pay | Admitting: Nurse Practitioner

## 2019-04-26 ENCOUNTER — Encounter: Payer: Self-pay | Admitting: Nurse Practitioner

## 2019-04-26 VITALS — BP 128/82 | HR 91 | Temp 98.7°F | Resp 18 | Ht 69.0 in | Wt 194.6 lb

## 2019-04-26 DIAGNOSIS — S39012D Strain of muscle, fascia and tendon of lower back, subsequent encounter: Secondary | ICD-10-CM

## 2019-04-26 MED ORDER — KETOROLAC TROMETHAMINE 30 MG/ML IJ SOLN
30.0000 mg | Freq: Once | INTRAMUSCULAR | Status: AC
  Administered 2019-04-26: 30 mg via INTRAMUSCULAR
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
## 2019-05-02 ENCOUNTER — Encounter: Payer: Self-pay | Admitting: *Deleted

## 2019-05-28 ENCOUNTER — Ambulatory Visit: Payer: BC Managed Care – PPO | Admitting: Family Medicine

## 2019-05-28 ENCOUNTER — Encounter: Payer: Self-pay | Admitting: Family Medicine

## 2019-05-28 ENCOUNTER — Other Ambulatory Visit: Payer: Self-pay

## 2019-05-28 ENCOUNTER — Telehealth: Payer: Self-pay | Admitting: *Deleted

## 2019-05-28 DIAGNOSIS — M545 Low back pain, unspecified: Secondary | ICD-10-CM

## 2019-05-28 DIAGNOSIS — G8929 Other chronic pain: Secondary | ICD-10-CM | POA: Diagnosis not present

## 2019-05-28 NOTE — Telephone Encounter (Signed)
  Okay to change note, return on Wed the 7th

## 2019-05-28 NOTE — Telephone Encounter (Signed)
Call placed to patient and patient made aware.  

## 2019-05-28 NOTE — Telephone Encounter (Signed)
Received call from patient.   Reports that letter states that he can return to work on 05/29/2019. Inquired as to if he can take off 05/29/2019 for recovery and then return to work on 05/30/2019.  MD please advise.
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
## 2019-05-28 NOTE — Patient Instructions (Signed)
Take the anti-inflammatory Ibuprofen and muscle relaxer as needed  MRI to be scheduled F/U pending results

## 2019-05-30 ENCOUNTER — Telehealth: Payer: Self-pay | Admitting: Family Medicine

## 2019-05-30 NOTE — Telephone Encounter (Signed)
Patient called and still having back pain unable to do his job at work.  MRI is pending.  He cannot take his medications at work as they are sedating.  We will give him the rest of the week off of work.  He states that his employer has less than 50 people employed therefore they do not give out FMLA.

## 2019-08-22 ENCOUNTER — Other Ambulatory Visit: Payer: Self-pay

## 2019-08-22 ENCOUNTER — Encounter: Payer: Self-pay | Admitting: Family Medicine

## 2019-08-22 ENCOUNTER — Ambulatory Visit: Payer: BLUE CROSS/BLUE SHIELD | Admitting: Family Medicine

## 2019-08-22 VITALS — BP 130/62 | HR 66 | Temp 98.2°F | Resp 12 | Ht 69.0 in | Wt 198.0 lb

## 2019-08-22 DIAGNOSIS — Z113 Encounter for screening for infections with a predominantly sexual mode of transmission: Secondary | ICD-10-CM

## 2019-08-22 NOTE — Patient Instructions (Addendum)
Wewill call with lab results  F/U 3-4 months for Physical

## 2019-08-22 NOTE — Progress Notes (Signed)
   Subjective:    Patient ID: Omar Norris, male    DOB: Feb 22, 1986, 34 y.o.   MRN: [id number]  Patient presents for STD Check (in process of divorce)  Pt here for STD check, no symptoms, but he noticed a swelling on lingual frenulum and became concerned He is currently separated from his wife    Review Of Systems:  GEN- denies fatigue, fever, weight loss,weakness, recent illness HEENT- denies eye drainage, change in vision, nasal discharge, CVS- denies chest pain, palpitations RESP- denies SOB, cough, wheeze ABD- denies N/V, change in stools, abd pain GU- denies dysuria, hematuria, dribbling, incontinence MSK- denies joint pain, muscle aches, injury Neuro- denies headache, dizziness, syncope, seizure activity       Objective:    BP 130/62   Pulse 66   Temp 98.2 F (36.8 C) (Temporal)   Resp 12   Ht 5\' 9"  (1.753 m)   Wt 198 lb (89.8 kg)   SpO2 99%   BMI 29.24 kg/m  GEN- NAD, alert and oriented x3 HEENT- PERRL, EOMI, non injected sclera, pink conjunctiva, MMM, oropharynx clear, lingual frenulum, small prominence, no cyst, ulceration, NT  Neck- Supple, no LADB GU- normal penis appearance, no ulcerations noted         Assessment & Plan:      Problem List Items Addressed This Visit    None    Visit Diagnoses    Screen for STD (sexually transmitted disease)    -  Primary   Reassurance given , no concerning lesions at this time beneath tongue if something enlarges or becomes painful schedule with dentist, STD check done at request    Relevant Orders   HIV Antibody (routine testing w rflx)   Hepatitis C antibody   RPR   C. trachomatis/N. gonorrhoeae RNA   HSV(herpes simplex vrs) 1+2 ab-IgG   Trichomonas vaginalis RNA, Ql,Males      Note: This dictation was prepared with Dragon dictation along with smaller phrase technology. Any transcriptional errors that result from this process are unintentional.

## 2019-08-23 LAB — HSV(HERPES SIMPLEX VRS) I + II AB-IGG
HAV 1 IGG,TYPE SPECIFIC AB: <0.9 index
HSV 2 IGG,TYPE SPECIFIC AB: <0.9 index

## 2019-08-23 LAB — RPR: RPR Ser Ql: NONREACTIVE

## 2019-08-23 LAB — HIV ANTIBODY (ROUTINE TESTING W REFLEX): HIV 1&2 Ab, 4th Generation: NONREACTIVE

## 2019-08-23 LAB — HEPATITIS C ANTIBODY
Hepatitis C Ab: NONREACTIVE
SIGNAL TO CUT-OFF: 0.01 (ref <1.00)

## 2019-08-25 LAB — TRICHOMONAS VAGINALIS RNA, QL,MALES: Trichomonas vaginalis RNA: NOT DETECTED

## 2019-08-25 LAB — C. TRACHOMATIS/N. GONORRHOEAE RNA
C. trachomatis RNA, TMA: NOT DETECTED
N. gonorrhoeae RNA, TMA: NOT DETECTED

## 2019-08-31 ENCOUNTER — Other Ambulatory Visit: Payer: Self-pay | Admitting: *Deleted

## 2019-08-31 MED ORDER — MELOXICAM 7.5 MG PO TABS: 7.5000 mg | ORAL_TABLET | Freq: Two times a day (BID) | ORAL | 0 refills | Status: DC | PRN

## 2019-09-06 ENCOUNTER — Telehealth: Payer: Self-pay | Admitting: *Deleted

## 2019-09-06 NOTE — Telephone Encounter (Signed)
Received call from patient.   Reports that he is having some L sided chest pain under breast. Reports that he thinks it's due to his smoking a cigar, but he is unsure.   BP WNL, no HA, tachycardia.   Appointment scheduled.   Advised to go to ER if pain worsens or is accompanied by nausea, heartburn, arm pain, jaw pain, or spike in HR, BP.

## 2019-09-07 ENCOUNTER — Ambulatory Visit (INDEPENDENT_AMBULATORY_CARE_PROVIDER_SITE_OTHER): Payer: BLUE CROSS/BLUE SHIELD | Admitting: Family Medicine

## 2019-09-07 ENCOUNTER — Encounter: Payer: Self-pay | Admitting: Family Medicine

## 2019-09-07 ENCOUNTER — Other Ambulatory Visit: Payer: Self-pay

## 2019-09-07 VITALS — BP 138/82 | HR 88 | Temp 98.7°F | Resp 16 | Ht 69.0 in | Wt 203.0 lb

## 2019-09-07 DIAGNOSIS — Z720 Tobacco use: Secondary | ICD-10-CM | POA: Diagnosis not present

## 2019-09-07 DIAGNOSIS — R079 Chest pain, unspecified: Secondary | ICD-10-CM

## 2019-09-07 NOTE — Patient Instructions (Addendum)
F/U as previous for your  physical Avoid tobacco products We will call with lab results

## 2019-09-07 NOTE — Telephone Encounter (Signed)
Noted  

## 2019-09-07 NOTE — Progress Notes (Signed)
   Subjective:    Patient ID: Omar Norris, male    DOB: 1985-09-25, 34 y.o.   MRN: [id number]  Patient presents for L Sided Chest Pain (x2 days- pain in chest under L breast- no SOB, pain does not radiate)  Pt here with left sided chest discomfort felt like a tightness/soreness in muscle that occurred on tuesday,beneath pec, but would radiate toward shoulder and neck. Then felt in shoulder and a litlte tingling in pinky fingers. It lasted a few minutes then went away, he felt it a little on wed. He does smoke cigars typically  on the weekend  Yesterday he was smoking cigars and had the same pain again. It went away after he put out the cig  No pain with eating  He does have sleep in [address] on his side, in his work truck     No current chest pain    Review Of Systems:  GEN- denies fatigue, fever, weight loss,weakness, recent illness HEENT- denies eye drainage, change in vision, nasal discharge, CVS- denies chest pain, palpitations RESP- denies SOB, cough, wheeze ABD- denies N/V, change in stools, abd pain GU- denies dysuria, hematuria, dribbling, incontinence MSK- denies joint pain, muscle aches, injury Neuro- denies headache, dizziness, syncope, seizure activity       Objective:    BP 138/82   Pulse 88   Temp 98.7 F (37.1 C) (Temporal)   Resp 16   Ht 5\' 9"  (1.753 m)   Wt 203 lb (92.1 kg)   SpO2 98%   BMI 29.98 kg/m  GEN- NAD, alert and oriented x3 HEENT- PERRL, EOMI, non injected sclera, pink conjunctiva, MMM, oropharynx clear Neck- Supple, no thyromegaly, neg spurlings, C spine NT, FROM CVS- RRR, no murmur RESP-CTAB ABD-NABS,soft,NT,ND MSK- FROm upper ext, rotator cuff intact Neuro- CNII-XII intact, no deficits  EXT- No edema Pulses- Radial  2+  EKG- NSR, no ST changes, repolarization noted, compared to previous        Assessment & Plan:      Problem List Items Addressed This Visit    None    Visit Diagnoses    Chest pain, unspecified type    -   Primary   Atypical chest pain, EKG reassuring, bp looks okay, mildly elevated from his baseline, but he has also been gaining weight the past few months. Advised not to smoke He could have some MSK component but no deficit found on exam Advised if recurrent with any SOB,radiation go to ER to be evaluated If more mucsle pain in shoulder he can try NSAID Check lytes   Relevant Orders   CBC with Differential/Platelet   Comprehensive metabolic panel   EKG 35-KTGY (Completed)      Note: This dictation was prepared with Dragon dictation along with smaller phrase technology. Any transcriptional errors that result from this process are unintentional.

## 2019-09-08 LAB — COMPREHENSIVE METABOLIC PANEL
AG Ratio: 2 (calc) (ref 1.0–2.5)
ALT: 31 U/L (ref 9–46)
AST: 24 U/L (ref 10–40)
Albumin: 4.3 g/dL (ref 3.6–5.1)
Alkaline phosphatase (APISO): 97 U/L (ref 36–130)
BUN: 12 mg/dL (ref 7–25)
CO2: 26 mmol/L (ref 20–32)
Calcium: 9.1 mg/dL (ref 8.6–10.3)
Chloride: 105 mmol/L (ref 98–110)
Creat: 1 mg/dL (ref 0.60–1.35)
Globulin: 2.2 g/dL (calc) (ref 1.9–3.7)
Glucose, Bld: 79 mg/dL (ref 65–99)
Potassium: 4.3 mmol/L (ref 3.5–5.3)
Sodium: 141 mmol/L (ref 135–146)
Total Bilirubin: 0.9 mg/dL (ref 0.2–1.2)
Total Protein: 6.5 g/dL (ref 6.1–8.1)

## 2019-09-08 LAB — CBC WITH DIFFERENTIAL/PLATELET
Absolute Monocytes: 442 cells/uL (ref 200–950)
Basophils Absolute: 34 cells/uL (ref 0–200)
Basophils Relative: 0.5 %
Eosinophils Absolute: 101 cells/uL (ref 15–500)
Eosinophils Relative: 1.5 %
HCT: 43.4 % (ref 38.5–50.0)
Hemoglobin: 12.7 g/dL — ABNORMAL LOW (ref 13.2–17.1)
Lymphs Abs: 1327 cells/uL (ref 850–3900)
MCH: 19.8 pg — ABNORMAL LOW (ref 27.0–33.0)
MCHC: 29.3 g/dL — ABNORMAL LOW (ref 32.0–36.0)
MCV: 67.6 fL — ABNORMAL LOW (ref 80.0–100.0)
MPV: 10.6 fL (ref 7.5–12.5)
Monocytes Relative: 6.6 %
Neutro Abs: 4797 cells/uL (ref 1500–7800)
Neutrophils Relative %: 71.6 %
Platelets: 242 10*3/uL (ref 140–400)
RBC: 6.42 10*6/uL — ABNORMAL HIGH (ref 4.20–5.80)
RDW: 18.3 % — ABNORMAL HIGH (ref 11.0–15.0)
Total Lymphocyte: 19.8 %
WBC: 6.7 10*3/uL (ref 3.8–10.8)

## 2019-09-10 ENCOUNTER — Telehealth: Payer: Self-pay | Admitting: *Deleted

## 2019-09-10 NOTE — Telephone Encounter (Signed)
Received call from patient.   Reports that he has been having increased HA in back of neck and head. Reports no relief from APAP/ IBU.   Reports that he has been monitoring BP and noted to be WNL (130/ 80's).  Advised if HA severe and not improving, he needs to be seen. 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

## 2019-09-11 ENCOUNTER — Emergency Department (HOSPITAL_COMMUNITY)
Admission: EM | Admit: 2019-09-11 | Discharge: 2019-09-11 | Disposition: A | Discharge status: Home / Self Care | Payer: Self-pay | Attending: Emergency Medicine | Admitting: Emergency Medicine

## 2019-09-11 ENCOUNTER — Other Ambulatory Visit: Payer: Self-pay

## 2019-09-11 ENCOUNTER — Encounter (HOSPITAL_COMMUNITY): Payer: Self-pay | Admitting: *Deleted

## 2019-09-11 ENCOUNTER — Emergency Department (HOSPITAL_COMMUNITY): Payer: Self-pay

## 2019-09-11 DIAGNOSIS — Z79899 Other long term (current) drug therapy: Secondary | ICD-10-CM | POA: Insufficient documentation

## 2019-09-11 DIAGNOSIS — R0789 Other chest pain: Secondary | ICD-10-CM | POA: Insufficient documentation

## 2019-09-11 DIAGNOSIS — Z87891 Personal history of nicotine dependence: Secondary | ICD-10-CM | POA: Insufficient documentation

## 2019-09-11 DIAGNOSIS — R9431 Abnormal electrocardiogram [ECG] [EKG]: Secondary | ICD-10-CM | POA: Insufficient documentation

## 2019-09-11 LAB — TROPONIN I (HIGH SENSITIVITY): Troponin I (High Sensitivity): 4 ng/L (ref <18)

## 2019-09-11 MED ORDER — LIDOCAINE 5 % EX PTCH: 1.0000 | MEDICATED_PATCH | CUTANEOUS | 0 refills | Status: DC

## 2019-09-11 MED ORDER — KETOROLAC TROMETHAMINE 60 MG/2ML IM SOLN
30.0000 mg | Freq: Once | INTRAMUSCULAR | Status: AC
  Administered 2019-09-11: 30 mg via INTRAMUSCULAR
  Filled 2019-09-11: qty 2

## 2019-09-11 NOTE — ED Provider Notes (Addendum)
[facility] Provider Note   CSN: [id number] Arrival date & time: 09/11/19  1133     History Chief Complaint  Patient presents with  . Chest Pain    Omar Norris is a 34 y.o. male (past medical history of beta thalassemia and tobacco use who presents the emergency department today for chest pain.  Patient was seen by primary care and urgent care last week for the same.  Chest pain is intermittent, on his left side, mainly in his left shoulder.  States that it does not radiate down into his arm but does have some intermittent pinky tingling when this does occur.  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  Had CBC and CMP and EKG done without any abnormalities last week with PCP.  No other blood work done.  Patient presents to the ER today because he started having an episode early  this morning when he started driving.  No cardiac history.  No shortness of breath.  Chest pain is not exertional.  He states that it is worse when he moves his arm in certain directions.  Was diagnosed with musculoskeletal etiology for chest pain at previous visits.  States that the pain is sharp and then will go away on its own without any intervention.  States that he was given Toradol yesterday which has helped a lot.  Denies any nausea, diaphoresis, vomiting, abdominal pain, back pain, vision changes, weakness, other paresthesias, gait abnormalities.  Patient states that he does have intermittent headache when this occurs, in the back of his head.  Is currently having headache now, is mild.  Is not the worst headache he is ever had.  HPI     Past Medical History:  Diagnosis Date  . Beta thalassemia [facility])     Patient Active Problem List   Diagnosis Date Noted  . Tobacco use 09/07/2019  . Lipoma of forehead 09/19/2015  . Seasonal allergies 11/09/2011    History reviewed. No pertinent  surgical history.     Family History  Problem Relation Age of Onset  . Hypertension Mother   . Hyperlipidemia Mother     Social History   Tobacco Use  . Smoking status: Former Smoker    Types: Cigars  . Smokeless tobacco: Never Used  . Tobacco comment: weekends  Vaping Use  . Vaping Use: Some days  Substance Use Topics  . Alcohol use: Yes    Alcohol/week: 2.0 standard drinks    Types: 2 Cans of beer per week    Comment: weekends  . Drug use: No    Home Medications Prior to Admission medications   Medication Sig Start Date End Date Taking? Authorizing Provider  meloxicam (MOBIC) 7.5 MG tablet Take 1 tablet (7.5 mg total) by mouth 2 (two) times daily as needed for pain. 08/31/19  Yes Ironwood, Modena Nunnery, MD  orphenadrine (NORFLEX) 100 MG tablet Take 1 tablet by mouth 2 (two) times daily as needed. 09/10/19 09/24/19 Yes [provider]  ibuprofen (ADVIL) 800 MG tablet Take 1 tablet (800 mg total) by mouth 3 (three) times daily. Take with food Patient not taking: Reported on 09/11/2019 04/16/19   Triplett, Tammy, PA-C  lidocaine (LIDODERM) 5 % Place 1 patch onto the skin daily. Remove & Discard patch within 12 hours or as directed by MD 09/11/19   Alfredia Client, PA-C  Allergies    Patient has no known allergies.  Review of Systems   Review of Systems  Constitutional: Negative for chills, diaphoresis, fatigue and fever.  HENT: Negative for congestion, sore throat and trouble swallowing.   Eyes: Negative for pain and visual disturbance.  Respiratory: Negative for cough, shortness of breath and wheezing.   Cardiovascular: Positive for chest pain. Negative for palpitations and leg swelling.  Gastrointestinal: Negative for abdominal distention, abdominal pain, diarrhea, nausea and vomiting.  Genitourinary: Negative for difficulty urinating.  Musculoskeletal: Negative for back pain, neck pain and neck stiffness.  Skin: Negative for pallor.  Neurological: Positive for  headaches. Negative for dizziness, tremors, seizures, syncope, facial asymmetry, speech difficulty, weakness, light-headedness and numbness.  Psychiatric/Behavioral: Negative for confusion.    Physical Exam Updated Vital Signs BP (!) 141/84   Pulse 97   Temp 98.9 F (37.2 C) (Oral)   Resp 14   Ht 5\' 10"  (1.778 m)   Wt 91.2 kg   SpO2 97%   BMI 28.84 kg/m   Physical Exam Constitutional:      General: He is not in acute distress.    Appearance: Normal appearance. He is not ill-appearing, toxic-appearing or diaphoretic.  HENT:     Mouth/Throat:     Mouth: Mucous membranes are moist.     Pharynx: Oropharynx is clear.  Eyes:     General: No scleral icterus.    Extraocular Movements: Extraocular movements intact.     Pupils: Pupils are equal, round, and reactive to light.  Cardiovascular:     Rate and Rhythm: Normal rate and regular rhythm.     Pulses: Normal pulses.     Heart sounds: Normal heart sounds.  Pulmonary:     Effort: Pulmonary effort is normal. No respiratory distress.     Breath sounds: Normal breath sounds. No stridor. No wheezing, rhonchi or rales.  Chest:     Chest wall: No tenderness.  Abdominal:     General: Abdomen is flat. There is no distension.     Palpations: Abdomen is soft.     Tenderness: There is no abdominal tenderness. There is no guarding or rebound.  Musculoskeletal:        General: No swelling or tenderness. Normal range of motion.     Cervical back: Normal range of motion and neck supple. No rigidity or tenderness.     Right lower leg: No edema.     Left lower leg: No edema.     Comments: Trapezius muscle tenderness noted on left, able to range left arm without any pain passively actively.  Normal strength to bilateral upper extremity.  Normal sensation throughout.  Normal sensation to all fingers.  Normal strength to all fingers.  Radial pulse 2+.  Cap refill less than 2 seconds.  Skin:    General: Skin is warm and dry.     Capillary  Refill: Capillary refill takes less than 2 seconds.     Coloration: Skin is not pale.  Neurological:     General: No focal deficit present.     Mental Status: He is alert and oriented to person, place, and time.     Comments: Alert. Clear speech. No facial droop. CNIII-XII grossly intact. Bilateral upper and lower extremities' sensation grossly intact. 5/5 symmetric strength with grip strength and with plantar and dorsi flexion bilaterally. Negative pronator drift. Negative Romberg sign. Gait is steady and intact   Psychiatric:        Mood and Affect: Mood normal.  Behavior: Behavior normal.     ED Results / Procedures / Treatments   Labs (all labs ordered are listed, but only abnormal results are displayed) Labs Reviewed  TROPONIN I (HIGH SENSITIVITY)    EKG EKG Interpretation  Date/Time:  Tuesday September 11 2019 11:48:13 EDT Ventricular Rate:  87 PR Interval:    QRS Duration: 79 QT Interval:  332 QTC Calculation: 400 R Axis:   63 Text Interpretation: Sinus rhythm Probable anteroseptal infarct, old ST elevation, consider inferior injury Confirmed by Milton Ferguson [phone]) on 09/11/2019 2:53:53 PM   Radiology  DG Chest 2 View  Result Date: 09/11/2019 CLINICAL DATA:  Chest pain. EXAM: CHEST - 2 VIEW COMPARISON:  12/06/2018. FINDINGS: Mediastinum hilar structures normal. Heart size normal. Low lung volumes. Mild bilateral interstitial prominence. Mild pneumonitis cannot be excluded. No pleural effusion or pneumothorax. IMPRESSION: Low lung volumes. Mild bilateral interstitial prominence. Mild pneumonitis cannot be excluded. Electronically Signed   By: Marcello Moores  Register   On: 09/11/2019 14:25    Procedures Procedures (including critical care time)  Medications Ordered in ED Medications  ketorolac (TORADOL) injection 30 mg (has no administration in time range)    ED Course  I have reviewed the triage vital signs and the nursing notes.  Pertinent labs & imaging results  that were available during my care of the patient were reviewed by me and considered in my medical decision making (see chart for details).    MDM Rules/Calculators/A&P                         Omar Norris is a 34 y.o. male  w past medical history of beta thalassemia and tobacco use who presents the emergency department today for chest pain.  Patient was seen twice now for this, diagnosed with MSK etiology.  Patient states that chest pain has been present since early this morning, is not exertional and does not radiate down into his arm.  Patient states that he is also having trapezius pain and headache.  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  Patient recently just got CBC and CMP and EKGs which were negative.  Will add on troponin today and chest x-ray.    Troponin 4, do not think that repeat troponin is necessary at this time, shared decision making since chest pain has been present for one week, last episode longer than 10 hours.  Dr. Roderic Palau was able to see EKG, states that there is no R wave progression but has been unchanged for 6 years, does want him to see a cardiologist.  Did speak to patient about this.  Chest x-ray with questionable pneumonitis, patient without any cough or URI like symptoms or fever, did tell patient that he needs to follow-up with primary care about this or repeat chest x-ray in the next couple of weeks.  Patient agreeable.  Toradol shot given, patient states that he feels better with this upon reassessment.  Patient to be discharged with lidocaine patches, did discuss that this is MSK in nature and that he needs to follow-up with primary care.  Patient agreeable for this. Patient is to be discharged with recommendation to follow up with PCP in regards to today's hospital visit. Chest pain is not likely of cardiac or pulmonary etiology d/t presentation, PERC negative, VSS, no tracheal deviation,  no JVD or new murmur, RRR, breath  sounds equal bilaterally, EKG without acute abnormalities, negative troponin, and negative CXR. Pt has been advised to return to the ED if CP becomes exertional, associated with diaphoresis or nausea, radiates to left jaw/arm, worsens or becomes concerning in any way. Pt appears reliable for follow up and is agreeable to discharge.   I discussed this case with my attending physician who cosigned this note including patient's presenting symptoms, physical exam, and planned diagnostics and interventions. Attending physician stated agreement with plan or made changes to plan which were implemented.     Final Clinical Impression(s) / ED Diagnoses Final diagnoses:  Abnormal EKG  Chest wall pain    Rx / DC Orders ED Discharge Orders         Ordered    lidocaine (LIDODERM) 5 %  Every 24 hours     Discontinue  Reprint     09/11/19 [address] PA-C 09/11/19 1523    Milton Ferguson, MD 09/12/19 [phone]

## 2019-09-11 NOTE — Discharge Instructions (Addendum)
You are seen today for chest pain, as we spoke about this is most likely musculoskeletal in nature.  I want you to use the attached instructions.  I want you to follow-up with your primary care for repeat chest x-ray in the next couple of weeks as we spoke about.  I also want you to follow-up with cardiology for your abnormal EKG, this has been unchanged for the past 6 years however I think that it could be of benefit seeing a cardiologist,.  Please come back to the emergency department for any new or worsening concerning symptoms such as shortness of breath or chest pain on exertion, weakness, worsening pain.  Try and incorporate stretches into your daily routine, trying to take breaks from driving and switch arms if possible.  You can use the Lidoderm patches as needed on your trapezius muscles.

## 2019-09-11 NOTE — ED Triage Notes (Signed)
Pt with left sided cp and left arm for a week.  Seen pcp on Friday, had ekg and blood work done and everything was negative.  Pt with mild tingling to left pinky finger.  Took a muscle relaxer last night with relief but pain has returned.

## 2019-09-13 ENCOUNTER — Telehealth: Payer: Self-pay | Admitting: *Deleted

## 2019-09-13 NOTE — Telephone Encounter (Signed)
Received call from patient.   Reports that he continues to have MSK chest wall pain and HA. Reports that he was seen in ER and all tests returned WNL.   Reports that he feels this may be anxiety related. Requested appointment with PCP.   Appointment scheduled.

## 2019-09-17 ENCOUNTER — Encounter: Payer: Self-pay | Admitting: Family Medicine

## 2019-09-17 ENCOUNTER — Other Ambulatory Visit: Payer: Self-pay

## 2019-09-17 ENCOUNTER — Ambulatory Visit: Payer: Self-pay | Admitting: Family Medicine

## 2019-09-17 VITALS — BP 136/82 | HR 90 | Temp 98.4°F | Resp 14 | Ht 69.0 in | Wt 201.0 lb

## 2019-09-17 DIAGNOSIS — R0789 Other chest pain: Secondary | ICD-10-CM

## 2019-09-17 DIAGNOSIS — R9389 Abnormal findings on diagnostic imaging of other specified body structures: Secondary | ICD-10-CM

## 2019-09-17 DIAGNOSIS — R9431 Abnormal electrocardiogram [ECG] [EKG]: Secondary | ICD-10-CM

## 2019-09-17 DIAGNOSIS — M5412 Radiculopathy, cervical region: Secondary | ICD-10-CM

## 2019-09-17 DIAGNOSIS — Z13220 Encounter for screening for lipoid disorders: Secondary | ICD-10-CM

## 2019-09-17 LAB — BASIC METABOLIC PANEL
BUN: 16 mg/dL (ref 7–25)
CO2: 26 mmol/L (ref 20–32)
Calcium: 9.1 mg/dL (ref 8.6–10.3)
Chloride: 103 mmol/L (ref 98–110)
Creat: 1.06 mg/dL (ref 0.60–1.35)
Glucose, Bld: 80 mg/dL (ref 65–99)
Potassium: 4.6 mmol/L (ref 3.5–5.3)
Sodium: 138 mmol/L (ref 135–146)

## 2019-09-17 LAB — LIPID PANEL
Cholesterol: 135 mg/dL (ref <200)
HDL: 77 mg/dL (ref >=40)
LDL Cholesterol (Calc): 50 mg/dL (calc)
Non-HDL Cholesterol (Calc): 58 mg/dL (calc) (ref <130)
Total CHOL/HDL Ratio: 1.8 (calc) (ref <5.0)
Triglycerides: 29 mg/dL (ref <150)

## 2019-09-17 MED ORDER — METHYLPREDNISOLONE 4 MG PO TBPK: ORAL_TABLET | ORAL | 0 refills | Status: DC

## 2019-09-17 MED ORDER — CYCLOBENZAPRINE HCL 10 MG PO TABS: 10.0000 mg | ORAL_TABLET | Freq: Three times a day (TID) | ORAL | 0 refills | Status: DC | PRN

## 2019-09-17 NOTE — Patient Instructions (Addendum)
Chest xray needed in 3 weeks - you can get the week of August 16th   walk in to [facility] Radiology   Referral to cardiologist   Stop taking meloxicam, Excedrin and Ibuprofen  Take the steroids as prescribed Use biofreeze topically Muscle relaxer if enough time to sleep   We will call with lab results

## 2019-09-17 NOTE — Progress Notes (Signed)
Subjective:    Patient ID: Omar Norris, male    DOB: 09/04/1985, 34 y.o.   MRN: [id number]  Patient presents for ER F/U (chest pressure, HA, possible anxiety)   Pt here for ongoing episodes of left sided chest pressure. He states it feels like a pulled muscle but then at other times he is not sure. Denies any palpitations. Pain was constant so he went to UC, also had radicular symptoms down his left arm with some tingling and numbness and HA assocuiated. 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 CXR did show ? Pneumonitis but he did not have any pulmonary symptoms. Dx with MSK pain, advised to f/u in office, given script for lidoderm as well, but he never picked up   Pain not caused by exertion, can occur just with sitting  He was having some heartburn as well with all the NSAIDS, he took antacid and that helped a lot too    He tried heartburn medicine which helped a little    Denies any significant stressors, that make him feel anxious    Review Of Systems:  GEN- denies fatigue, fever, weight loss,weakness, recent illness HEENT- denies eye drainage, change in vision, nasal discharge, CVS- + chest pain, palpitations RESP- denies SOB, cough, wheeze ABD- denies N/V, change in stools, abd pain GU- denies dysuria, hematuria, dribbling, incontinence MSK- + joint pain, muscle aches, injury Neuro- denies headache, dizziness, syncope, seizure activity       Objective:    BP (!) 136/82   Pulse 90   Temp 98.4 F (36.9 C) (Temporal)   Resp 14   Ht 5\' 9"  (1.753 m)   Wt 201 lb (91.2 kg)   SpO2 97%   BMI 29.68 kg/m  GEN- NAD, alert and oriented x3 HEENT- PERRL, EOMI, non injected sclera, pink conjunctiva, MMM, oropharynx clear Neck- Supple, no  thyromegaly, c spine NT, FROM neg spurlings  CVS- RRR, no murmur RESP-CTAB Chest wall- mild TTP left chest wall MSK- FROM upper ext, rotator cuff in tact, pain with elevation of shoulder in chest wall ABD-NABS,soft,NT,ND EXT- No edema Pulses- Radial, 2+        Assessment & Plan:      Problem List Items Addressed This Visit    None    Visit Diagnoses    Atypical chest pain    -  Primary   more MSK pain, but has abnormal EKG though nothing acute in comparison with past EKG, will have him evaluated by cardiology   Relevant Orders   Ambulatory referral to Cardiology   Lipid panel   Basic metabolic panel   Screening cholesterol level       check lipids, with cardiac risk factors    Relevant Orders   Lipid panel   Cervical radiculopathy       Concern HA and symptoms in left arm due to MSK pain from neck or possibly shoulder, D/C NSAIDS due to overudse and GI symptoms, medrol dosepak, muscle relaxer and topical biofreeze as these help    Relevant Medications   cyclobenzaprine (FLEXERIL) 10 MG tablet   Abnormal chest x-ray       no symptoms, but will repeat CXR in 3 weeks as very difficult to tease out  his symptoms    Relevant Orders   DG Chest 2 View   Abnormal EKG       Relevant Orders   Ambulatory referral to Cardiology      Note: This dictation was prepared with Dragon dictation along with smaller phrase technology. Any transcriptional errors that result from this process are unintentional.

## 2019-10-26 ENCOUNTER — Ambulatory Visit (INDEPENDENT_AMBULATORY_CARE_PROVIDER_SITE_OTHER): Payer: Self-pay | Admitting: Family Medicine

## 2019-10-26 ENCOUNTER — Encounter: Payer: Self-pay | Admitting: Family Medicine

## 2019-10-26 ENCOUNTER — Other Ambulatory Visit: Payer: Self-pay

## 2019-10-26 ENCOUNTER — Ambulatory Visit (HOSPITAL_COMMUNITY)
Admission: RE | Admit: 2019-10-26 | Discharge: 2019-10-26 | Disposition: A | Discharge status: Home / Self Care | Payer: Self-pay | Source: Ambulatory Visit | Attending: Family Medicine | Admitting: Family Medicine

## 2019-10-26 VITALS — BP 138/68 | HR 78 | Temp 97.8°F | Resp 14 | Ht 69.0 in | Wt 205.0 lb

## 2019-10-26 DIAGNOSIS — R9431 Abnormal electrocardiogram [ECG] [EKG]: Secondary | ICD-10-CM

## 2019-10-26 DIAGNOSIS — R9389 Abnormal findings on diagnostic imaging of other specified body structures: Secondary | ICD-10-CM | POA: Insufficient documentation

## 2019-10-26 DIAGNOSIS — R0789 Other chest pain: Secondary | ICD-10-CM

## 2019-10-26 DIAGNOSIS — K30 Functional dyspepsia: Secondary | ICD-10-CM

## 2019-10-26 MED ORDER — ESOMEPRAZOLE MAGNESIUM 20 MG PO CPDR: 20.0000 mg | DELAYED_RELEASE_CAPSULE | Freq: Every day | ORAL | Status: DC

## 2019-10-26 NOTE — Patient Instructions (Addendum)
Take nexium every day Get the chest xray done at [facility] doctor appointment on Tuesday  F/u as previous

## 2019-10-26 NOTE — Progress Notes (Signed)
   Subjective:    Patient ID: Omar Norris, male    DOB: 1986/02/06, 34 y.o.   MRN: [id number]  Patient presents for L Sided Shoulder Pain (radiates down arm- had issues with reflux)  Patient presents again with left-sided chest pain into his arm with various symptoms associated.Marland Kitchen  He has been seen a few times for this including urgent care.  He was referred to cardiology at our last visit in July they called him multiple times. At his last visit he also had some reflux-like symptoms which she had tried OTC meds.  NSAIDs were making his stomach worse so we discussed continuing NSAIDs that helped his discomfort.  He had chest x-ray done at the urgent care that showed some mild pneumonitis symptoms that was very difficult to tease out his complaints it was recommended that he go get repeat chest x-ray to but he did not have this done.  He was also prescribed Medrol Dosepak muscle relaxer topical Biofreeze because he had pain in his neck and into his arm which is possible musculoskeletal as well.  Today he states he ate some chinese food on Wed night and had severe pain in chest and into neck and down his left arm He took alkaseltzer , improved  But then Thursday he felt pressure in chest aagin, took TUms  He picked up nexium today          Review Of Systems:  GEN- denies fatigue, fever, weight loss,weakness, recent illness HEENT- denies eye drainage, change in vision, nasal discharge, CVS- +chest pain, palpitations RESP- denies SOB, cough, wheeze ABD- denies N/V, change in stools, abd pain GU- denies dysuria, hematuria, dribbling, incontinence MSK- denies joint pain, muscle aches, injury Neuro- denies headache, dizziness, syncope, seizure activity       Objective:    BP 138/68   Pulse 78   Temp 97.8 F (36.6 C) (Temporal)   Resp 14   Ht 5\' 9"  (1.753 m)   Wt 205 lb (93 kg)   SpO2 98%   BMI 30.27 kg/m  GEN- NAD, alert and oriented x3 HEENT- PERRL, EOMI, non injected  sclera, pink conjunctiva, MMM, oropharynx clear Neck- Supple, no thyromegaly CVS- RRR, no murmur RESP-CTAB ABD-NABS,soft,NT,ND MSK- FROM upper ext EXT- No edema Pulses- Radial, DP- 2+  EKG- NSR, poor r waves progression  compared to significant changes       Assessment & Plan:      Problem List Items Addressed This Visit    None    Visit Diagnoses    Atypical chest pain    -  Primary   Atypical with GI symptoms, other times more muscular., he does have family historyof heart disease, so cardiology appt was scheduled during the visit. He had mild pneumonitis changes on previous Xray sho advised to get this today For GI symptoms avoid triggering foods  start nexium daily   Relevant Orders   EKG 12-Lead (Completed)   Abnormal EKG       Indigestion          Note: This dictation was prepared with Dragon dictation along with smaller phrase technology. Any transcriptional errors that result from this process are unintentional.

## 2019-10-28 ENCOUNTER — Emergency Department (HOSPITAL_COMMUNITY)
Admission: EM | Admit: 2019-10-28 | Discharge: 2019-10-28 | Disposition: A | Discharge status: Home / Self Care | Payer: Self-pay | Attending: Emergency Medicine | Admitting: Emergency Medicine

## 2019-10-28 ENCOUNTER — Encounter (HOSPITAL_COMMUNITY): Payer: Self-pay

## 2019-10-28 ENCOUNTER — Other Ambulatory Visit: Payer: Self-pay

## 2019-10-28 ENCOUNTER — Emergency Department (HOSPITAL_COMMUNITY): Payer: Self-pay

## 2019-10-28 ENCOUNTER — Encounter: Payer: Self-pay | Admitting: Family Medicine

## 2019-10-28 DIAGNOSIS — R079 Chest pain, unspecified: Secondary | ICD-10-CM | POA: Insufficient documentation

## 2019-10-28 DIAGNOSIS — Z87891 Personal history of nicotine dependence: Secondary | ICD-10-CM | POA: Insufficient documentation

## 2019-10-28 LAB — BASIC METABOLIC PANEL
Anion gap: 8 (ref 5–15)
BUN: 11 mg/dL (ref 6–20)
CO2: 26 mmol/L (ref 22–32)
Calcium: 9 mg/dL (ref 8.9–10.3)
Chloride: 104 mmol/L (ref 98–111)
Creatinine, Ser: 1 mg/dL (ref 0.61–1.24)
GFR calc Af Amer: >60 mL/min (ref >60)
GFR calc non Af Amer: >60 mL/min (ref >60)
Glucose, Bld: 87 mg/dL (ref 70–99)
Potassium: 4 mmol/L (ref 3.5–5.1)
Sodium: 138 mmol/L (ref 135–145)

## 2019-10-28 LAB — CBC
HCT: 41.5 % (ref 39.0–52.0)
Hemoglobin: 12.6 g/dL — ABNORMAL LOW (ref 13.0–17.0)
MCH: 19.7 pg — ABNORMAL LOW (ref 26.0–34.0)
MCHC: 30.4 g/dL (ref 30.0–36.0)
MCV: 64.9 fL — ABNORMAL LOW (ref 80.0–100.0)
Platelets: 291 10*3/uL (ref 150–400)
RBC: 6.39 MIL/uL — ABNORMAL HIGH (ref 4.22–5.81)
RDW: 17.4 % — ABNORMAL HIGH (ref 11.5–15.5)
WBC: 7.9 10*3/uL (ref 4.0–10.5)
nRBC: 0 % (ref 0.0–0.2)

## 2019-10-28 LAB — TROPONIN I (HIGH SENSITIVITY): Troponin I (High Sensitivity): 3 ng/L (ref <18)

## 2019-10-28 LAB — D-DIMER, QUANTITATIVE: D-Dimer, Quant: <0.27 ug/mL-FEU (ref 0.00–0.50)

## 2019-10-28 NOTE — Discharge Instructions (Addendum)
You were evaluated in the Emergency Department and after careful evaluation, we did not find any emergent condition requiring admission or further testing in the hospital.  Your exam/testing today was overall reassuring.  We suspect your pain is related to sprain strain of the muscles of the chest wall.  We recommend Tylenol or Motrin for discomfort.  Please return to the Emergency Department if you experience any worsening of your condition.  Thank you for allowing [place] to be a part of your care.

## 2019-10-28 NOTE — ED Triage Notes (Signed)
Pt to er, pt states that he is here for some chest/ arm pain off and on for the past month, pt states that it is a shooting pain down his arm, states that he has some tingling in his ring and pinky finger. States that he saw his pmd and they did an ekg and chest x ray and they were ok.  States that he wakes up in the am and takes a Nexium with some relief.

## 2019-10-28 NOTE — ED Provider Notes (Signed)
[facility] Emergency Department Provider Note MRN:  [id number]  Arrival date & time: 10/28/19     Chief Complaint   Arm Pain and Chest Pain   History of Present Illness   Omar Norris is a 34 y.o. year-old male with a history of beta thalassemia presenting to the ED with chief complaint of arm pain and chest pain.  Location: Left chest radiating down left arm Duration: 1 month Onset: Sudden Timing: Constant Description: Sharp Severity: Mild to moderate Exacerbating/Alleviating Factors: None Associated Symptoms: None Pertinent Negatives:  No dizziness, no diaphoresis, no nausea, no vomiting, no shortness of breath, no abdominal pain, no back pain   Review of Systems  A complete 10 system review of systems was obtained and all systems are negative except as noted in the HPI and PMH.   Patient's Health History    Past Medical History:  Diagnosis Date  . Beta thalassemia ([place])     History reviewed. No pertinent surgical history.  Family History  Problem Relation Age of Onset  . Hypertension Mother   . Hyperlipidemia Mother     Social History   Socioeconomic History  . Marital status: Single    Spouse name: Not on file  . Number of children: Not on file  . Years of education: Not on file  . Highest education level: Not on file  Occupational History  . Not on file  Tobacco Use  . Smoking status: Former Smoker    Types: Cigars  . Smokeless tobacco: Never Used  . Tobacco comment: weekends  Vaping Use  . Vaping Use: Former  Substance and Sexual Activity  . Alcohol use: Yes    Alcohol/week: 2.0 standard drinks    Types: 2 Cans of beer per week  . Drug use: No  . Sexual activity: Yes    Birth control/protection: Condom  Other Topics Concern  . Not on file  Social History Narrative  . Not on file   Social Determinants of Health   Financial Resource Strain:   . Difficulty of Paying Living Expenses: Not on file  Food Insecurity:    . Worried About Charity fundraiser in the Last Year: Not on file  . Ran Out of Food in the Last Year: Not on file  Transportation Needs:   . Lack of Transportation (Medical): Not on file  . Lack of Transportation (Non-Medical): Not on file  Physical Activity:   . Days of Exercise per Week: Not on file  . Minutes of Exercise per Session: Not on file  Stress:   . Feeling of Stress : Not on file  Social Connections:   . Frequency of Communication with Friends and Family: Not on file  . Frequency of Social Gatherings with Friends and Family: Not on file  . Attends Religious Services: Not on file  . Active Member of Clubs or Organizations: Not on file  . Attends Archivist Meetings: Not on file  . Marital Status: Not on file  Intimate Partner Violence:   . Fear of Current or Ex-Partner: Not on file  . Emotionally Abused: Not on file  . Physically Abused: Not on file  . Sexually Abused: Not on file     Physical Exam   Vitals:   10/28/19 1825 10/28/19 2024  BP: (!) 144/97 132/71  Pulse: 77 65  Resp: 16   Temp: 98.6 F (37 C)   SpO2: 100% 99%    CONSTITUTIONAL: Well-appearing, NAD NEURO:  Alert  and oriented x 3, no focal deficits EYES:  eyes equal and reactive ENT/NECK:  no LAD, no JVD CARDIO: Regular rate, well-perfused, normal S1 and S2 PULM:  CTAB no wheezing or rhonchi GI/GU:  normal bowel sounds, non-distended, non-tender MSK/SPINE:  No gross deformities, no edema SKIN:  no rash, atraumatic PSYCH:  Appropriate speech and behavior  *Additional and/or pertinent findings included in MDM below  Diagnostic and Interventional Summary    EKG Interpretation  Date/Time:  Sunday October 28 2019 18:27:05 EDT Ventricular Rate:  63 PR Interval:  198 QRS Duration: 98 QT Interval:  356 QTC Calculation: 364 R Axis:   9 Text Interpretation: Normal sinus rhythm no ischemic concerns ECG Confirmed by Gerlene Fee [phone]) on 10/28/2019 9:04:47 PM      Labs  Reviewed  CBC - Abnormal; Notable for the following components:      Result Value   RBC 6.39 (*)    Hemoglobin 12.6 (*)    MCV 64.9 (*)    MCH 19.7 (*)    RDW 17.4 (*)    All other components within normal limits  BASIC METABOLIC PANEL  D-DIMER, QUANTITATIVE (NOT AT [facility])  TROPONIN I (HIGH SENSITIVITY)  TROPONIN I (HIGH SENSITIVITY)    DG Chest 2 View  Final Result      Medications - No data to display   Procedures  /  Critical Care Procedures  ED Course and Medical Decision Making  I have reviewed the triage vital signs, the nursing notes, and pertinent available records from the EMR.  Listed above are laboratory and imaging tests that I personally ordered, reviewed, and interpreted and then considered in my medical decision making (see below for details).  Considering ACS or PE however both these conditions would be rare in a patient this age with minimal risk factors.  More likely to be musculoskeletal in etiology.  Awaiting EKG, troponin, D-dimer.     Work-up is reassuring, negative troponin, negative D-dimer.  EKG is without ischemic findings.  Given the duration of symptoms and patient's well-appearing nature, normal vital signs, no acute distress, normal chest x-ray, doubt emergent process, nothing to suggest dissection.  Some tenderness to palpation on exam, most likely musculoskeletal, appropriate for discharge.  Barth Kirks. Sedonia Small, [place] [email or web address]  Final Clinical Impressions(s) / ED Diagnoses     ICD-10-CM   1. Chest pain, unspecified type  R07.9     ED Discharge Orders    None       Discharge Instructions Discussed with and Provided to Patient:     Discharge Instructions     You were evaluated in the Emergency Department and after careful evaluation, we did not find any emergent condition requiring admission or further testing in the hospital.  Your exam/testing today was overall  reassuring.  We suspect your pain is related to sprain strain of the muscles of the chest wall.  We recommend Tylenol or Motrin for discomfort.  Please return to the Emergency Department if you experience any worsening of your condition.  Thank you for allowing [place] to be a part of your care.        Maudie Flakes, MD 10/28/19 2129

## 2019-10-30 ENCOUNTER — Other Ambulatory Visit: Payer: Self-pay

## 2019-10-30 ENCOUNTER — Ambulatory Visit (INDEPENDENT_AMBULATORY_CARE_PROVIDER_SITE_OTHER): Payer: Self-pay | Admitting: Internal Medicine

## 2019-10-30 ENCOUNTER — Encounter: Payer: Self-pay | Admitting: Internal Medicine

## 2019-10-30 ENCOUNTER — Other Ambulatory Visit (HOSPITAL_COMMUNITY)
Admission: RE | Admit: 2019-10-30 | Discharge: 2019-10-30 | Disposition: A | Discharge status: Home / Self Care | Payer: HRSA Program | Source: Ambulatory Visit | Attending: Internal Medicine | Admitting: Internal Medicine

## 2019-10-30 ENCOUNTER — Other Ambulatory Visit: Payer: Self-pay | Admitting: *Deleted

## 2019-10-30 VITALS — BP 124/60 | HR 70 | Ht 69.0 in | Wt 206.0 lb

## 2019-10-30 DIAGNOSIS — R9431 Abnormal electrocardiogram [ECG] [EKG]: Secondary | ICD-10-CM

## 2019-10-30 DIAGNOSIS — Z20822 Contact with and (suspected) exposure to covid-19: Secondary | ICD-10-CM | POA: Diagnosis not present

## 2019-10-30 DIAGNOSIS — Z01812 Encounter for preprocedural laboratory examination: Secondary | ICD-10-CM | POA: Diagnosis present

## 2019-10-30 DIAGNOSIS — R0789 Other chest pain: Secondary | ICD-10-CM

## 2019-10-30 MED ORDER — IBUPROFEN 600 MG PO TABS
600.0000 mg | ORAL_TABLET | Freq: Three times a day (TID) | ORAL | 0 refills | Status: DC | PRN
Start: 2019-10-30 — End: 2020-03-17

## 2019-10-30 NOTE — Telephone Encounter (Signed)
Per PCP, IBU dose reduced to lower risk of GI problems.

## 2019-10-30 NOTE — Patient Instructions (Addendum)
Medication Instructions:  Your Physician recommend you continue on your current medication as directed.    *If you need a refill on your cardiac medications before your next appointment, please call your pharmacy*   Lab Work: None ordered    Testing/Procedures: Your physician has requested that you have an exercise tolerance test. For further information please visit [email or web address]. Please also follow instruction sheet, as given. [place]. [address]  You will need to have the coronavirus test completed prior to your procedure. This is a Drive Up Visit at [address], [place], [place] [address]. Please tell them that you are there for procedure testing. Stay in your car and someone will be with you shortly. Please make sure to have all other labs completed before this test because you will need to stay quarantined until your procedure.    Follow-Up: At [facility], you and your health needs are our priority.  As part of our continuing mission to provide you with exceptional heart care, we have created designated Provider Care Teams.  These Care Teams include your primary Cardiologist (physician) and Advanced Practice Providers (APPs -  Physician Assistants and Nurse Practitioners) who all work together to provide you with the care you need, when you need it.  We recommend signing up for the patient portal called "MyChart".  Sign up information is provided on this After Visit Summary.  MyChart is used to connect with patients for Virtual Visits (Telemedicine).  Patients are able to view lab/test results, encounter notes, upcoming appointments, etc.  Non-urgent messages can be sent to your provider as well.   To learn more about what you can do with MyChart, go to [email or web address].    Your next appointment:   As needed   The format for your next appointment:   In Person  Provider:   Raliegh Ip Mali Hilty, [facility] at [facility] [address] [place], [place] [address] Phone:  [phone]        You are scheduled for an Exercise Stress Test.   Please arrive 15 minutes prior to your appointment time for registration and insurance purposes.  The test will take approximately 45 minutes to complete.  How to prepare for your Exercise Stress Test: . Do bring a list of your current medications with you.  If not listed below, you may take your medications as normal. . Do wear comfortable clothes (no dresses or overalls) and walking shoes, tennis shoes preferred (no heels or open toed shoes are allowed) . Do Not wear cologne, perfume, aftershave or lotions (deodorant is allowed). . Please report to [place], [address] for your test.  If these instructions are not followed, your test will have to be rescheduled.  If you have questions or concerns about your appointment, you can call the Stress Lab at [phone].  If you cannot keep your appointment, please provide 24 hours notification to the Stress Lab, to avoid a possible $50 charge to your account

## 2019-10-30 NOTE — Telephone Encounter (Signed)
Received call from patient.   Requested refill on IBU.   Ok to refill?

## 2019-10-31 ENCOUNTER — Telehealth (HOSPITAL_COMMUNITY): Payer: Self-pay

## 2019-10-31 ENCOUNTER — Encounter: Payer: Self-pay | Admitting: Internal Medicine

## 2019-10-31 LAB — SARS CORONAVIRUS 2 (TAT 6-24 HRS): SARS Coronavirus 2: NEGATIVE

## 2019-10-31 NOTE — Progress Notes (Signed)
OFFICE CONSULT NOTE  Chief Complaint:  Chest pain  Primary Care Physician: Alycia Rossetti, MD  HPI:  Omar Norris is a 34 y.o. male who is being seen today for the evaluation of chest pain at the request of Buelah Manis, Modena Nunnery, MD.  This is a pleasant 34 year old male who recently was in the emergency department on October 28, 2019 for chest pain.  According to the notes his past medical history is significant only for beta thalassemia.  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  He is described chest pain that goes across his chest and down his arms.  He also gets some pain that is in the back of his neck and out to his shoulders.  This is not necessarily worse with exertion or relieved by rest.  He denies any associated shortness of breath.  No other confounding medical problems such as hypertension or diabetes.  He recently has been taking ibuprofen which was advised from the emergency department.  Testing there was negative with regards to troponins.  Recent lab work in July 2021 showed total cholesterol 135, HDL 77, LDL 50 and triglycerides 29, indicating a very favorable lipid profile and probably a very low risk of heart disease.  He is interested in restarting an exercise program.  PMHx:  Past Medical History:  Diagnosis Date  . Beta thalassemia (H[facility])     No past surgical history on file.  FAMHx:  Family History  Problem Relation Age of Onset  . Hypertension Mother   . Hyperlipidemia Mother     SOCHx:   reports that he has quit smoking. His smoking use included cigars. He has never used smokeless tobacco. He reports current alcohol use of about 2.0 standard drinks of alcohol per week. He reports that he does not use drugs.  ALLERGIES:  No Known Allergies  ROS: Pertinent items  noted in HPI and remainder of comprehensive ROS otherwise negative.  HOME MEDS: Current Outpatient Medications on File Prior to Visit  Medication Sig Dispense Refill  . cyclobenzaprine (FLEXERIL) 10 MG tablet Take 1 tablet (10 mg total) by mouth 3 (three) times daily as needed for muscle spasms. 30 tablet 0  . esomeprazole (NEXIUM) 20 MG capsule Take 1 capsule (20 mg total) by mouth daily at 12 noon.     No current facility-administered medications on file prior to visit.    LABS/IMAGING: No results found for this or any previous visit (from the past 48 hour(s)). No results found.  LIPID PANEL:    Component Value Date/Time   CHOL 135 09/17/2019 0922   TRIG 29 09/17/2019 0922   HDL 77 09/17/2019 0922   CHOLHDL 1.8 09/17/2019 0922   VLDL 8 04/28/2016 0849   LDLCALC 50 09/17/2019 0922    WEIGHTS: Wt Readings from Last 3 Encounters:  10/30/19 206 lb (93.4 kg)  10/28/19 203 lb (92.1 kg)  10/26/19 205 lb (93 kg)    VITALS: BP 124/60   Pulse 70   Ht 5\' 9"  (1.753 m)   Wt 206 lb (93.4 kg)   BMI 30.42 kg/m   EXAM: General appearance: alert, no distress and Muscular Neck: no carotid bruit, no JVD and thyroid not enlarged, symmetric, no tenderness/mass/nodules Lungs: clear to auscultation bilaterally Heart: regular rate and rhythm, S1,  S2 normal, no murmur, click, rub or gallop Abdomen: soft, non-tender; bowel sounds normal; no masses,  no organomegaly Extremities: extremities normal, atraumatic, no cyanosis or edema Pulses: 2+ and symmetric Skin: Skin color, texture, turgor normal. No rashes or lesions Neurologic: Grossly normal Psych: Pleasant  EKG: Normal sinus rhythm with sinus arrhythmia at 70, poor R wave progression anteriorly- personally reviewed  ASSESSMENT: 1. Atypical chest pain 2. Abnormal EKG  PLAN: 1.   Mr. Fluegel is describing an atypical chest pain, I suspect is musculoskeletal or could be some type of a radiculopathy.  It may be related to posture or  how he sitting or perhaps the neck or back problem.  Coronary ischemia is pretty unlikely and his lipid profile is very reassuring.  His EKG does show some mild abnormality with poor R wave progression.  We discussed options for further evaluating his heart and I think we could safely exclude ischemia with a plain treadmill stress test.  This would also serve as a baseline for him to start his exercise program.  He is agreeable to that.  I will contact him with those results and if abnormal will undertake further work-up otherwise he can follow-up with me as needed.  Pixie Casino, MD, [facility] [place] Director of the Advanced Lipid Disorders &  Cardiovascular Risk Reduction Clinic Diplomate of the American Board of Clinical Lipidology Attending Cardiologist  Direct Dial: [phone]  Fax: [phone]  Website:  [email or web address] 10/31/2019, 7:19 PM

## 2019-10-31 NOTE — Telephone Encounter (Signed)
Encounter complete. 

## 2019-11-02 ENCOUNTER — Other Ambulatory Visit: Payer: Self-pay

## 2019-11-02 ENCOUNTER — Ambulatory Visit (HOSPITAL_COMMUNITY)
Admission: RE | Admit: 2019-11-02 | Discharge: 2019-11-02 | Disposition: A | Discharge status: Home / Self Care | Payer: Self-pay | Source: Ambulatory Visit | Attending: Cardiology | Admitting: Cardiology

## 2019-11-02 DIAGNOSIS — R0789 Other chest pain: Secondary | ICD-10-CM | POA: Insufficient documentation

## 2019-11-02 LAB — EXERCISE TOLERANCE TEST
Estimated workload: 14 METS
Exercise duration (min): 12 min
Exercise duration (sec): 10 s
MPHR: 184 {beats}/min
Peak HR: 166 {beats}/min
Percent HR: 90 %
Rest HR: 73 {beats}/min

## 2019-11-09 ENCOUNTER — Ambulatory Visit (INDEPENDENT_AMBULATORY_CARE_PROVIDER_SITE_OTHER): Payer: Self-pay | Admitting: Family Medicine

## 2019-11-09 ENCOUNTER — Other Ambulatory Visit: Payer: Self-pay

## 2019-11-09 ENCOUNTER — Encounter: Payer: Self-pay | Admitting: Family Medicine

## 2019-11-09 VITALS — BP 130/78 | HR 76 | Temp 98.8°F | Resp 14 | Ht 69.0 in | Wt 206.0 lb

## 2019-11-09 DIAGNOSIS — Z113 Encounter for screening for infections with a predominantly sexual mode of transmission: Secondary | ICD-10-CM

## 2019-11-09 NOTE — Patient Instructions (Signed)
We will call with results F/u 3 months for recheck

## 2019-11-09 NOTE — Progress Notes (Signed)
    Subjective:    Patient ID: Omar Norris, male    DOB: 12/01/1985, 34 y.o.   MRN: [id number]  Patient presents for STD Check  Pt here for STD check, no symptoms New partner past 2 months, found out she has another partner as well  Reviewed cardiology notes, no CAD found, passed stress test   Review Of Systems:  GEN- denies fatigue, fever, weight loss,weakness, recent illness HEENT- denies eye drainage, change in vision, nasal discharge, CVS- denies chest pain, palpitations RESP- denies SOB, cough, wheeze ABD- denies N/V, change in stools, abd pain GU- denies dysuria, hematuria, dribbling, incontinence MSK- denies joint pain, muscle aches, injury Neuro- denies headache, dizziness, syncope, seizure activity       Objective:    BP 130/78   Pulse 76   Temp 98.8 F (37.1 C) (Temporal)   Resp 14   Ht 5\' 9"  (1.753 m)   Wt 206 lb (93.4 kg)   SpO2 98%   BMI 30.42 kg/m  GEN- NAD, alert and oriented x3 HEENT- PERRL, EOMI, non injected sclera, pink conjunctiva, MMM, oropharynx clear CVS- RRR, no murmur RESP-CTAB ABD-NABS,soft,NT,ND Pulses- Radial 2+        Assessment & Plan:      Problem List Items Addressed This Visit    None    Visit Diagnoses    Screen for STD (sexually transmitted disease)    -  Primary   Relevant Orders   Trichomonas vaginalis RNA, Ql,Males   C. trachomatis/N. gonorrhoeae RNA   HIV Antibody (routine testing w rflx)   RPR   HSV(herpes simplex vrs) 1+2 ab-IgG   Hepatitis C antibody      Note: This dictation was prepared with Dragon dictation along with smaller phrase technology. Any transcriptional errors that result from this process are unintentional.

## 2019-11-11 ENCOUNTER — Encounter: Payer: Self-pay | Admitting: Family Medicine

## 2019-11-11 LAB — C. TRACHOMATIS/N. GONORRHOEAE RNA
C. trachomatis RNA, TMA: NOT DETECTED
N. gonorrhoeae RNA, TMA: NOT DETECTED

## 2019-11-11 LAB — TRICHOMONAS VAGINALIS RNA, QL,MALES: Trichomonas vaginalis RNA: NOT DETECTED

## 2019-11-12 LAB — HSV(HERPES SIMPLEX VRS) I + II AB-IGG
HAV 1 IGG,TYPE SPECIFIC AB: <0.9 index
HSV 2 IGG,TYPE SPECIFIC AB: <0.9 index

## 2019-11-12 LAB — HIV ANTIBODY (ROUTINE TESTING W REFLEX): HIV 1&2 Ab, 4th Generation: NONREACTIVE

## 2019-11-12 LAB — RPR: RPR Ser Ql: NONREACTIVE

## 2019-11-12 LAB — HEPATITIS C ANTIBODY
Hepatitis C Ab: NONREACTIVE
SIGNAL TO CUT-OFF: 0.01 (ref <1.00)

## 2019-11-23 ENCOUNTER — Encounter: Payer: Self-pay | Admitting: Family Medicine

## 2019-11-23 ENCOUNTER — Other Ambulatory Visit: Payer: Self-pay

## 2019-11-23 ENCOUNTER — Ambulatory Visit (INDEPENDENT_AMBULATORY_CARE_PROVIDER_SITE_OTHER): Payer: Self-pay | Admitting: Family Medicine

## 2019-11-23 VITALS — BP 128/68 | HR 90 | Temp 98.2°F | Resp 14 | Ht 69.0 in | Wt 206.0 lb

## 2019-11-23 DIAGNOSIS — R220 Localized swelling, mass and lump, head: Secondary | ICD-10-CM

## 2019-11-23 MED ORDER — AMOXICILLIN 500 MG PO CAPS: 500.0000 mg | ORAL_CAPSULE | Freq: Two times a day (BID) | ORAL | 0 refills | Status: DC

## 2019-11-23 NOTE — Progress Notes (Signed)
   Subjective:    Patient ID: Omar Norris, male    DOB: 06-27-1985, 34 y.o.   MRN: [id number]  Patient presents for R Sided Mouth Pain (sore lump in back of throat)   HPt called in today with dentla pain, when he brushes his teeth noticed a bump between right side of tongue and gumline that is tender  , he initially had some sore throat but that resolved No bleeding in mouth  He has been doing salt water gargle, using anti-septic mouthwash  He does have a tooth that needs to be removed  No difficulty swallowing or breathing   Review Of Systems:  GEN- denies fatigue, fever, weight loss,weakness, recent illness HEENT- denies eye drainage, change in vision, nasal discharge, CVS- denies chest pain, palpitations RESP- denies SOB, cough, wheeze ABD- denies N/V, change in stools, abd pain Neuro- denies headache, dizziness, syncope, seizure activity       Objective:    BP 128/68   Pulse 90   Temp 98.2 F (36.8 C) (Temporal)   Resp 14   Ht 5\' 9"  (1.753 m)   Wt 206 lb (93.4 kg)   SpO2 98%   BMI 30.42 kg/m  GEN- NAD, alert and oriented x3 HEENT- PERRL, EOMI, non injected sclera, pink conjunctiva, MMM, post oropharynx clear, tongue no lesions ulceration, no tooth abcess, between gum and tongue ? Base of tonsilar pillar small cystic lesions TTP  Neck- Supple, no LAD  CVS- RRR, no murmur RESP-CTAB         Assessment & Plan:      Problem List Items Addressed This Visit    None    Visit Diagnoses    Mouth swelling/cyst    -  Primary   pt to schedule with dentist, he has abeen having tooth pain as well, this looks like a cyst but could be infection, continue salt water rinse, add amoxicillin,       Note: This dictation was prepared with Dragon dictation along with smaller phrase technology. Any transcriptional errors that result from this process are unintentional.

## 2019-11-23 NOTE — Patient Instructions (Signed)
F/U as needed

## 2019-12-19 ENCOUNTER — Encounter: Payer: BLUE CROSS/BLUE SHIELD | Admitting: Family Medicine

## 2019-12-26 ENCOUNTER — Telehealth: Payer: Self-pay | Admitting: *Deleted

## 2019-12-26 NOTE — Telephone Encounter (Signed)
Received call from patient.   Reports that he has been having increased reflux. Advised to monitor diet and decrease spicy foods, or high fat foods as these increase reflux. Advised to continue Nexium, and he can add OTC Pepcid at night as needed.   Also reports that ulcer in back of throat has returned. States that ulcer was thought to be caused by broken tooth in mouth. Reports that reflux is exacerbating pain. States that he did not complete ABTx from last OV, so he started taking them again and pain has lessened. Reports that he is going to schedule visit with dentist. Advised to use salt water gargle.

## 2020-01-07 ENCOUNTER — Ambulatory Visit (INDEPENDENT_AMBULATORY_CARE_PROVIDER_SITE_OTHER): Payer: Self-pay | Admitting: Family Medicine

## 2020-01-07 ENCOUNTER — Other Ambulatory Visit: Payer: Self-pay

## 2020-01-07 ENCOUNTER — Encounter: Payer: Self-pay | Admitting: Family Medicine

## 2020-01-07 VITALS — BP 134/72 | HR 82 | Temp 97.9°F | Resp 14 | Ht 69.0 in | Wt 215.0 lb

## 2020-01-07 DIAGNOSIS — Z113 Encounter for screening for infections with a predominantly sexual mode of transmission: Secondary | ICD-10-CM

## 2020-01-07 DIAGNOSIS — E669 Obesity, unspecified: Secondary | ICD-10-CM

## 2020-01-07 NOTE — Assessment & Plan Note (Signed)
Discussed dietary changes Increasing activity He drives for a living Check A1C Family history of DM

## 2020-01-07 NOTE — Progress Notes (Signed)
   Subjective:    Patient ID: Omar Norris, male    DOB: 1985-05-14, 34 y.o.   MRN: [id number]  Patient presents for Follow-up (repeat STD check)  Pt here for STD screening he is not having concerns today.  He does not have any symptoms.  He is to follow-up on the test to screen for couple months ago.  He has noted that he has gained about 10 pounds in the past 2 months.  He denies eating sweets but has been eating fast food and unhealthy foods.  After looking at the scale states he is motivated to get on track and also start working out at the gym.    Review Of Systems:  GEN- denies fatigue, fever, weight loss,weakness, recent illness HEENT- denies eye drainage, change in vision, nasal discharge, CVS- denies chest pain, palpitations RESP- denies SOB, cough, wheeze ABD- denies N/V, change in stools, abd pain GU- denies dysuria, hematuria, dribbling, incontinence MSK- denies joint pain, muscle aches, injury Neuro- denies headache, dizziness, syncope, seizure activity       Objective:    BP 134/72   Pulse 82   Temp 97.9 F (36.6 C) (Temporal)   Resp 14   Ht 5\' 9"  (1.753 m)   Wt 215 lb (97.5 kg)   SpO2 97%   BMI 31.75 kg/m  GEN- NAD, alert and oriented x3 HEENT- PERRL, EOMI, non injected sclera, pink conjunctiva, MMM, oropharynx clear Neck- Supple, no thyromegaly CVS- RRR, no murmur RESP-CTAB EXT- No edema Pulses- Radial 2+        Assessment & Plan:      Problem List Items Addressed This Visit      Unprioritized   Obesity (BMI 30-39.9)    Discussed dietary changes 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 trachomatis/N. gonorrhoeae RNA   HIV Antibody (routine testing  w rflx)   RPR   HSV(herpes simplex vrs) 1+2 ab-IgG   Hepatitis C antibody      Note: This dictation was prepared with Dragon dictation along with smaller phrase technology. Any transcriptional errors that result from this process are unintentional.

## 2020-01-07 NOTE — Patient Instructions (Signed)
F/U as needed

## 2020-01-08 LAB — COMPREHENSIVE METABOLIC PANEL
AG Ratio: 1.9 (calc) (ref 1.0–2.5)
ALT: 40 U/L (ref 9–46)
AST: 28 U/L (ref 10–40)
Albumin: 4 g/dL (ref 3.6–5.1)
Alkaline phosphatase (APISO): 97 U/L (ref 36–130)
BUN: 16 mg/dL (ref 7–25)
CO2: 23 mmol/L (ref 20–32)
Calcium: 9 mg/dL (ref 8.6–10.3)
Chloride: 102 mmol/L (ref 98–110)
Creat: 1.07 mg/dL (ref 0.60–1.35)
Globulin: 2.1 g/dL (calc) (ref 1.9–3.7)
Glucose, Bld: 86 mg/dL (ref 65–99)
Potassium: 4.2 mmol/L (ref 3.5–5.3)
Sodium: 136 mmol/L (ref 135–146)
Total Bilirubin: 0.7 mg/dL (ref 0.2–1.2)
Total Protein: 6.1 g/dL (ref 6.1–8.1)

## 2020-01-08 LAB — CBC WITH DIFFERENTIAL/PLATELET
Absolute Monocytes: 630 cells/uL (ref 200–950)
Basophils Absolute: 23 cells/uL (ref 0–200)
Basophils Relative: 0.3 %
Eosinophils Absolute: 173 cells/uL (ref 15–500)
Eosinophils Relative: 2.3 %
HCT: 40.3 % (ref 38.5–50.0)
Hemoglobin: 12.1 g/dL — ABNORMAL LOW (ref 13.2–17.1)
Lymphs Abs: 2040 cells/uL (ref 850–3900)
MCH: 19.5 pg — ABNORMAL LOW (ref 27.0–33.0)
MCHC: 30 g/dL — ABNORMAL LOW (ref 32.0–36.0)
MCV: 64.8 fL — ABNORMAL LOW (ref 80.0–100.0)
MPV: 9.7 fL (ref 7.5–12.5)
Monocytes Relative: 8.4 %
Neutro Abs: 4635 cells/uL (ref 1500–7800)
Neutrophils Relative %: 61.8 %
Platelets: 176 10*3/uL (ref 140–400)
RBC: 6.22 10*6/uL — ABNORMAL HIGH (ref 4.20–5.80)
RDW: 17.6 % — ABNORMAL HIGH (ref 11.0–15.0)
Total Lymphocyte: 27.2 %
WBC: 7.5 10*3/uL (ref 3.8–10.8)

## 2020-01-08 LAB — TRICHOMONAS VAGINALIS RNA, QL,MALES: Trichomonas vaginalis RNA: NOT DETECTED

## 2020-01-08 LAB — HEMOGLOBIN A1C
Hgb A1c MFr Bld: 5.2 % of total Hgb (ref <5.7)
Mean Plasma Glucose: 103 (calc)
eAG (mmol/L): 5.7 (calc)

## 2020-01-08 LAB — HSV(HERPES SIMPLEX VRS) I + II AB-IGG
HAV 1 IGG,TYPE SPECIFIC AB: <0.9 index
HSV 2 IGG,TYPE SPECIFIC AB: <0.9 index

## 2020-01-08 LAB — C. TRACHOMATIS/N. GONORRHOEAE RNA
C. trachomatis RNA, TMA: NOT DETECTED
N. gonorrhoeae RNA, TMA: NOT DETECTED

## 2020-01-08 LAB — HEPATITIS C ANTIBODY
Hepatitis C Ab: NONREACTIVE
SIGNAL TO CUT-OFF: 0.01 (ref <1.00)

## 2020-01-08 LAB — RPR: RPR Ser Ql: NONREACTIVE

## 2020-01-08 LAB — HIV ANTIBODY (ROUTINE TESTING W REFLEX): HIV 1&2 Ab, 4th Generation: NONREACTIVE

## 2020-01-10 ENCOUNTER — Telehealth: Payer: Self-pay | Admitting: *Deleted

## 2020-01-10 NOTE — Telephone Encounter (Signed)
Received VM from patient.   Reports that he is having intermittent SOB, abdominal pain and lightheadedness.   Call placed to patient to inquire. [place].

## 2020-01-11 ENCOUNTER — Encounter: Payer: Self-pay | Admitting: Family Medicine

## 2020-01-11 NOTE — Telephone Encounter (Signed)
Call placed to patient to inquire.   Reports that he had severe reflux and abd pain on 01/10/2020.  Reports that he did take some pepto bismol and is now much improved.   Advised to contact office if sx worsen or fail to resolve.

## 2020-01-14 ENCOUNTER — Telehealth: Payer: Self-pay | Admitting: *Deleted

## 2020-01-14 DIAGNOSIS — K219 Gastro-esophageal reflux disease without esophagitis: Secondary | ICD-10-CM

## 2020-01-14 DIAGNOSIS — R1013 Epigastric pain: Secondary | ICD-10-CM

## 2020-01-14 NOTE — Telephone Encounter (Signed)
Received call from patient.   Reports that he continues to have increased reflux and abd pain. Reports that he has also noted dark stools and is concerned about GI bleed.   Referral placed for GI.

## 2020-01-15 ENCOUNTER — Encounter (INDEPENDENT_AMBULATORY_CARE_PROVIDER_SITE_OTHER): Payer: Self-pay | Admitting: *Deleted

## 2020-01-24 IMAGING — US US ABDOMEN COMPLETE
1 series · 14 of 25 positions shown · non-contrast
Comparison: None.

CLINICAL DATA: Five day history of epigastric region pain

EXAM:
ABDOMEN ULTRASOUND COMPLETE

[Series 1: us abdomen complete · 0.20mm/px · 14 of 60 slices shown]
[im 1/60]
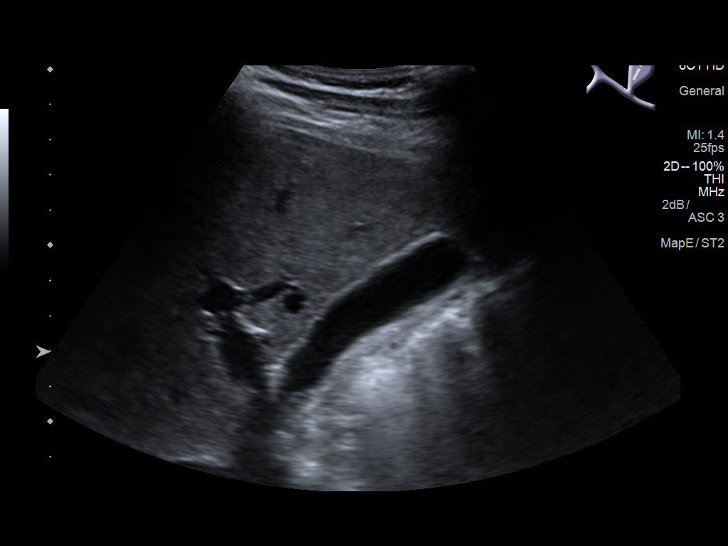
[im 5/60]
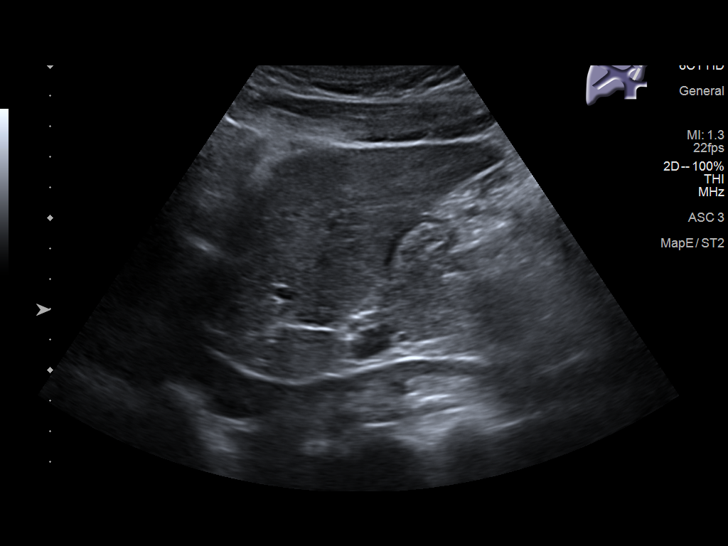
[im 10/60]
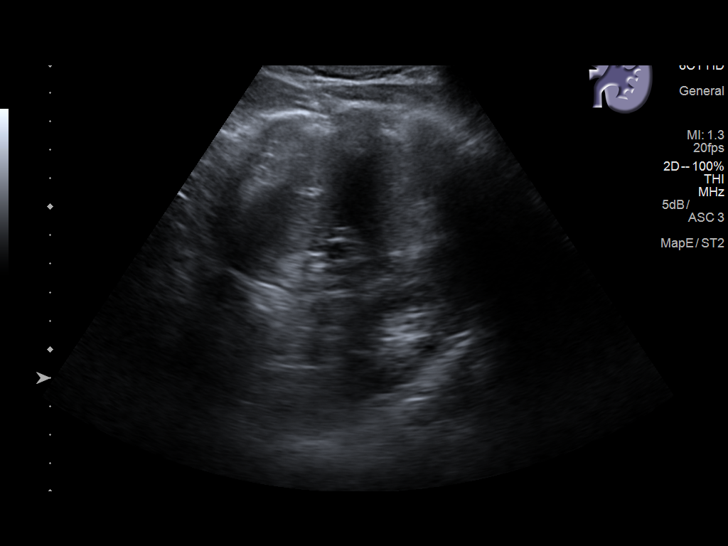
[im 15/60]
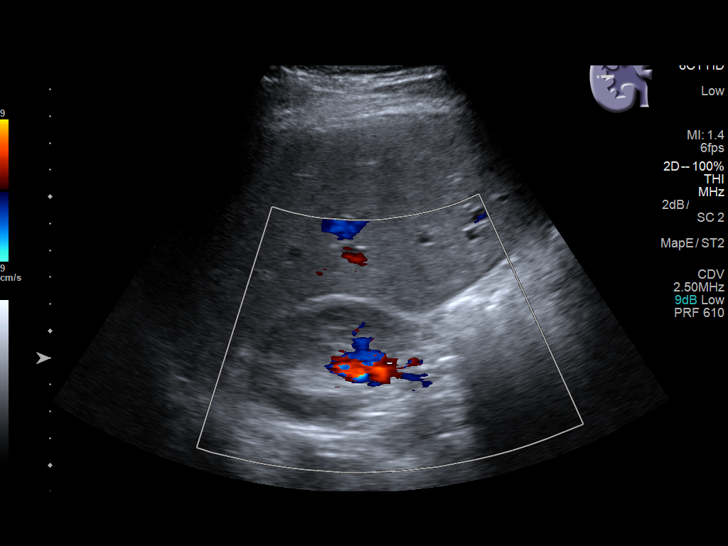
[im 20/60]
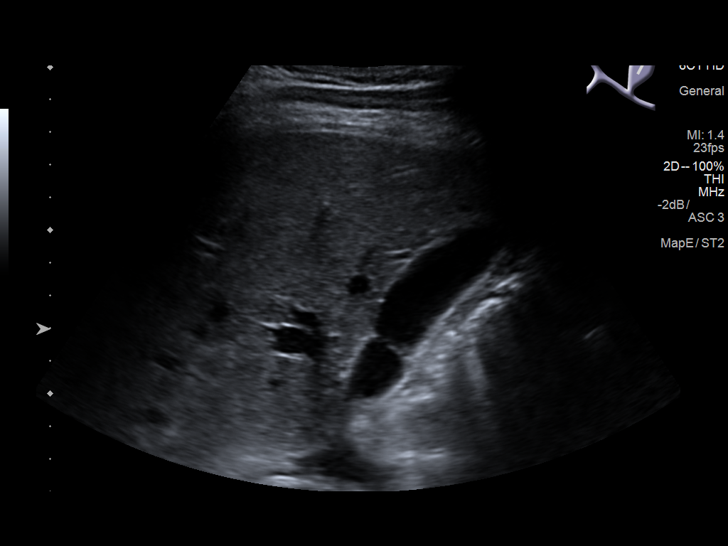
[im 23/60]
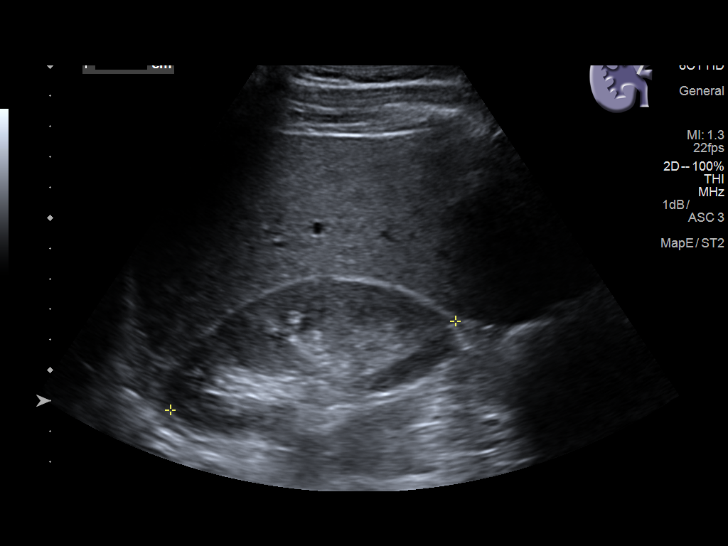
[im 28/60]
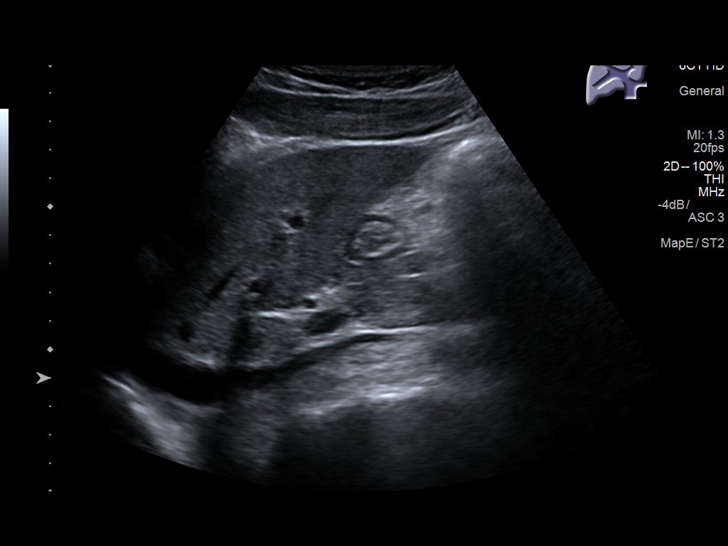
[im 32/60]
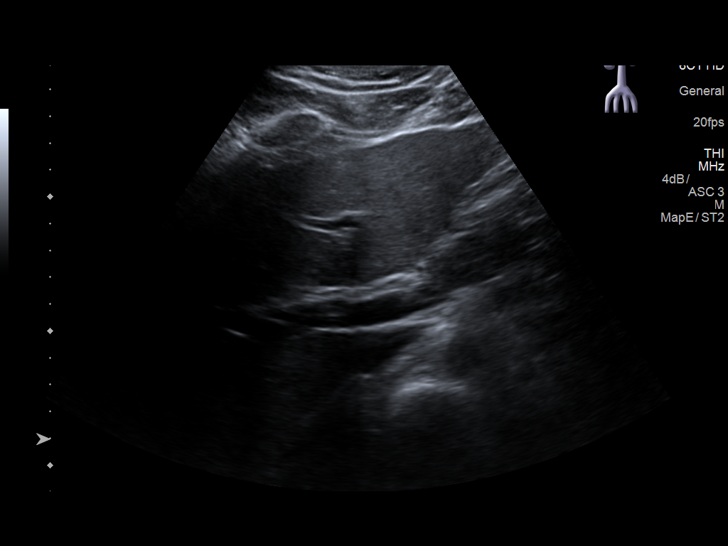
[im 37/60]
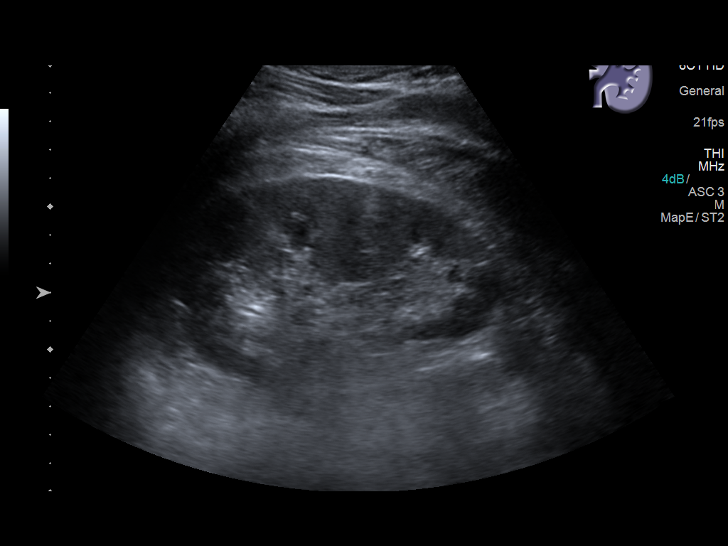
[im 40/60]
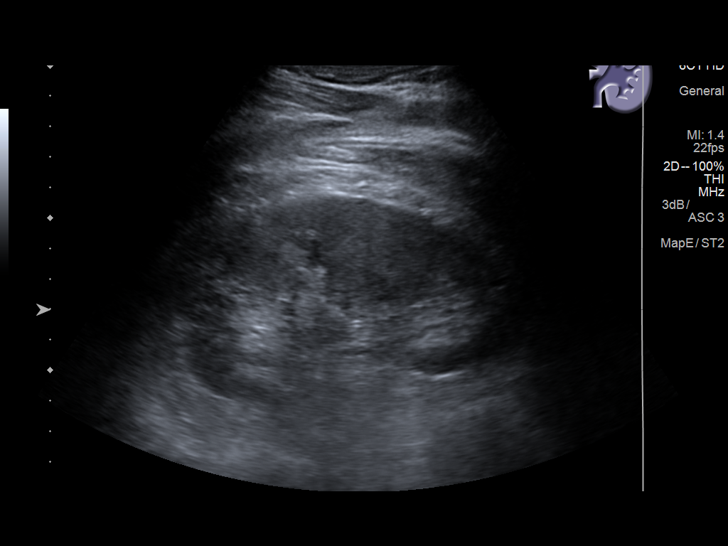
[im 45/60]
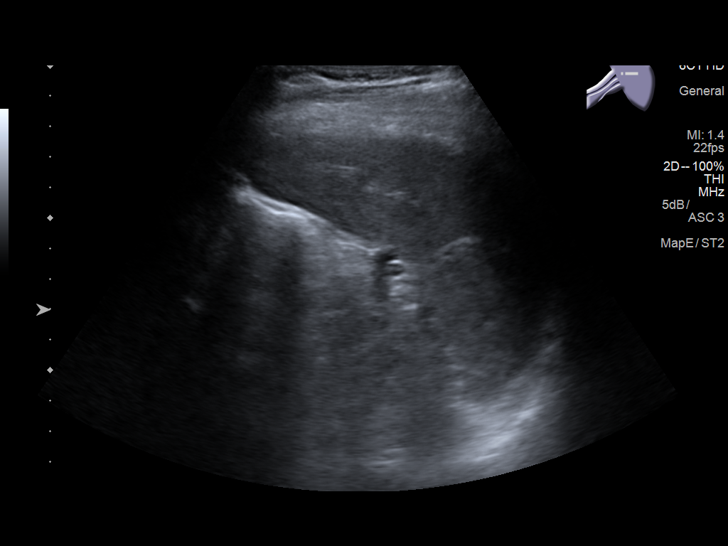
[im 50/60]
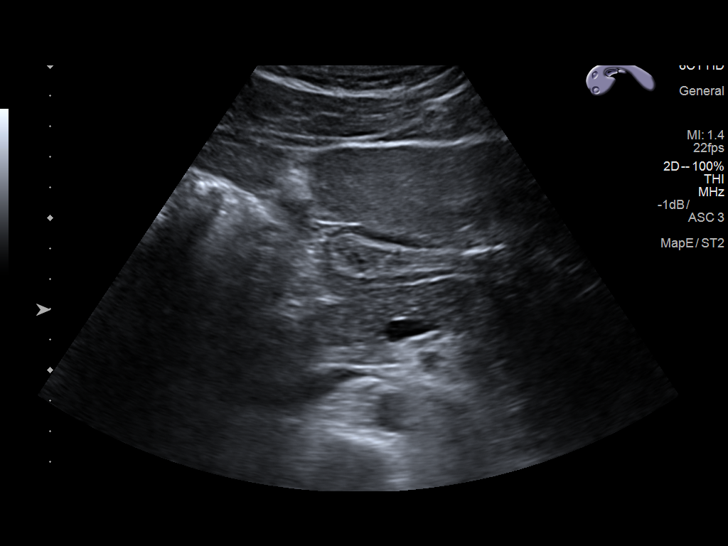
[im 55/60]
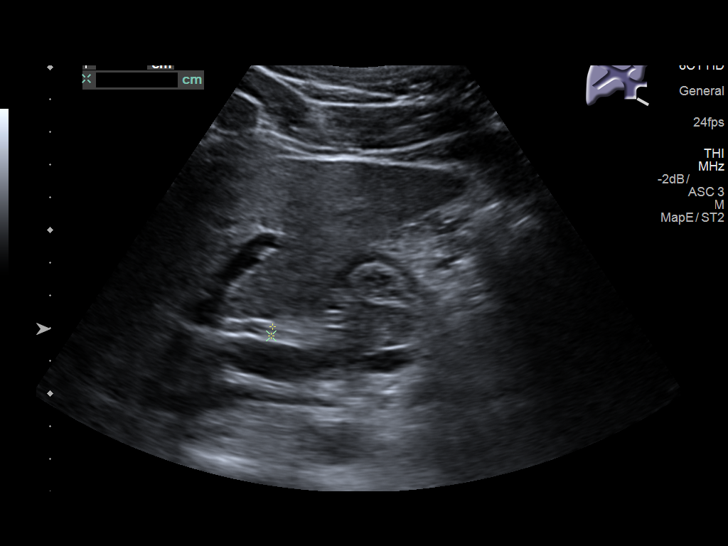
[im 60/60]
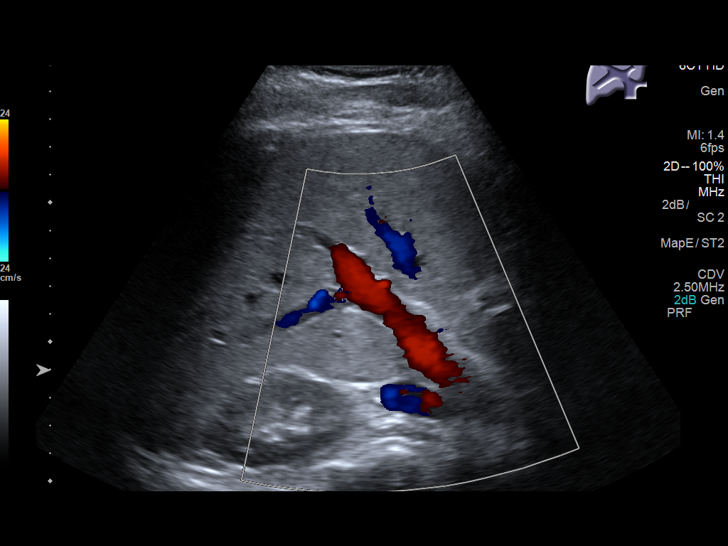

[14 of 25 positions shown; findings below may reference images not displayed]

FINDINGS: Gallbladder: No gallstones or wall thickening visualized. There is
no pericholecystic fluid. No sonographic Murphy sign noted by
sonographer.

Common bile duct: Diameter: 3 mm. No intrahepatic, common hepatic,
or common bile duct dilatation.

Liver: No focal lesion identified. Within normal limits in
parenchymal echogenicity. Portal vein is patent on color Doppler
imaging with normal direction of blood flow towards the liver.

IVC: No abnormality visualized.

Pancreas: No pancreatic mass or inflammatory focus.

Spleen: Size and appearance within normal limits.

Right Kidney: Length: 11.7 cm. Echogenicity within normal limits. No
mass or hydronephrosis visualized.

Left Kidney: Length: 9.8 cm. Echogenicity within normal limits. No
mass or hydronephrosis visualized.

Abdominal aorta: No aneurysm visualized.

Other findings: No appreciable ascites.
IMPRESSION: Study within normal limits.

## 2020-02-07 ENCOUNTER — Encounter (INDEPENDENT_AMBULATORY_CARE_PROVIDER_SITE_OTHER): Payer: Self-pay | Admitting: *Deleted

## 2020-02-25 ENCOUNTER — Other Ambulatory Visit: Payer: Self-pay

## 2020-02-25 ENCOUNTER — Ambulatory Visit (INDEPENDENT_AMBULATORY_CARE_PROVIDER_SITE_OTHER): Payer: Managed Care, Other (non HMO) | Admitting: Nurse Practitioner

## 2020-02-25 VITALS — HR 72 | Temp 97.3°F

## 2020-02-25 DIAGNOSIS — J029 Acute pharyngitis, unspecified: Secondary | ICD-10-CM

## 2020-02-25 NOTE — Patient Instructions (Signed)
Stomatitis Stomatitis is a condition that causes swelling (inflammation) in your mouth. It can affect all or part of the inside of the mouth. The condition often affects your cheek, teeth, gums, lips, and tongue. It can also affect the tissues that produce mucus in your mouth (mucosa). Pain from stomatitis can make it hard for you to eat or drink. Very bad cases of this condition can lead to:  Not getting enough fluid in your body (dehydration).  Poor nutrition. Follow these instructions at home: Medicines  Take over-the-counter and prescription medicines only as told by your doctor.  If you were given an antibiotic medicine, take it as told by your doctor. Do not stop taking the medicine even if you start to feel better.  Do not use products that contain benzocaine in children who are younger than 2 years.  Do not drive or use heavy machinery while taking prescription pain medicine. Eating and drinking  Eat a balanced diet.  Do not eat: ? Spicy foods. ? Citrus, such as oranges. ? Foods that have sharp edges, such as chips.  Do not eat any foods that you think may be causing this condition.  Do not drink alcohol.  Drink enough fluid to keep your pee (urine) pale yellow. This will keep you hydrated. Lifestyle      Take good care of your mouth and teeth (oral hygiene): ? Gently brush your teeth with a soft toothbrush. Do this 2 times each day. ? Floss your teeth every day. ? Have your teeth cleaned regularly. Do this as told by your dentist.  If you have dentures, make sure that they fit the way that they should.  Do not use any products that contain nicotine or tobacco, such as cigarettes and e-cigarettes. If you need help quitting, ask your doctor.  Find ways to lower your stress. Try yoga or meditation. Ask your doctor for other ideas. General instructions  Use a salt-water rinse for pain as told by your doctor.  Keep all follow-up visits as told by your doctor.  This is important. Contact a doctor if:  Your symptoms get worse.  You have new symptoms, like: ? A rash. ? New symptoms that do not involve your mouth area.  Your symptoms last longer than 3 weeks.  Your symptoms go away and then come back.  You are more tired.  You feel weaker.  You stop feeling hungry.  You feel sick to your stomach (nauseous). Get help right away if you:  Have a fever.  Are not able to eat or drink.  Have a lot of bleeding in your mouth. Summary  Stomatitis is a condition that causes swelling in your mouth.  Pain from stomatitis can make it hard for you to eat or drink. Very bad cases of this condition can lead to not getting enough fluid in your body or poor nutrition.  Take medicines only as told by your doctor.  Follow instructions from your doctor on diet and lifestyle changes to help manage your symptoms. This information is not intended to replace advice given to you by your health care provider. Make sure you discuss any questions you have with your health care provider. Document Revised: 03/08/2017 Document Reviewed: 03/08/2017 Elsevier Patient Education  2020 [employer] Inc[employer]  

## 2020-02-25 NOTE — Progress Notes (Signed)
Subjective:    Patient ID: Omar Norris, male    DOB: 27-Jun-1985, 35 y.o.   MRN: [id number]  HPI: Omar Norris is a 35 y.o. male presenting via parking lot visit due to COVID-19 pandemic for sore throat.   Chief Complaint  Patient presents with  . Sore Throat    Reports sore throat and tongue sores started yesterday.  Had these about 1 month ago and started using mouthwash and they went away.  Ran out of mouth wash a few days ago.   UPPER RESPIRATORY TRACT INFECTION Onset: yesterday Worst symptom: sore throat Fever: no Cough: no Shortness of breath: no Wheezing: no Chest pain: no Chest tightness: no Chest congestion: no Nasal congestion: no Runny nose: no Post nasal drip: no Sneezing: no Sore throat: yes Swollen glands: no Sinus pressure: no Headache: no Face pain: no Toothache: no Ear pain: no  Ear pressure: no  Eyes red/itching:no Eye drainage/crusting: no  Nausea: no Vomiting: no Diarrhea: no Change in appetite: no Loss of taste/smell: no Rash: no Fatigue: no Sick contacts: no Strep contacts: no  Context: stable Recurrent sinusitis: no Treatments attempted: nothing Relief with OTC medications: not tried  No Known Allergies  Outpatient Encounter Medications as of 02/25/2020  Medication Sig  . esomeprazole (NEXIUM) 20 MG capsule Take 1 capsule (20 mg total) by mouth daily at 12 noon.  Marland Kitchen ibuprofen (ADVIL) 600 MG tablet Take 1 tablet (600 mg total) by mouth every 8 (eight) hours as needed. Take with food   No facility-administered encounter medications on file as of 02/25/2020.    Patient Active Problem List   Diagnosis Date Noted  . Obesity (BMI 30-39.9) 01/07/2020  . Tobacco use 09/07/2019  . Lipoma of forehead 09/19/2015  . Sore throat 03/18/2012  . Seasonal allergies 11/09/2011    Past Medical History:  Diagnosis Date  . Beta thalassemia (HCC)     Relevant past medical, surgical, family and social history reviewed and updated as  indicated. Interim medical history since our last visit reviewed.  Review of Systems  Constitutional: Negative.  Negative for activity change, appetite change, chills, fatigue and fever.  HENT: Positive for mouth sores and sore throat. Negative for ear discharge, ear pain, postnasal drip, rhinorrhea, sinus pressure and sneezing.   Eyes: Negative.  Negative for pain, discharge, redness and itching.  Respiratory: Negative.  Negative for cough, shortness of breath and wheezing.   Cardiovascular: Negative.  Negative for chest pain.  Gastrointestinal: Negative.  Negative for diarrhea, nausea and vomiting.  Genitourinary: Negative.   Musculoskeletal: Negative.   Skin: Negative.  Negative for rash.  Neurological: Negative.  Negative for headaches.  Hematological: Negative.  Negative for adenopathy.  Psychiatric/Behavioral: Negative.     Per HPI unless specifically indicated above     Objective:    Pulse 72   Temp (!) 97.3 F (36.3 C)   SpO2 93%   Wt Readings from Last 3 Encounters:  01/07/20 215 lb (97.5 kg)  11/23/19 206 lb (93.4 kg)  11/09/19 206 lb (93.4 kg)    Physical Exam Vitals and nursing note reviewed.  Constitutional:      General: He is not in acute distress.    Appearance: He is well-developed. He is not toxic-appearing.  HENT:     Head: Normocephalic and atraumatic.     Right Ear: External ear normal.     Left Ear: External ear normal.     Nose: Nose normal. No congestion.  Mouth/Throat:     Mouth: Mucous membranes are moist. Oral lesions present. No angioedema.     Dentition: Normal dentition.     Tongue: Lesions present. Tongue does not deviate from midline.     Palate: No mass and lesions.     Pharynx: Oropharynx is clear. Uvula midline. No pharyngeal swelling or posterior oropharyngeal erythema.     Tonsils: No tonsillar exudate or tonsillar abscesses.     Comments: Lesions noted to bilateral sides of tongue consistent with aphthous stomatits Eyes:      General: No scleral icterus.    Extraocular Movements: Extraocular movements intact.  Cardiovascular:     Rate and Rhythm: Normal rate.  Pulmonary:     Effort: Pulmonary effort is normal. No respiratory distress.  Skin:    General: Skin is warm and dry.     Coloration: Skin is not jaundiced or pale.     Findings: No erythema.  Neurological:     Mental Status: He is alert and oriented to person, place, and time.     Motor: No weakness.     Gait: Gait normal.  Psychiatric:        Mood and Affect: Mood normal.        Behavior: Behavior normal.        Thought Content: Thought content normal.        Judgment: Judgment normal.     Results for orders placed or performed in visit on 02/25/20  STREP GROUP A AG, W/REFLEX TO CULT   Specimen: Throat  Result Value Ref Range   Streptococcus Group A AG NOT DETECTED NOT DETECT      Assessment & Plan:   Problem List Items Addressed This Visit      Other   Sore throat - Primary    Acute, ongoing x 1 day.  Rapid strep negative today in clinic.  Will follow culture and COVID test.  Sore throat/tongue pain likely due to recurring aphthous stomatitis.  Encouraged patient to resume oral hygiene with mouth rinses that previously worked for same problem.  If no better in a couple of days, return to clinic and/or reach out to dentist.        Relevant Orders   SARS-COV-2 RNA,(COVID-19) QUAL NAAT   STREP GROUP A AG, W/REFLEX TO CULT (Completed)       Follow up plan: Return if symptoms worsen or fail to improve.

## 2020-02-25 NOTE — Assessment & Plan Note (Signed)
Acute, ongoing x 1 day.  Rapid strep negative today in clinic.  Will follow culture and COVID test.  Sore throat/tongue pain likely due to recurring aphthous stomatitis.  Encouraged patient to resume oral hygiene with mouth rinses that previously worked for same problem.  If no better in a couple of days, return to clinic and/or reach out to dentist.

## 2020-02-27 LAB — CULTURE, GROUP A STREP
MICRO NUMBER:: 11375863
SPECIMEN QUALITY:: ADEQUATE

## 2020-02-27 LAB — SARS-COV-2 RNA,(COVID-19) QUALITATIVE NAAT: SARS CoV2 RNA: NOT DETECTED

## 2020-02-27 LAB — STREP GROUP A AG, W/REFLEX TO CULT: Streptococcus Group A AG: NOT DETECTED

## 2020-03-04 ENCOUNTER — Ambulatory Visit: Payer: Managed Care, Other (non HMO) | Admitting: Family Medicine

## 2020-03-10 ENCOUNTER — Ambulatory Visit: Payer: Self-pay | Admitting: Family Medicine

## 2020-03-17 ENCOUNTER — Encounter: Payer: Self-pay | Admitting: Family Medicine

## 2020-03-17 ENCOUNTER — Other Ambulatory Visit: Payer: Self-pay

## 2020-03-17 ENCOUNTER — Ambulatory Visit (INDEPENDENT_AMBULATORY_CARE_PROVIDER_SITE_OTHER): Payer: Managed Care, Other (non HMO) | Admitting: Family Medicine

## 2020-03-17 VITALS — BP 126/82 | HR 88 | Temp 99.3°F | Resp 14 | Ht 69.0 in | Wt 208.0 lb

## 2020-03-17 DIAGNOSIS — Z113 Encounter for screening for infections with a predominantly sexual mode of transmission: Secondary | ICD-10-CM

## 2020-03-17 DIAGNOSIS — S39012A Strain of muscle, fascia and tendon of lower back, initial encounter: Secondary | ICD-10-CM

## 2020-03-17 DIAGNOSIS — S76219A Strain of adductor muscle, fascia and tendon of unspecified thigh, initial encounter: Secondary | ICD-10-CM

## 2020-03-17 DIAGNOSIS — S39012D Strain of muscle, fascia and tendon of lower back, subsequent encounter: Secondary | ICD-10-CM

## 2020-03-17 MED ORDER — IBUPROFEN 600 MG PO TABS
600.0000 mg | ORAL_TABLET | Freq: Three times a day (TID) | ORAL | 0 refills | Status: DC | PRN
Start: 2020-03-17 — End: 2021-06-10

## 2020-03-17 NOTE — Progress Notes (Signed)
   Subjective:    Patient ID: Omar Norris, male    DOB: 25-Feb-1985, 35 y.o.   MRN: [id number]  Patient presents for Lower Abdominal Pain (X weeks- pain in lower abd/ groin- thinks it may be a hernia)   Pt here here with lower back pain and pain into groin 2 weeks. Unable to be seen initially as our office had COVID-19 outbreak, then there was this morning.  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  He remembers the hose, jerking back and then he had some pain and discomfort in his back and groin afterwards.  Denies any radiating symptoms down his legs his back pain has improved but he still gets it in his groin.  When he is working or when he goes to fall or twist or turn a certain way he will feel it in the groin.  He has not noted any swelling or bulge in the groin or the scrotum.   He would also like STD screening today. No change in bowel or bladder No tingling or numbness in LE   he has not seen an workers comp doctor    STD check     Review Of Systems:  GEN- denies fatigue, fever, weight loss,weakness, recent illness HEENT- denies eye drainage, change in vision, nasal discharge, CVS- denies chest pain, palpitations RESP- denies SOB, cough, wheeze ABD- denies N/V, change in stools, abd pain GU- denies dysuria, hematuria, dribbling, incontinence MSK- + joint pain,+ muscle aches, injury Neuro- denies headache, dizziness, syncope, seizure activity       Objective:    BP 126/82   Pulse 88   Temp 99.3 F (37.4 C) (Temporal)   Resp 14   Ht 5\' 9"  (1.753 m)   Wt 208 lb (94.3 kg)   SpO2 96%   BMI 30.72 kg/m  GEN- NAD, alert and oriented x3 CVS- RRR, no murmur RESP-CTAB ABD-NABS,soft,NT,ND MSK Spine NT, FROM, neg SLR, FROM HIPPS/KNEES, no spasm GU - groin NT, no bulge or hernia seen, scrotum no mass/ no tenderness, no penile lesions  NEURO- normal tone LE, sensation in tact LE, strength in tact, DTR symmetric  EXT- No  edema Pulses- Radial  2+        Assessment & Plan:      Problem List Items Addressed This Visit   None   Visit Diagnoses    Screen for STD (sexually transmitted disease)    -  Primary   Done at pt request   Relevant Orders   Trichomonas vaginalis RNA, Ql,Males   C. trachomatis/N. gonorrhoeae RNA   HIV Antibody (routine testing w rflx)   RPR   HSV(herpes simplex vrs) 1+2 ab-IgG   Back strain, subsequent encounter       MSK strain in back and groin, no red flags, no imaging needed, no sign of hernia. given prn NSAID, take with food,s hort term due to GERD history Should improve in 4-6 weeks    Groin strain, unspecified laterality, initial encounter          Note: This dictation was prepared with Dragon dictation along with smaller phrase technology. Any transcriptional errors that result from this process are unintentional.

## 2020-03-17 NOTE — Patient Instructions (Signed)
Use biofreeze or topical rub  Ibuprofen with food  F/U as needed

## 2020-03-18 LAB — C. TRACHOMATIS/N. GONORRHOEAE RNA
C. trachomatis RNA, TMA: NOT DETECTED
N. gonorrhoeae RNA, TMA: NOT DETECTED

## 2020-03-18 LAB — TRICHOMONAS VAGINALIS RNA, QL,MALES: Trichomonas vaginalis RNA: NOT DETECTED

## 2020-03-18 LAB — HSV(HERPES SIMPLEX VRS) I + II AB-IGG
HAV 1 IGG,TYPE SPECIFIC AB: <0.9 index
HSV 2 IGG,TYPE SPECIFIC AB: <0.9 index

## 2020-03-18 LAB — RPR: RPR Ser Ql: NONREACTIVE

## 2020-03-18 LAB — HIV ANTIBODY (ROUTINE TESTING W REFLEX): HIV 1&2 Ab, 4th Generation: NONREACTIVE

## 2020-04-10 ENCOUNTER — Ambulatory Visit (INDEPENDENT_AMBULATORY_CARE_PROVIDER_SITE_OTHER): Payer: Self-pay | Admitting: Gastroenterology

## 2020-04-28 ENCOUNTER — Ambulatory Visit: Payer: Managed Care, Other (non HMO) | Admitting: Nurse Practitioner

## 2020-04-28 ENCOUNTER — Emergency Department (HOSPITAL_COMMUNITY)
Admission: EM | Admit: 2020-04-28 | Discharge: 2020-04-28 | Disposition: A | Discharge status: Home / Self Care | Payer: Managed Care, Other (non HMO) | Attending: Emergency Medicine | Admitting: Emergency Medicine

## 2020-04-28 ENCOUNTER — Other Ambulatory Visit: Payer: Self-pay

## 2020-04-28 VITALS — BP 130/82 | HR 79 | Temp 98.5°F | Ht 69.0 in | Wt 204.6 lb

## 2020-04-28 DIAGNOSIS — Z113 Encounter for screening for infections with a predominantly sexual mode of transmission: Secondary | ICD-10-CM | POA: Diagnosis not present

## 2020-04-28 DIAGNOSIS — N50812 Left testicular pain: Secondary | ICD-10-CM | POA: Diagnosis present

## 2020-04-28 DIAGNOSIS — Z87891 Personal history of nicotine dependence: Secondary | ICD-10-CM | POA: Insufficient documentation

## 2020-04-28 DIAGNOSIS — R1032 Left lower quadrant pain: Secondary | ICD-10-CM

## 2020-04-28 LAB — URINALYSIS, ROUTINE W REFLEX MICROSCOPIC
Bilirubin Urine: NEGATIVE
Glucose, UA: NEGATIVE
Hgb urine dipstick: NEGATIVE
Ketones, ur: NEGATIVE
Leukocytes,Ua: NEGATIVE
Nitrite: NEGATIVE
Protein, ur: NEGATIVE
Specific Gravity, Urine: 1.02 (ref 1.001–1.03)
pH: 7.5 (ref 5.0–8.0)

## 2020-04-28 NOTE — Discharge Instructions (Addendum)
Return tomorrow at the given time for an ultrasound to further evaluate your pain.  Follow-up with your primary doctor in the next week, and return to the ER if symptoms significantly worsen or change.

## 2020-04-28 NOTE — ED Provider Notes (Signed)
[facility] EMERGENCY DEPARTMENT Provider Note   CSN: [id number] Arrival date & time: 04/28/20  1191     History No chief complaint on file.   Omar Norris is a 35 y.o. male.  Patient is a 35 year old male with no significant past medical history.  He presents today with complaints of left groin/testicle pain.  This has been ongoing for the past month.  This occurs intermittently and seems to flareup when he performs heavy lifting at work.  He denies any difficulty urinating.  He denies any bulge or mass.  The history is provided by the patient.       Past Medical History:  Diagnosis Date  . Beta thalassemia [facility])     Patient Active Problem List   Diagnosis Date Noted  . Obesity (BMI 30-39.9) 01/07/2020  . Tobacco use 09/07/2019  . Lipoma of forehead 09/19/2015  . Sore throat 03/18/2012  . Seasonal allergies 11/09/2011    No past surgical history on file.     Family History  Problem Relation Age of Onset  . Hypertension Mother   . Hyperlipidemia Mother     Social History   Tobacco Use  . Smoking status: Former Smoker    Types: Cigars  . Smokeless tobacco: Never Used  . Tobacco comment: weekends  Vaping Use  . Vaping Use: Former  Substance Use Topics  . Alcohol use: Yes    Alcohol/week: 2.0 standard drinks    Types: 2 Cans of beer per week  . Drug use: No    Home Medications Prior to Admission medications   Medication Sig Start Date End Date Taking? Authorizing Provider  esomeprazole (NEXIUM) 20 MG capsule Take 1 capsule (20 mg total) by mouth daily at 12 noon. 10/26/19   Alycia Rossetti, MD  ibuprofen (ADVIL) 600 MG tablet Take 1 tablet (600 mg total) by mouth every 8 (eight) hours as needed. Take with food 03/17/20   Alycia Rossetti, MD    Allergies    Patient has no known allergies.  Review of Systems   Review of Systems  All other systems reviewed and are negative.   Physical Exam Updated Vital Signs BP (!) 158/101   Pulse 81   Temp  98.6 F (37 C)   Resp 17   Ht 5\' 9"  (1.753 m)   Wt 94.3 kg   SpO2 97%   BMI 30.70 kg/m   Physical Exam Constitutional:      General: He is not in acute distress.    Appearance: Normal appearance. He is not ill-appearing, toxic-appearing or diaphoretic.  HENT:     Head: Normocephalic and atraumatic.  Pulmonary:     Effort: Pulmonary effort is normal.  Abdominal:     General: There is no distension.     Tenderness: There is no abdominal tenderness.  Genitourinary:    Comments: There is mild tenderness to the left testicle, however it is freely mobile within the scrotum.  There are no scrotal abnormalities noted.  I am unable to palpate a hernia in the inguinal region or scrotum. Musculoskeletal:        General: Normal range of motion.  Skin:    General: Skin is warm and dry.  Neurological:     Mental Status: He is alert and oriented to person, place, and time.     ED Results / Procedures / Treatments   Labs (all labs ordered are listed, but only abnormal results are displayed) Labs Reviewed - No data to  display  EKG None  Radiology No results found.  Procedures Procedures   Medications Ordered in ED Medications - No data to display  ED Course  I have reviewed the triage vital signs and the nursing notes.  Pertinent labs & imaging results that were available during my care of the patient were reviewed by me and considered in my medical decision making (see chart for details).    MDM Rules/Calculators/A&P  Patient presenting with complaints of pain in the left testicle that seems to worsen with heavy lifting.  His exam shows no obvious abnormality.  He reports mild tenderness to the testicle itself, however it is freely mobile within the scrotum and I highly doubt torsion.  Patient symptoms have been ongoing intermittently for the past month.  I feel as though an ultrasound in the a.m. is appropriate.  This will be arranged.  He is to return in the meantime if  symptoms worsen.  Final Clinical Impression(s) / ED Diagnoses Final diagnoses:  None    Rx / DC Orders ED Discharge Orders    None       Veryl Speak, MD 04/28/20 [phone]

## 2020-04-28 NOTE — ED Triage Notes (Signed)
Pt here from home cc of  Left groin pain/inner thigh. Thinks he has a hernia. Does manual labor at work. Has been hurting on and off for  4 weeks . Worse tonight.

## 2020-04-28 NOTE — Patient Instructions (Signed)
F/u pending results

## 2020-04-28 NOTE — Progress Notes (Signed)
Subjective:    Patient ID: Omar Norris, male    DOB: [birth year], 35 y.o.   MRN: [id number]  HPI: Omar Norris is a 35 y.o. male presenting for left-sided groin pain.  Chief Complaint  Patient presents with  . Testicle Pain   TESTICLE PAIN Duraiton: 2 months Location: left side Duration: intermittent Pain: yes Character: being kicked in the scrotum Severe: 7-8/10 Alleviating factors: hot shower Aggravating factors: pulling/pushing, bowling Treatments attempted: time, hot shower Bulge: no Fevers: no Urinary symptoms: no  STD SCREENING Sexual activity:  Recent unprotected sexual encounter Contraception: no Recent unprotected intercourse: yes History of sexually transmitted diseases: no Previous sexually transmitted disease screening: yes Genital lesions: no Penile discharge: no Dysuria: no Swollen lymph nodes: no Fevers: no Rash: no   No Known Allergies  Outpatient Encounter Medications as of 04/28/2020  Medication Sig  . esomeprazole (NEXIUM) 20 MG capsule Take 1 capsule (20 mg total) by mouth daily at 12 noon.  Marland Kitchen ibuprofen (ADVIL) 600 MG tablet Take 1 tablet (600 mg total) by mouth every 8 (eight) hours as needed. Take with food   No facility-administered encounter medications on file as of 04/28/2020.    Patient Active Problem List   Diagnosis Date Noted  . Obesity (BMI 30-39.9) 01/07/2020  . Tobacco use 09/07/2019  . Lipoma of forehead 09/19/2015  . Sore throat 03/18/2012  . Seasonal allergies 11/09/2011    Past Medical History:  Diagnosis Date  . Beta thalassemia (HCC)     Relevant past medical, surgical, family and social history reviewed and updated as indicated. Interim medical history since our last visit reviewed.  Review of Systems  Constitutional: Negative.  Negative for activity change, appetite change and fever.  Gastrointestinal: Negative.  Negative for abdominal pain, anal bleeding and blood in stool.  Genitourinary: Positive for  testicular pain. Negative for decreased urine volume, difficulty urinating, dysuria, flank pain, frequency, genital sores, hematuria, penile discharge, penile pain, penile swelling, scrotal swelling and urgency.  Skin: Negative.   Neurological: Negative.   Psychiatric/Behavioral: Negative.     Per HPI unless specifically indicated above     Objective:    BP 130/82   Pulse 79   Temp 98.5 F (36.9 C) (Oral)   Ht 5\' 9"  (1.753 m)   Wt 204 lb 9.6 oz (92.8 kg)   SpO2 95%   BMI 30.21 kg/m   Wt Readings from Last 3 Encounters:  04/28/20 204 lb 9.6 oz (92.8 kg)  04/28/20 207 lb 14.3 oz (94.3 kg)  03/17/20 208 lb (94.3 kg)    Physical Exam Vitals and nursing note reviewed. Exam conducted with a chaperone present [employer] forces technical officer).  Constitutional:      General: He is not in acute distress.    Appearance: Normal appearance. He is not toxic-appearing.  Abdominal:     General: Abdomen is flat.     Hernia: There is no hernia in the left inguinal area or right inguinal area.  Genitourinary:    Penis: Circumcised. No erythema or discharge.      Testes: Normal.        Right: Mass, tenderness, swelling, testicular hydrocele or varicocele not present.        Left: Mass, tenderness, swelling, testicular hydrocele or varicocele not present.     Epididymis:     Right: Normal.     Left: Normal.  Lymphadenopathy:     Lower Body: No right inguinal adenopathy. No left inguinal adenopathy.  Skin:  General: Skin is warm and dry.     Capillary Refill: Capillary refill takes less than 2 seconds.     Coloration: Skin is not jaundiced or pale.     Findings: No erythema.  Neurological:     Mental Status: He is alert and oriented to person, place, and time.     Motor: No weakness.     Gait: Gait normal.  Psychiatric:        Mood and Affect: Mood normal.        Behavior: Behavior normal.        Thought Content: Thought content normal.        Judgment: Judgment normal.     Results for orders  placed or performed in visit on 04/28/20  Urinalysis, Routine w reflex microscopic  Result Value Ref Range   Color, Urine YELLOW YELLOW   APPearance CLOUDY (A) CLEAR   Specific Gravity, Urine 1.020 1.001 - 1.03   pH 7.5 5.0 - 8.0   Glucose, UA NEGATIVE NEGATIVE   Bilirubin Urine NEGATIVE NEGATIVE   Ketones, ur NEGATIVE NEGATIVE   Hgb urine dipstick NEGATIVE NEGATIVE   Protein, ur NEGATIVE NEGATIVE   Nitrite NEGATIVE NEGATIVE   Leukocytes,Ua NEGATIVE NEGATIVE      Assessment & Plan:  1. Pain in left testicle Acute.  Will obtain ultrasound of scrotum with Doppler.  Given recent unprotected sex, will also obtain gonorrhea chlamydia testing, syphilis, and HIV.  Urinalysis today in clinic negative.  May consider urology referral if ultrasound negative and symptoms persist. - Urinalysis, Routine w reflex microscopic - C. trachomatis/N. gonorrhoeae RNA - HIV Antibody (routine testing w rflx) - RPR - US SCROTUM DOPPLER; Future  2. Routine screening for STI (sexually transmitted infection) - C. trachomatis/N. gonorrhoeae RNA - HIV Antibody (routine testing w rflx) - RPR    Follow up plan: Return if symptoms worsen or fail to improve.

## 2020-04-29 ENCOUNTER — Telehealth: Payer: Self-pay | Admitting: *Deleted

## 2020-04-29 LAB — RPR: RPR Ser Ql: NONREACTIVE

## 2020-04-29 LAB — HIV ANTIBODY (ROUTINE TESTING W REFLEX): HIV 1&2 Ab, 4th Generation: NONREACTIVE

## 2020-04-29 NOTE — Addendum Note (Signed)
Addended by: Noemi Chapel A on: 04/29/2020 09:08 AM   Modules accepted: Orders

## 2020-04-29 NOTE — Telephone Encounter (Signed)
Transition Care Management Unsuccessful Follow-up Telephone Call  Date of discharge and from where:  [facility]  Attempts:  1st  Reason for unsuccessful TCM follow-up call:  No answer/ left message

## 2020-04-30 ENCOUNTER — Encounter: Payer: Self-pay | Admitting: Nurse Practitioner

## 2020-04-30 ENCOUNTER — Other Ambulatory Visit: Payer: Self-pay

## 2020-04-30 ENCOUNTER — Telehealth: Payer: Self-pay | Admitting: *Deleted

## 2020-04-30 ENCOUNTER — Other Ambulatory Visit: Payer: Self-pay | Admitting: *Deleted

## 2020-04-30 DIAGNOSIS — Z113 Encounter for screening for infections with a predominantly sexual mode of transmission: Secondary | ICD-10-CM

## 2020-04-30 DIAGNOSIS — N50812 Left testicular pain: Secondary | ICD-10-CM

## 2020-04-30 NOTE — Telephone Encounter (Signed)
Transition Care Management Unsuccessful Follow-up Telephone Call  Date of discharge and from where: 04-28-2020 Omar Norris  Attempts:  2nd  Reason for unsuccessful TCM follow-up call:   No answer / LM

## 2020-05-01 ENCOUNTER — Telehealth: Payer: Self-pay | Admitting: *Deleted

## 2020-05-01 NOTE — Telephone Encounter (Signed)
Transition Care Management Unsuccessful Follow-up Telephone Call  Date of discharge and from where:  04/28/2020 - [facility]  Attempts:  3rd Attempt  Reason for unsuccessful TCM follow-up call:  Left voice message

## 2020-05-02 LAB — C. TRACHOMATIS/N. GONORRHOEAE RNA
C. trachomatis RNA, TMA: NOT DETECTED
N. gonorrhoeae RNA, TMA: NOT DETECTED

## 2020-05-05 ENCOUNTER — Ambulatory Visit (HOSPITAL_COMMUNITY)
Admission: RE | Admit: 2020-05-05 | Discharge: 2020-05-05 | Disposition: A | Discharge status: Home / Self Care | Payer: Managed Care, Other (non HMO) | Source: Ambulatory Visit | Attending: Nurse Practitioner | Admitting: Nurse Practitioner

## 2020-05-05 ENCOUNTER — Other Ambulatory Visit: Payer: Self-pay

## 2020-05-05 DIAGNOSIS — N50812 Left testicular pain: Secondary | ICD-10-CM | POA: Diagnosis not present

## 2020-05-06 ENCOUNTER — Encounter: Payer: Self-pay | Admitting: Nurse Practitioner

## 2020-05-19 ENCOUNTER — Ambulatory Visit (INDEPENDENT_AMBULATORY_CARE_PROVIDER_SITE_OTHER): Payer: Self-pay | Admitting: Gastroenterology

## 2020-05-19 ENCOUNTER — Telehealth (INDEPENDENT_AMBULATORY_CARE_PROVIDER_SITE_OTHER): Payer: Self-pay

## 2020-05-19 ENCOUNTER — Encounter (INDEPENDENT_AMBULATORY_CARE_PROVIDER_SITE_OTHER): Payer: Self-pay | Admitting: Gastroenterology

## 2020-05-19 NOTE — Telephone Encounter (Signed)
Patient no showed for his appointment with Dr.Daniel Jenetta Downer with [facility] on 05/19/2020.

## 2020-07-08 ENCOUNTER — Other Ambulatory Visit: Payer: Self-pay

## 2020-07-08 ENCOUNTER — Encounter: Payer: Self-pay | Admitting: Emergency Medicine

## 2020-07-08 ENCOUNTER — Ambulatory Visit
Admission: EM | Admit: 2020-07-08 | Discharge: 2020-07-08 | Disposition: A | Discharge status: Home / Self Care | Payer: Managed Care, Other (non HMO) | Attending: Family Medicine | Admitting: Family Medicine

## 2020-07-08 DIAGNOSIS — Z113 Encounter for screening for infections with a predominantly sexual mode of transmission: Secondary | ICD-10-CM | POA: Diagnosis not present

## 2020-07-08 DIAGNOSIS — J302 Other seasonal allergic rhinitis: Secondary | ICD-10-CM | POA: Diagnosis present

## 2020-07-08 DIAGNOSIS — J029 Acute pharyngitis, unspecified: Secondary | ICD-10-CM | POA: Insufficient documentation

## 2020-07-08 MED ORDER — CETIRIZINE-PSEUDOEPHEDRINE ER 5-120 MG PO TB12: 1.0000 | ORAL_TABLET | Freq: Every day | ORAL | 0 refills | Status: DC

## 2020-07-08 MED ORDER — FLUTICASONE PROPIONATE 50 MCG/ACT NA SUSP: 2.0000 | Freq: Every day | NASAL | 2 refills | Status: DC

## 2020-07-08 NOTE — ED Provider Notes (Signed)
[facility]    CSN: [id number] Arrival date & time: 07/08/20  1922      History   Chief Complaint Chief Complaint  Patient presents with  . SEXUALLY TRANSMITTED DISEASE    HPI [name] is a 35 y.o. male.   Reports that he is concerned for STD exposure.  Denies dysuria, penile discharge, known exposure.  Also complains of allergy symptoms for the last 3 days.  Has not attempted OTC treatment.  Denies sick contacts.  Has negative history of COVID.  Has not completed COVID vaccines.  Has not completed flu vaccine.  Denies headache, shortness of breath, body aches, chills, abdominal pain, nausea, vomiting, diarrhea, rash, fever, other symptoms.  ROS per HPI  The history is provided by the patient.    Past Medical History:  Diagnosis Date  . Beta thalassemia [facility])     Patient Active Problem List   Diagnosis Date Noted  . Obesity (BMI 30-39.9) 01/07/2020  . Tobacco use 09/07/2019  . Lipoma of forehead 09/19/2015  . Sore throat 03/18/2012  . Seasonal allergies 11/09/2011    History reviewed. No pertinent surgical history.     Home Medications    Prior to Admission medications   Medication Sig Start Date End Date Taking? Authorizing Provider  cetirizine-pseudoephedrine (ZYRTEC-D) 5-120 MG tablet Take 1 tablet by mouth daily. 07/08/20  Yes Faustino Congress, NP  fluticasone (FLONASE) 50 MCG/ACT nasal spray Place 2 sprays into both nostrils daily. 07/08/20  Yes Faustino Congress, NP  esomeprazole (NEXIUM) 20 MG capsule Take 1 capsule (20 mg total) by mouth daily at 12 noon. 10/26/19   Alycia Rossetti, MD  ibuprofen (ADVIL) 600 MG tablet Take 1 tablet (600 mg total) by mouth every 8 (eight) hours as needed. Take with food 03/17/20   Alycia Rossetti, MD    Family History Family History  Problem Relation Age of Onset  . Hypertension Mother   . Hyperlipidemia Mother     Social History Social History   Tobacco Use  . Smoking status: Former  Smoker    Types: Cigars  . Smokeless tobacco: Never Used  . Tobacco comment: weekends  Vaping Use  . Vaping Use: Former  Substance Use Topics  . Alcohol use: Yes    Alcohol/week: 2.0 standard drinks    Types: 2 Cans of beer per week  . Drug use: No     Allergies   Patient has no known allergies.   Review of Systems Review of Systems   Physical Exam Triage Vital Signs ED Triage Vitals  Enc Vitals Group     BP 07/08/20 1943 (!) 145/90     Pulse Rate 07/08/20 1943 84     Resp 07/08/20 1943 18     Temp 07/08/20 1943 98.5 F (36.9 C)     Temp Source 07/08/20 1943 Oral     SpO2 07/08/20 1943 98 %     Weight --      Height --      Head Circumference --      Peak Flow --      Pain Score 07/08/20 1948 0     Pain Loc --      Pain Edu? --      Excl. in [place]? --    No data found.  Updated Vital Signs BP (!) 145/90   Pulse 84   Temp 98.5 F (36.9 C) (Oral)   Resp 18   SpO2 98%   Visual Acuity Right  Eye Distance:   Left Eye Distance:   Bilateral Distance:    Right Eye Near:   Left Eye Near:    Bilateral Near:     Physical Exam Vitals and nursing note reviewed.  Constitutional:      General: He is not in acute distress.    Appearance: Normal appearance. He is well-developed and normal weight. He is not ill-appearing.  HENT:     Head: Normocephalic and atraumatic.     Right Ear: Tympanic membrane, ear canal and external ear normal.     Left Ear: Tympanic membrane, ear canal and external ear normal.     Nose: Congestion present.     Mouth/Throat:     Mouth: Mucous membranes are moist.     Pharynx: Posterior oropharyngeal erythema present.  Eyes:     Extraocular Movements: Extraocular movements intact.     Conjunctiva/sclera: Conjunctivae normal.     Pupils: Pupils are equal, round, and reactive to light.  Cardiovascular:     Rate and Rhythm: Normal rate and regular rhythm.     Heart sounds: Normal heart sounds. No murmur heard.   Pulmonary:     Effort:  Pulmonary effort is normal. No respiratory distress.     Breath sounds: Normal breath sounds. No stridor. No wheezing, rhonchi or rales.  Chest:     Chest wall: No tenderness.  Musculoskeletal:        General: Normal range of motion.     Cervical back: Normal range of motion and neck supple.  Lymphadenopathy:     Cervical: No cervical adenopathy.  Skin:    General: Skin is warm and dry.     Capillary Refill: Capillary refill takes less than 2 seconds.  Neurological:     General: No focal deficit present.     Mental Status: He is alert and oriented to person, place, and time.  Psychiatric:        Mood and Affect: Mood normal.        Behavior: Behavior normal.        Thought Content: Thought content normal.      UC Treatments / Results  Labs (all labs ordered are listed, but only abnormal results are displayed) Labs Reviewed  HIV ANTIBODY (ROUTINE TESTING W REFLEX)   Narrative:    Performed at:  [facility] [address], [place], [place]  [id number] Lab Director: Rush Farmer MD, Phone:  [phone]  RPR   Narrative:    Performed at:  [facility] [address], [place], [place]  [id number] Lab Director: Rush Farmer MD, Phone:  [phone]  CYTOLOGY, (ORAL, ANAL, URETHRAL) ANCILLARY ONLY    EKG   Radiology No results found.  Procedures Procedures (including critical care time)  Medications Ordered in UC Medications - No data to display  Initial Impression / Assessment and Plan / UC Course  I have reviewed the triage vital signs and the nursing notes.  Pertinent labs & imaging results that were available during my care of the patient were reviewed by me and considered in my medical decision making (see chart for details).    STD screen Seasonal allergies Sore throat  Self cytology swab obtained RPR/HIV labs obtained today Will be in contact with any abnormal labs Do not have unprotected sex until results are back and negative,  or until treatment is completed whichever is longer Sent in Zyrtec-D for you to take daily Have sent in Flonase for you to take daily Your rapid  strep test is negative.  A throat culture is pending; we will call you if it is positive requiring treatment.   Follow up with this office or with primary care if symptoms are persisting.  Follow up in the ER for high fever, trouble swallowing, trouble breathing, other concerning symptoms.   Final Clinical Impressions(s) / UC Diagnoses   Final diagnoses:  Screen for STD (sexually transmitted disease)  Seasonal allergies  Sore throat     Discharge Instructions     I have sent in zyrtec D for you to take daily   I have sent in flonase for you to use 2 sprays per nostril daily as needed for nasal congestion.  Throat swab will be back in about 2 days and we will be in contact with any abnormal results that require further treatment  Follow up with this office or with primary care if symptoms are persisting.  Follow up in the ER for high fever, trouble swallowing, trouble breathing, other concerning symptoms.     ED Prescriptions    Medication Sig Dispense Auth. Provider   cetirizine-pseudoephedrine (ZYRTEC-D) 5-120 MG tablet Take 1 tablet by mouth daily. 30 tablet Faustino Congress, NP   fluticasone (FLONASE) 50 MCG/ACT nasal spray Place 2 sprays into both nostrils daily. 9.9 mL Faustino Congress, NP     PDMP not reviewed this encounter.   Faustino Congress, NP 07/15/20 1333

## 2020-07-08 NOTE — Discharge Instructions (Signed)
I have sent in zyrtec D for you to take daily   I have sent in flonase for you to use 2 sprays per nostril daily as needed for nasal congestion.  Throat swab will be back in about 2 days and we will be in contact with any abnormal results that require further treatment  Follow up with this office or with primary care if symptoms are persisting.  Follow up in the ER for high fever, trouble swallowing, trouble breathing, other concerning symptoms.

## 2020-07-09 LAB — CYTOLOGY, (ORAL, ANAL, URETHRAL) ANCILLARY ONLY
Chlamydia: NEGATIVE
Comment: NEGATIVE
Comment: NEGATIVE
Comment: NORMAL
Neisseria Gonorrhea: NEGATIVE
Trichomonas: NEGATIVE

## 2020-07-10 LAB — RPR: RPR Ser Ql: NONREACTIVE

## 2020-07-10 LAB — HIV ANTIBODY (ROUTINE TESTING W REFLEX): HIV Screen 4th Generation wRfx: NONREACTIVE

## 2020-09-04 ENCOUNTER — Other Ambulatory Visit: Payer: Self-pay

## 2020-09-04 ENCOUNTER — Ambulatory Visit
Admission: EM | Admit: 2020-09-04 | Discharge: 2020-09-04 | Disposition: A | Discharge status: Home / Self Care | Payer: Managed Care, Other (non HMO) | Attending: Emergency Medicine | Admitting: Emergency Medicine

## 2020-09-04 ENCOUNTER — Encounter: Payer: Self-pay | Admitting: Emergency Medicine

## 2020-09-04 DIAGNOSIS — R300 Dysuria: Secondary | ICD-10-CM | POA: Diagnosis present

## 2020-09-04 LAB — POCT URINALYSIS DIP (MANUAL ENTRY)
Bilirubin, UA: NEGATIVE
Blood, UA: NEGATIVE
Glucose, UA: NEGATIVE mg/dL
Ketones, POC UA: NEGATIVE mg/dL
Leukocytes, UA: NEGATIVE
Nitrite, UA: NEGATIVE
Protein Ur, POC: NEGATIVE mg/dL
Spec Grav, UA: 1.015 (ref 1.010–1.025)
Urobilinogen, UA: 0.2 E.U./dL
pH, UA: 7 (ref 5.0–8.0)

## 2020-09-04 MED ORDER — PHENAZOPYRIDINE HCL 200 MG PO TABS: 200.0000 mg | ORAL_TABLET | Freq: Three times a day (TID) | ORAL | 0 refills | Status: DC

## 2020-09-04 NOTE — ED Provider Notes (Signed)
[facility]   [id number] 09/04/20 Arrival Time: 6010   CC: burning with urination  SUBJECTIVE:  Omar Norris is a 35 y.o. male who presents with dysuria x few days.  Symptoms began after wearing different underwear.  Reports transfer of dye from underwear to penis and since then has had irritation.  Partner asymptomatic.  Last unprotected sexual encounter 1 day ago.  Has been sexually active with 2 partners over the past 6 months.  Denies similar symptoms in the past.  Denies fever, chills, nausea, vomiting, abdominal or pelvic pain, penile rashes or lesions, testicular swelling or pain.     No LMP for male patient.  ROS: As per HPI.  All other pertinent ROS negative.     Past Medical History:  Diagnosis Date   Beta thalassemia ([place])    History reviewed. No pertinent surgical history. No Known Allergies No current facility-administered medications on file prior to encounter.   Current Outpatient Medications on File Prior to Encounter  Medication Sig Dispense Refill   esomeprazole (NEXIUM) 20 MG capsule Take 1 capsule (20 mg total) by mouth daily at 12 noon.     ibuprofen (ADVIL) 600 MG tablet Take 1 tablet (600 mg total) by mouth every 8 (eight) hours as needed. Take with food 20 tablet 0   Social History   Socioeconomic History   Marital status: Single    Spouse name: Not on file   Number of children: Not on file   Years of education: Not on file   Highest education level: Not on file  Occupational History   Not on file  Tobacco Use   Smoking status: Former    Types: Cigars   Smokeless tobacco: Never   Tobacco comments:    weekends  Vaping Use   Vaping Use: Former  Substance and Sexual Activity   Alcohol use: Yes    Alcohol/week: 2.0 standard drinks    Types: 2 Cans of beer per week   Drug use: No   Sexual activity: Yes    Birth control/protection: Condom  Other Topics Concern   Not on file  Social History Narrative   Not on file   Social  Determinants of Health   Financial Resource Strain: Not on file  Food Insecurity: Not on file  Transportation Needs: Not on file  Physical Activity: Not on file  Stress: Not on file  Social Connections: Not on file  Intimate Partner Violence: Not on file   Family History  Problem Relation Age of Onset   Hypertension Mother    Hyperlipidemia Mother     OBJECTIVE:  Vitals:   09/04/20 1757  BP: (!) 147/79  Pulse: 83  Resp: 16  Temp: 98.6 F (37 C)  TempSrc: Oral  SpO2: 97%     General appearance: alert, NAD, appears stated age Head: NCAT Throat: lips, mucosa, and tongue normal; teeth and gums normal Lungs: CTA bilaterally without adventitious breath sounds Heart: regular rate and rhythm.  Back: no CVA tenderness Abdomen: soft, non-tender; bowel sounds normal; no guarding GU: deferred Skin: warm and dry Psychological:  Alert and cooperative. Normal mood and affect.  LABS:  Results for orders placed or performed during the hospital encounter of 09/04/20  POCT urinalysis dipstick  Result Value Ref Range   Color, UA yellow yellow   Clarity, UA clear clear   Glucose, UA negative negative mg/dL   Bilirubin, UA negative negative   Ketones, POC UA negative negative mg/dL   Spec Grav, UA  1.015 1.010 - 1.025   Blood, UA negative negative   pH, UA 7.0 5.0 - 8.0   Protein Ur, POC negative negative mg/dL   Urobilinogen, UA 0.2 0.2 or 1.0 E.U./dL   Nitrite, UA Negative Negative   Leukocytes, UA Negative Negative    Labs Reviewed  POCT URINALYSIS DIP (MANUAL ENTRY)  CYTOLOGY, (ORAL, ANAL, URETHRAL) ANCILLARY ONLY    ASSESSMENT & PLAN:  1. Dysuria     Meds ordered this encounter  Medications   phenazopyridine (PYRIDIUM) 200 MG tablet    Sig: Take 1 tablet (200 mg total) by mouth 3 (three) times daily.    Dispense:  6 tablet    Refill:  0    Order Specific Question:   Supervising Provider    Answer:   Raylene Everts [[id number]]    Pending: Labs Reviewed   POCT URINALYSIS DIP (MANUAL ENTRY)  CYTOLOGY, (ORAL, ANAL, URETHRAL) ANCILLARY ONLY   Urine without signs of infection Urethral swab obtained We will follow up with you regarding the results of your test Pyridium prescribed for discomfort If tests are positive, please abstain from sexual activity until you and your partner(s) are treated Follow up with PCP  Return here or go to ER if you have any new or worsening symptoms redness, swelling, discharge, lesions, ulcers, testicular pain, fever, chills, abdominal pain, etc...  Reviewed expectations re: course of current medical issues. Questions answered. Outlined signs and symptoms indicating need for more acute intervention. Patient verbalized understanding. After Visit Summary given.        Lestine Box, PA-C 09/04/20 1847

## 2020-09-04 NOTE — Discharge Instructions (Addendum)
Urine without signs of infection Urethral swab obtained We will follow up with you regarding the results of your test Pyridium prescribed for discomfort If tests are positive, please abstain from sexual activity until you and your partner(s) are treated Follow up with PCP  Return here or go to ER if you have any new or worsening symptoms redness, swelling, discharge, lesions, ulcers, testicular pain, fever, chills, abdominal pain, etc..Marland Kitchen

## 2020-09-04 NOTE — ED Triage Notes (Signed)
Burning on urination.  States he has changed the type of underware he is wearing and thinks he may be reacting to the dye in the underware.  No penile discharge.

## 2020-09-05 LAB — CYTOLOGY, (ORAL, ANAL, URETHRAL) ANCILLARY ONLY
Chlamydia: NEGATIVE
Comment: NEGATIVE
Comment: NEGATIVE
Comment: NORMAL
Neisseria Gonorrhea: NEGATIVE
Trichomonas: NEGATIVE

## 2020-09-06 ENCOUNTER — Encounter: Payer: Self-pay | Admitting: Emergency Medicine

## 2020-09-06 ENCOUNTER — Ambulatory Visit
Admission: EM | Admit: 2020-09-06 | Discharge: 2020-09-06 | Disposition: A | Discharge status: Home / Self Care | Payer: Managed Care, Other (non HMO) | Attending: Emergency Medicine | Admitting: Emergency Medicine

## 2020-09-06 DIAGNOSIS — R300 Dysuria: Secondary | ICD-10-CM | POA: Diagnosis present

## 2020-09-06 LAB — POCT URINALYSIS DIP (MANUAL ENTRY)
Bilirubin, UA: NEGATIVE
Blood, UA: NEGATIVE
Glucose, UA: 100 mg/dL — AB
Ketones, POC UA: NEGATIVE mg/dL
Leukocytes, UA: NEGATIVE
Nitrite, UA: POSITIVE — AB
Protein Ur, POC: NEGATIVE mg/dL
Spec Grav, UA: 1.01 (ref 1.010–1.025)
Urobilinogen, UA: 1 E.U./dL
pH, UA: 6.5 (ref 5.0–8.0)

## 2020-09-06 MED ORDER — SULFAMETHOXAZOLE-TRIMETHOPRIM 800-160 MG PO TABS: 1.0000 | ORAL_TABLET | Freq: Two times a day (BID) | ORAL | 0 refills | Status: AC

## 2020-09-06 NOTE — ED Provider Notes (Signed)
[facility]   CC: Burning with urination  SUBJECTIVE:  Omar Norris is a 35 y.o. male who complains of burning at tip of penis and irritation x few days. Symptoms began after wearing new boxers. Denies pain.  Has tried pyridium without relief.  STI and urine work-up unremarkable on 7/14.  Symptoms are made worse with urination.  Denies similar symptoms in the past.  Denies fever, chills, nausea, vomiting, abdominal pain, flank pain, hematuria.    LMP: No LMP for male patient.  ROS: As in HPI.  All other pertinent ROS negative.     Past Medical History:  Diagnosis Date   Beta thalassemia ([facility])    History reviewed. No pertinent surgical history. No Known Allergies No current facility-administered medications on file prior to encounter.   Current Outpatient Medications on File Prior to Encounter  Medication Sig Dispense Refill   esomeprazole (NEXIUM) 20 MG capsule Take 1 capsule (20 mg total) by mouth daily at 12 noon.     ibuprofen (ADVIL) 600 MG tablet Take 1 tablet (600 mg total) by mouth every 8 (eight) hours as needed. Take with food 20 tablet 0   phenazopyridine (PYRIDIUM) 200 MG tablet Take 1 tablet (200 mg total) by mouth 3 (three) times daily. 6 tablet 0   Social History   Socioeconomic History   Marital status: Single    Spouse name: Not on file   Number of children: Not on file   Years of education: Not on file   Highest education level: Not on file  Occupational History   Not on file  Tobacco Use   Smoking status: Former    Types: Cigars   Smokeless tobacco: Never   Tobacco comments:    weekends  Vaping Use   Vaping Use: Former  Substance and Sexual Activity   Alcohol use: Yes    Alcohol/week: 2.0 standard drinks    Types: 2 Cans of beer per week   Drug use: No   Sexual activity: Yes    Birth control/protection: Condom  Other Topics Concern   Not on file  Social History Narrative   Not on file   Social Determinants of Health    Financial Resource Strain: Not on file  Food Insecurity: Not on file  Transportation Needs: Not on file  Physical Activity: Not on file  Stress: Not on file  Social Connections: Not on file  Intimate Partner Violence: Not on file   Family History  Problem Relation Age of Onset   Hypertension Mother    Hyperlipidemia Mother     OBJECTIVE:  Vitals:   09/06/20 0934  BP: (!) 142/85  Pulse: 67  Resp: 16  Temp: 98.2 F (36.8 C)  TempSrc: Oral  SpO2: 99%   General appearance: AOx3 in no acute distress HEENT: NCAT.  Oropharynx clear.  Lungs: clear to auscultation bilaterally without adventitious breath sounds Heart: regular rate and rhythm.  Abdomen: soft; non-distended; no tenderness; bowel sounds present; no guarding  Rectal: Declines Extremities: no edema; symmetrical with no gross deformities Skin: warm and dry Neurologic: Ambulates from chair to exam table without difficulty Psychological: alert and cooperative; normal mood and affect  Labs Reviewed  URINE CULTURE  POCT URINALYSIS DIP (MANUAL ENTRY)   No results found for this or any previous visit (from the past 24 hour(s)).   ASSESSMENT & PLAN:  1. Dysuria     Meds ordered this encounter  Medications   sulfamethoxazole-trimethoprim (BACTRIM DS) 800-160 MG tablet  Sig: Take 1 tablet by mouth 2 (two) times daily for 10 days.    Dispense:  20 tablet    Refill:  0    Order Specific Question:   Supervising Provider    Answer:   Raylene Everts [[id number]]    Urine with possible signs of infection.  May be secondary to pyridium use.   Also concern for prostatitis given persistence of symptoms with negative urine and STI from last visit Urine culture sent.  We will call you with the results.   Push fluids and get plenty of rest.   Bactrim prescribed Take antibiotic as directed and to completion If symptoms persists despite treatment you will need to follow up with urology Return here or go to ER if you  have any new or worsening symptoms such as fever, worsening abdominal pain, nausea/vomiting, flank pain, etc...  Outlined signs and symptoms indicating need for more acute intervention. Patient verbalized understanding. After Visit Summary given.      Lestine Box, PA-C 09/06/20 [phone]

## 2020-09-06 NOTE — ED Triage Notes (Signed)
Patient states that burning and irritation from new boxers he was wearing that was green, since this two days ago still having burning - feels like soap in penis. No stds test negative - no discharge no blood

## 2020-09-06 NOTE — Discharge Instructions (Addendum)
Urine with possible signs of infection.  May be secondary to pyridium use.   Also concern for prostatitis given persistence of symptoms with negative urine and STI from last visit Urine culture sent.  We will call you with the results.   Push fluids and get plenty of rest.   Bactrim prescribed Take antibiotic as directed and to completion If symptoms persists despite treatment you will need to follow up with urology Return here or go to ER if you have any new or worsening symptoms such as fever, worsening abdominal pain, nausea/vomiting, flank pain, etc..Marland Kitchen

## 2020-09-07 LAB — URINE CULTURE: Culture: NO GROWTH

## 2020-12-02 ENCOUNTER — Encounter: Payer: Self-pay | Admitting: Emergency Medicine

## 2020-12-02 ENCOUNTER — Other Ambulatory Visit: Payer: Self-pay

## 2020-12-02 ENCOUNTER — Ambulatory Visit
Admission: EM | Admit: 2020-12-02 | Discharge: 2020-12-02 | Disposition: A | Discharge status: Home / Self Care | Payer: 59 | Attending: Internal Medicine | Admitting: Internal Medicine

## 2020-12-02 DIAGNOSIS — Z113 Encounter for screening for infections with a predominantly sexual mode of transmission: Secondary | ICD-10-CM | POA: Insufficient documentation

## 2020-12-02 LAB — POCT URINALYSIS DIP (MANUAL ENTRY)
Bilirubin, UA: NEGATIVE
Blood, UA: NEGATIVE
Glucose, UA: NEGATIVE mg/dL
Leukocytes, UA: NEGATIVE
Nitrite, UA: NEGATIVE
Spec Grav, UA: 1.025 (ref 1.010–1.025)
Urobilinogen, UA: 1 E.U./dL
pH, UA: 6.5 (ref 5.0–8.0)

## 2020-12-02 NOTE — ED Triage Notes (Signed)
Groin pain on left side with lower abd pain and lower back pain.  Wants an STD check completed.  Hx of UTI 1.5 months ago.

## 2020-12-02 NOTE — Discharge Instructions (Addendum)
Increase oral fluid intake We will call you with recommendations if labs are abnormal Return to urgent care if you have any symptoms.

## 2020-12-03 LAB — HIV ANTIBODY (ROUTINE TESTING W REFLEX): HIV Screen 4th Generation wRfx: NONREACTIVE

## 2020-12-03 LAB — RPR: RPR Ser Ql: NONREACTIVE

## 2020-12-03 LAB — CYTOLOGY, (ORAL, ANAL, URETHRAL) ANCILLARY ONLY
Chlamydia: NEGATIVE
Comment: NEGATIVE
Comment: NEGATIVE
Comment: NORMAL
Neisseria Gonorrhea: NEGATIVE
Trichomonas: NEGATIVE

## 2020-12-05 NOTE — ED Provider Notes (Signed)
[facility]    CSN: [id number] Arrival date & time: 12/02/20  1323      History   Chief Complaint No chief complaint on file.   HPI Omar Norris is a 35 y.o. male comes to the urgent care complaining of left-sided groin pain and lower abdominal pain as well as lower back pain.  Patient is sexually active and engages in unprotected sexual intercourse.  He wants STD screening done.  Pain appears mild.  Patient is able to ambulate.  No aggravating or relieving factors.  He denies dysuria urgency or frequency.   HPI  Past Medical History:  Diagnosis Date   Beta thalassemia [facility])     Patient Active Problem List   Diagnosis Date Noted   Obesity (BMI 30-39.9) 01/07/2020   Tobacco use 09/07/2019   Lipoma of forehead 09/19/2015   Sore throat 03/18/2012   Seasonal allergies 11/09/2011    History reviewed. No pertinent surgical history.     Home Medications    Prior to Admission medications   Medication Sig Start Date End Date Taking? Authorizing Provider  esomeprazole (NEXIUM) 20 MG capsule Take 1 capsule (20 mg total) by mouth daily at 12 noon. 10/26/19   Alycia Rossetti, MD  ibuprofen (ADVIL) 600 MG tablet Take 1 tablet (600 mg total) by mouth every 8 (eight) hours as needed. Take with food 03/17/20   Alycia Rossetti, MD    Family History Family History  Problem Relation Age of Onset   Hypertension Mother    Hyperlipidemia Mother     Social History Social History   Tobacco Use   Smoking status: Former    Types: Cigars   Smokeless tobacco: Never   Tobacco comments:    weekends  Vaping Use   Vaping Use: Former  Substance Use Topics   Alcohol use: Yes    Alcohol/week: 2.0 standard drinks    Types: 2 Cans of beer per week   Drug use: No     Allergies   Patient has no known allergies.   Review of Systems Review of Systems As per HPI  Physical Exam Triage Vital Signs ED Triage Vitals  Enc Vitals Group     BP 12/02/20 1445 (!)  152/80     Pulse Rate 12/02/20 1445 87     Resp 12/02/20 1445 18     Temp 12/02/20 1445 98.2 F (36.8 C)     Temp Source 12/02/20 1445 Oral     SpO2 12/02/20 1445 98 %     Weight --      Height --      Head Circumference --      Peak Flow --      Pain Score 12/02/20 1447 6     Pain Loc --      Pain Edu? --      Excl. in [place]? --    No data found.  Updated Vital Signs BP (!) 152/80 (BP Location: Right Arm)   Pulse 87   Temp 98.2 F (36.8 C) (Oral)   Resp 18   SpO2 98%   Visual Acuity Right Eye Distance:   Left Eye Distance:   Bilateral Distance:    Right Eye Near:   Left Eye Near:    Bilateral Near:     Physical Exam Vitals and nursing note reviewed.  Constitutional:      General: He is not in acute distress.    Appearance: Normal appearance. He is not ill-appearing.  Cardiovascular:     Rate and Rhythm: Normal rate and regular rhythm.  Pulmonary:     Effort: Pulmonary effort is normal.     Breath sounds: Normal breath sounds.  Abdominal:     General: Bowel sounds are normal.     Palpations: Abdomen is soft.  Neurological:     Mental Status: He is alert.     UC Treatments / Results  Labs (all labs ordered are listed, but only abnormal results are displayed) Labs Reviewed  POCT URINALYSIS DIP (MANUAL ENTRY) - Abnormal; Notable for the following components:      Result Value   Ketones, POC UA trace (5) (*)    Protein Ur, POC trace (*)    All other components within normal limits  RPR   Narrative:    Performed at:  [address] [place], [place]  [id number] Lab Director: Rush Farmer MD, Phone:  [phone]  HIV ANTIBODY (ROUTINE TESTING W REFLEX)   Narrative:    Performed at:  [facility] [address] [place], [place]  [id number] Lab Director: Rush Farmer MD, Phone:  [phone]  CYTOLOGY, (ORAL, ANAL, URETHRAL) ANCILLARY ONLY    EKG   Radiology No results found.  Procedures Procedures (including  critical care time)  Medications Ordered in UC Medications - No data to display  Initial Impression / Assessment and Plan / UC Course  I have reviewed the triage vital signs and the nursing notes.  Pertinent labs & imaging results that were available during my care of the patient were reviewed by me and considered in my medical decision making (see chart for details).     1.  STD screening: Cytology for GC/chlamydia/trichomonas Point-of-care urineis negative for UTI We will call patient with recommendations if labs are abnormal Safe sex practices advised. Final Clinical Impressions(s) / UC Diagnoses   Final diagnoses:  Screen for STD (sexually transmitted disease)     Discharge Instructions      Increase oral fluid intake We will call you with recommendations if labs are abnormal Return to urgent care if you have any symptoms.   ED Prescriptions   None    PDMP not reviewed this encounter.   Chase Picket, MD 12/05/20 [phone]

## 2020-12-08 ENCOUNTER — Other Ambulatory Visit: Payer: Self-pay

## 2020-12-08 ENCOUNTER — Emergency Department: Payer: 59

## 2020-12-08 ENCOUNTER — Emergency Department
Admission: EM | Admit: 2020-12-08 | Discharge: 2020-12-08 | Disposition: A | Discharge status: Home / Self Care | Payer: 59 | Attending: Emergency Medicine | Admitting: Emergency Medicine

## 2020-12-08 DIAGNOSIS — Z87891 Personal history of nicotine dependence: Secondary | ICD-10-CM | POA: Diagnosis not present

## 2020-12-08 DIAGNOSIS — N5082 Scrotal pain: Secondary | ICD-10-CM | POA: Insufficient documentation

## 2020-12-08 LAB — URINALYSIS, COMPLETE (UACMP) WITH MICROSCOPIC
Bacteria, UA: NONE SEEN
Bilirubin Urine: NEGATIVE
Glucose, UA: NEGATIVE mg/dL
Hgb urine dipstick: NEGATIVE
Ketones, ur: NEGATIVE mg/dL
Leukocytes,Ua: NEGATIVE
Nitrite: NEGATIVE
Protein, ur: NEGATIVE mg/dL
Specific Gravity, Urine: 1.021 (ref 1.005–1.030)
pH: 6 (ref 5.0–8.0)

## 2020-12-08 MED ORDER — IBUPROFEN 400 MG PO TABS
400.0000 mg | ORAL_TABLET | Freq: Once | ORAL | Status: AC
  Administered 2020-12-08: 400 mg via ORAL
  Filled 2020-12-08: qty 1

## 2020-12-08 NOTE — ED Triage Notes (Signed)
Pt states that he pulled his groin at work awhile back and was evaluated and diagnosed with a strain, pt states that he recently started having pain again in the past 2 weeks, pt states that the pain is worse in his left testicle and radiates into bilat sides of abd and bilat flanks. Pt states that his left testicle isn't as descended as the right

## 2020-12-08 NOTE — Discharge Instructions (Addendum)
The ultrasound of your scrotum did not show any abnormality of the scrotum or testicles.  I am not sure what is exactly causing your pain, may be due to muscle strain.  You can take Tylenol and Motrin for your pain.  I also, and scrotal support.  If you continue to have ongoing pain, please follow-up with urology.

## 2020-12-08 NOTE — ED Notes (Signed)
Says had strained his groin couple months ago.  It was better. 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  The pain started in left testicle--to lower abdomen about 2 weeks ago.  Abd soft and non tender.  No urinary problems.

## 2020-12-08 NOTE — ED Provider Notes (Signed)
[facility]  ____________________________________________   Event Date/Time   First MD Initiated Contact with Patient 12/08/20 1235     (approximate)  I have reviewed the triage vital signs and the nursing notes.   HISTORY  Chief Complaint Testicle Pain    HPI Omar Norris is a 35 y.o. male past medical history of beta thalassemia who presents with scrotal pain.  Symptoms started about 2 weeks ago.  Pain is localized left scrotum but does radiate into the bilateral lower quadrants as well as the left back.  He denies any preceding injury.  Denies nausea, vomiting or urinary symptoms.  Has been tested for STDs and is not concerned about this today.  Has been taking ibuprofen.  Has history of similar which she attributed to muscle strain.         Past Medical History:  Diagnosis Date   Beta thalassemia [facility])     Patient Active Problem List   Diagnosis Date Noted   Obesity (BMI 30-39.9) 01/07/2020   Tobacco use 09/07/2019   Lipoma of forehead 09/19/2015   Sore throat 03/18/2012   Seasonal allergies 11/09/2011    No past surgical history on file.  Prior to Admission medications   Medication Sig Start Date End Date Taking? Authorizing Provider  esomeprazole (NEXIUM) 20 MG capsule Take 1 capsule (20 mg total) by mouth daily at 12 noon. 10/26/19   Alycia Rossetti, MD  ibuprofen (ADVIL) 600 MG tablet Take 1 tablet (600 mg total) by mouth every 8 (eight) hours as needed. Take with food 03/17/20   Alycia Rossetti, MD    Allergies Patient has no known allergies.  Family History  Problem Relation Age of Onset   Hypertension Mother    Hyperlipidemia Mother     Social History Social History   Tobacco Use   Smoking status: Former    Types: Cigars   Smokeless tobacco: Never   Tobacco comments:    weekends  Vaping Use   Vaping Use: Former  Substance Use Topics   Alcohol use: Yes    Alcohol/week: 2.0 standard drinks    Types: 2 Cans of  beer per week   Drug use: No    Review of Systems   Review of Systems  Constitutional:  Negative for chills and fever.  Gastrointestinal:  Negative for abdominal pain, nausea and vomiting.  Genitourinary:  Positive for flank pain and testicular pain. Negative for difficulty urinating, dysuria, hematuria, penile discharge, penile pain and scrotal swelling.  All other systems reviewed and are negative.  Physical Exam Updated Vital Signs BP 125/86 (BP Location: Right Arm)   Pulse 67   Temp 98.2 F (36.8 C) (Oral)   Resp 17   Ht 5\' 9"  (1.753 m)   Wt 93 kg   SpO2 98%   BMI 30.27 kg/m   Physical Exam Vitals and nursing note reviewed.  Constitutional:      General: He is not in acute distress.    Appearance: Normal appearance.  HENT:     Head: Normocephalic and atraumatic.  Eyes:     General: No scleral icterus.    Conjunctiva/sclera: Conjunctivae normal.  Pulmonary:     Effort: Pulmonary effort is normal. No respiratory distress.     Breath sounds: Normal breath sounds. No wheezing.  Abdominal:     General: Abdomen is flat. There is no distension.     Palpations: Abdomen is soft.     Tenderness: There is no abdominal tenderness.  There is no right CVA tenderness, left CVA tenderness or guarding.  Genitourinary:    Comments: Significant scrotal swelling or erythema, left hemiscrotum is minimally tender to palpation Musculoskeletal:        General: No deformity or signs of injury.     Cervical back: Normal range of motion.  Skin:    Coloration: Skin is not jaundiced or pale.  Neurological:     General: No focal deficit present.     Mental Status: He is alert and oriented to person, place, and time. Mental status is at baseline.  Psychiatric:        Mood and Affect: Mood normal.        Behavior: Behavior normal.     LABS (all labs ordered are listed, but only abnormal results are displayed)  Labs Reviewed  URINALYSIS, COMPLETE (UACMP) WITH MICROSCOPIC - Abnormal;  Notable for the following components:      Result Value   Color, Urine YELLOW (*)    APPearance CLEAR (*)    All other components within normal limits   ____________________________________________  EKG  N/a ____________________________________________  RADIOLOGY Almeta Monas, personally viewed and evaluated these images (plain radiographs) as part of my medical decision making, as well as reviewing the written report by the radiologist.  ED MD interpretation: I reviewed the scrotal ultrasound which is negative for acute pathology    ____________________________________________   PROCEDURES  Procedure(s) performed (including Critical Care):  Procedures   ____________________________________________   INITIAL IMPRESSION / ASSESSMENT AND PLAN / ED COURSE     Patient is a 35 year old male who presents with left scrotal pain x2 weeks.  No significant swelling erythema or urinary symptoms.  On exam he appears very comfortable.  Left hemiscrotum is nonswollen, no evidence of infection and minimally tender.  Abdomen is also nontender and he has no CVA tenderness.  UA without evidence of infection.  Scrotal ultrasound obtained which not show any evidence of torsion or infection or other acute pathology.  Etiology of this pain somewhat unclear.  I advised NSAIDs and scrotal support follow-up with urology if he has ongoing pain.      ____________________________________________   FINAL CLINICAL IMPRESSION(S) / ED DIAGNOSES  Final diagnoses:  Scrotal pain     ED Discharge Orders     None        Note:  This document was prepared using Dragon voice recognition software and may include unintentional dictation errors.    Rada Hay, MD 12/08/20 [phone]

## 2020-12-14 ENCOUNTER — Encounter: Payer: Self-pay | Admitting: Emergency Medicine

## 2020-12-14 ENCOUNTER — Emergency Department: Payer: 59

## 2020-12-14 ENCOUNTER — Other Ambulatory Visit: Payer: Self-pay

## 2020-12-14 ENCOUNTER — Emergency Department
Admission: EM | Admit: 2020-12-14 | Discharge: 2020-12-14 | Disposition: A | Discharge status: Home / Self Care | Payer: 59 | Attending: Emergency Medicine | Admitting: Emergency Medicine

## 2020-12-14 DIAGNOSIS — Z87891 Personal history of nicotine dependence: Secondary | ICD-10-CM | POA: Insufficient documentation

## 2020-12-14 DIAGNOSIS — R1030 Lower abdominal pain, unspecified: Secondary | ICD-10-CM | POA: Diagnosis present

## 2020-12-14 LAB — CBC
HCT: 43.4 % (ref 39.0–52.0)
Hemoglobin: 13.6 g/dL (ref 13.0–17.0)
MCH: 20.1 pg — ABNORMAL LOW (ref 26.0–34.0)
MCHC: 31.3 g/dL (ref 30.0–36.0)
MCV: 64.2 fL — ABNORMAL LOW (ref 80.0–100.0)
Platelets: 154 10*3/uL (ref 150–400)
RBC: 6.76 MIL/uL — ABNORMAL HIGH (ref 4.22–5.81)
RDW: 17.8 % — ABNORMAL HIGH (ref 11.5–15.5)
WBC: 5.4 10*3/uL (ref 4.0–10.5)
nRBC: 0 % (ref 0.0–0.2)

## 2020-12-14 LAB — URINALYSIS, ROUTINE W REFLEX MICROSCOPIC
Bilirubin Urine: NEGATIVE
Glucose, UA: NEGATIVE mg/dL
Hgb urine dipstick: NEGATIVE
Ketones, ur: NEGATIVE mg/dL
Leukocytes,Ua: NEGATIVE
Nitrite: NEGATIVE
Protein, ur: NEGATIVE mg/dL
Specific Gravity, Urine: 1.024 (ref 1.005–1.030)
pH: 6 (ref 5.0–8.0)

## 2020-12-14 LAB — COMPREHENSIVE METABOLIC PANEL
ALT: 39 U/L (ref 0–44)
AST: 25 U/L (ref 15–41)
Albumin: 4.3 g/dL (ref 3.5–5.0)
Alkaline Phosphatase: 95 U/L (ref 38–126)
Anion gap: 12 (ref 5–15)
BUN: 16 mg/dL (ref 6–20)
CO2: 26 mmol/L (ref 22–32)
Calcium: 8.9 mg/dL (ref 8.9–10.3)
Chloride: 100 mmol/L (ref 98–111)
Creatinine, Ser: 0.95 mg/dL (ref 0.61–1.24)
GFR, Estimated: >60 mL/min (ref >60)
Glucose, Bld: 88 mg/dL (ref 70–99)
Potassium: 4 mmol/L (ref 3.5–5.1)
Sodium: 138 mmol/L (ref 135–145)
Total Bilirubin: 1.5 mg/dL — ABNORMAL HIGH (ref 0.3–1.2)
Total Protein: 7.3 g/dL (ref 6.5–8.1)

## 2020-12-14 LAB — LIPASE, BLOOD: Lipase: 34 U/L (ref 11–51)

## 2020-12-14 NOTE — ED Triage Notes (Signed)
Pt reports was seen here for groin pain and now has pain in his lower abd that radiates around to his back.

## 2020-12-14 NOTE — ED Provider Notes (Signed)
[facility] Emergency Department Provider Note  Time seen: 12:25 PM  I have reviewed the triage vital signs and the nursing notes.   HISTORY  Chief Complaint Abdominal Pain   HPI Omar Norris is a 35 y.o. male with no significant past medical history presents emergency department for lower abdominal discomfort.  According to the patient for the past week or 2 he has been experiencing pain across the lower abdomen and sometimes into the scrotum.  Patient has been seen twice for the same thing here and has had a negative work-up both times per patient including a urine sample and ultrasound of the scrotum and STD check.  Patient was told is likely muscular strain or groin strain.  He states today the pain seemed to radiate to his back he was concerned that we could be missing something so he came to the emergency department for evaluation.  Patient denies any fever, nausea vomiting or diarrhea.  Denies any dysuria or penile discharge.   Past Medical History:  Diagnosis Date   Beta thalassemia [facility])     Patient Active Problem List   Diagnosis Date Noted   Obesity (BMI 30-39.9) 01/07/2020   Tobacco use 09/07/2019   Lipoma of forehead 09/19/2015   Sore throat 03/18/2012   Seasonal allergies 11/09/2011    History reviewed. No pertinent surgical history.  Prior to Admission medications   Medication Sig Start Date End Date Taking? Authorizing Provider  esomeprazole (NEXIUM) 20 MG capsule Take 1 capsule (20 mg total) by mouth daily at 12 noon. 10/26/19   Alycia Rossetti, MD  ibuprofen (ADVIL) 600 MG tablet Take 1 tablet (600 mg total) by mouth every 8 (eight) hours as needed. Take with food 03/17/20   Alycia Rossetti, MD    No Known Allergies  Family History  Problem Relation Age of Onset   Hypertension Mother    Hyperlipidemia Mother     Social History Social History   Tobacco Use   Smoking status: Former    Types: Cigars   Smokeless tobacco:  Never   Tobacco comments:    weekends  Vaping Use   Vaping Use: Former  Substance Use Topics   Alcohol use: Yes    Alcohol/week: 2.0 standard drinks    Types: 2 Cans of beer per week   Drug use: No    Review of Systems Constitutional: Negative for fever. Cardiovascular: Negative for chest pain. Respiratory: Negative for shortness of breath. Gastrointestinal: Intermittent lower abdominal pain.  Negative for nausea vomiting or diarrhea Genitourinary: Negative for urinary compaints Musculoskeletal: Negative for musculoskeletal complaints Neurological: Negative for headache All other ROS negative  ____________________________________________   PHYSICAL EXAM:  VITAL SIGNS: ED Triage Vitals  Enc Vitals Group     BP 12/14/20 1051 140/89     Pulse Rate 12/14/20 1051 76     Resp 12/14/20 1051 20     Temp 12/14/20 1053 98.3 F (36.8 C)     Temp Source 12/14/20 1053 Oral     SpO2 12/14/20 1051 100 %     Weight 12/14/20 1051 205 lb (93 kg)     Height 12/14/20 1051 5\' 9"  (1.753 m)     Head Circumference --      Peak Flow --      Pain Score 12/14/20 1051 6     Pain Loc --      Pain Edu? --      Excl. in [place]? --    Constitutional:  Alert and oriented. Well appearing and in no distress. Eyes: Normal exam ENT      Head: Normocephalic and atraumatic.      Mouth/Throat: Mucous membranes are moist. Cardiovascular: Normal rate, regular rhythm.  Respiratory: Normal respiratory effort without tachypnea nor retractions. Breath sounds are clear  Gastrointestinal: Soft and nontender. No distention.  Benign abdominal exam. Musculoskeletal: Nontender with normal range of motion in all extremities. Neurologic:  Normal speech and language. No gross focal neurologic deficits  Skin:  Skin is warm, dry and intact.  Psychiatric: Mood and affect are normal.   ____________________________________________    RADIOLOGY  CT scans essentially negative for acute  abnormality.  ____________________________________________   INITIAL IMPRESSION / ASSESSMENT AND PLAN / ED COURSE  Pertinent labs & imaging results that were available during my care of the patient were reviewed by me and considered in my medical decision making (see chart for details).   Presents emergency department for continued lower abdominal discomfort radiating to the back somewhat.  Patient states has been ongoing for the past 2 weeks but appears to be getting worse.  Patient's recent work-ups include normal STD test, normal urinalysis.  We will check lab work today as well as a CT scan to further evaluate.  Patient agreeable plan of care.  Patient's work-up is essentially normal.  Blood work is normal.  CT scan is normal.  Urinalysis is normal.  Given the reassuring work-up I believe the patient will be safe for discharge home with outpatient follow-up.  Discussed this plan.  The patient is agreeable.  Omar Norris was evaluated in Emergency Department on 12/14/2020 for the symptoms described in the history of present illness. He was evaluated in the context of the global COVID-19 pandemic, which necessitated consideration that the patient might be at risk for infection with the SARS-CoV-2 virus that causes COVID-19. Institutional protocols and algorithms that pertain to the evaluation of patients at risk for COVID-19 are in a state of rapid change based on information released by regulatory bodies including the CDC and federal and state organizations. These policies and algorithms were followed during the patient's care in the ED.  ____________________________________________   FINAL CLINICAL IMPRESSION(S) / ED DIAGNOSES  Lower abdominal pain   Harvest Dark, MD 12/14/20 1329

## 2021-01-25 ENCOUNTER — Emergency Department: Payer: 59

## 2021-01-25 ENCOUNTER — Other Ambulatory Visit: Payer: Self-pay

## 2021-01-25 DIAGNOSIS — R209 Unspecified disturbances of skin sensation: Secondary | ICD-10-CM | POA: Insufficient documentation

## 2021-01-25 DIAGNOSIS — Z87891 Personal history of nicotine dependence: Secondary | ICD-10-CM | POA: Insufficient documentation

## 2021-01-25 DIAGNOSIS — M79602 Pain in left arm: Secondary | ICD-10-CM | POA: Diagnosis not present

## 2021-01-25 DIAGNOSIS — R0789 Other chest pain: Secondary | ICD-10-CM | POA: Insufficient documentation

## 2021-01-25 NOTE — ED Triage Notes (Signed)
Pt states chest pain since yesterday. Pt states today began to have left arm pain and some numbness in pinky finger. Pt denies shob, nausea, vomiting.

## 2021-01-26 ENCOUNTER — Emergency Department
Admission: EM | Admit: 2021-01-26 | Discharge: 2021-01-26 | Disposition: A | Discharge status: Home / Self Care | Payer: 59 | Attending: Emergency Medicine | Admitting: Emergency Medicine

## 2021-01-26 DIAGNOSIS — R079 Chest pain, unspecified: Secondary | ICD-10-CM

## 2021-01-26 LAB — CBC
HCT: 40.9 % (ref 39.0–52.0)
Hemoglobin: 12.5 g/dL — ABNORMAL LOW (ref 13.0–17.0)
MCH: 19.4 pg — ABNORMAL LOW (ref 26.0–34.0)
MCHC: 30.6 g/dL (ref 30.0–36.0)
MCV: 63.6 fL — ABNORMAL LOW (ref 80.0–100.0)
Platelets: 242 10*3/uL (ref 150–400)
RBC: 6.43 MIL/uL — ABNORMAL HIGH (ref 4.22–5.81)
RDW: 16.8 % — ABNORMAL HIGH (ref 11.5–15.5)
WBC: 7 10*3/uL (ref 4.0–10.5)
nRBC: 0 % (ref 0.0–0.2)

## 2021-01-26 LAB — BASIC METABOLIC PANEL
Anion gap: 5 (ref 5–15)
BUN: 15 mg/dL (ref 6–20)
CO2: 28 mmol/L (ref 22–32)
Calcium: 9.1 mg/dL (ref 8.9–10.3)
Chloride: 103 mmol/L (ref 98–111)
Creatinine, Ser: 0.87 mg/dL (ref 0.61–1.24)
GFR, Estimated: >60 mL/min (ref >60)
Glucose, Bld: 98 mg/dL (ref 70–99)
Potassium: 4 mmol/L (ref 3.5–5.1)
Sodium: 136 mmol/L (ref 135–145)

## 2021-01-26 LAB — TROPONIN I (HIGH SENSITIVITY)
Troponin I (High Sensitivity): 3 ng/L (ref <18)
Troponin I (High Sensitivity): 4 ng/L (ref <18)

## 2021-01-26 NOTE — ED Provider Notes (Signed)
Omar Norris  ____________________________________________   Event Date/Time   First MD Initiated Contact with Patient 01/26/21 [phone]     (approximate)  I have reviewed the triage vital signs and the nursing notes.   HISTORY  Chief Complaint Chest Pain    HPI Omar Norris is a 35 y.o. male who is otherwise healthy with no known medical problem except as listed below.  He reports onset about 24 hours ago of chest discomfort initially on the right side but then on the left that felt dull and burning like it might be acid reflux.  However when he continued and then he started having some pain in his left arm he was concerned he might be having a heart problem.  He said the symptoms have resolved by now except that his left pinky feels a little bit numb.  He has no focal weakness.  He denies fever, sore throat, shortness of breath, nausea, vomiting, abdominal pain, and dysuria.  He has not had similar symptoms in the past.  He has no family history of heart attacks or heart disease in general.  Nothing in particular made the symptoms better or worse and they were moderate in severity.     Past Medical History:  Diagnosis Date   Beta thalassemia [facility])     Patient Active Problem List   Diagnosis Date Noted   Obesity (BMI 30-39.9) 01/07/2020   Tobacco use 09/07/2019   Lipoma of forehead 09/19/2015   Sore throat 03/18/2012   Seasonal allergies 11/09/2011    No past surgical history on file.  Prior to Admission medications   Medication Sig Start Date End Date Taking? Authorizing Provider  esomeprazole (NEXIUM) 20 MG capsule Take 1 capsule (20 mg total) by mouth daily at 12 noon. 10/26/19   Alycia Rossetti, MD  ibuprofen (ADVIL) 600 MG tablet Take 1 tablet (600 mg total) by mouth every 8 (eight) hours as needed. Take with food 03/17/20   Alycia Rossetti, MD    Allergies Patient has no known allergies.  Family History   Problem Relation Age of Onset   Hypertension Mother    Hyperlipidemia Mother     Social History Social History   Tobacco Use   Smoking status: Former    Types: Cigars   Smokeless tobacco: Never   Tobacco comments:    weekends  Vaping Use   Vaping Use: Former  Substance Use Topics   Alcohol use: Yes    Alcohol/week: 2.0 standard drinks    Types: 2 Cans of beer per week   Drug use: No    Review of Systems Constitutional: No fever/chills Eyes: No visual changes. ENT: No sore throat. Cardiovascular: Positive for chest pain. Respiratory: Denies shortness of breath. Gastrointestinal: No abdominal pain.  No nausea, no vomiting.  No diarrhea.  No constipation. Genitourinary: Negative for dysuria. Musculoskeletal: Negative for neck pain.  Negative for back pain. Integumentary: Negative for rash. Neurological: Positive for numbness in his left little finger.  Negative for focal weakness.   ____________________________________________   PHYSICAL EXAM:  VITAL SIGNS: ED Triage Vitals  Enc Vitals Group     BP 01/25/21 2332 134/87     Pulse Rate 01/25/21 2332 75     Resp 01/25/21 2332 16     Temp 01/25/21 2332 98.6 F (37 C)     Temp Source 01/25/21 2332 Oral     SpO2 01/25/21 2332 98 %     Weight  01/25/21 2338 93 kg (205 lb)     Height 01/25/21 2338 1.753 m (5\' 9" )     Head Circumference --      Peak Flow --      Pain Score 01/25/21 2338 5     Pain Loc --      Pain Edu? --      Excl. in [place]? --     Constitutional: Alert and oriented.  Eyes: Conjunctivae are normal.  Head: Atraumatic. Nose: No congestion/rhinnorhea. Mouth/Throat: Patient is wearing a mask. Neck: No stridor.  No meningeal signs.   Cardiovascular: Normal rate, regular rhythm. Good peripheral circulation. Respiratory: Normal respiratory effort.  No retractions. Gastrointestinal: Soft and nontender. No distention.  Musculoskeletal: No lower extremity tenderness nor edema. No gross deformities of  extremities. Neurologic:  Normal speech and language. No gross focal neurologic deficits are appreciated.  Skin:  Skin is warm, dry and intact. Psychiatric: Mood and affect are normal. Speech and behavior are normal.  ____________________________________________   LABS (all labs ordered are listed, but only abnormal results are displayed)  Labs Reviewed  CBC - Abnormal; Notable for the following components:      Result Value   RBC 6.43 (*)    Hemoglobin 12.5 (*)    MCV 63.6 (*)    MCH 19.4 (*)    RDW 16.8 (*)    All other components within normal limits  BASIC METABOLIC PANEL  TROPONIN I (HIGH SENSITIVITY)  TROPONIN I (HIGH SENSITIVITY)   ____________________________________________  EKG  ED ECG REPORT I, Hinda Kehr, the attending physician, personally viewed and interpreted this ECG.  Date: 01/25/2021 EKG Time: 23:47 Rate: 74 Rhythm: normal sinus rhythm QRS Axis: normal Intervals: normal ST/T Wave abnormalities: Non-specific ST segment / T-wave changes, but no clear evidence of acute ischemia. Narrative Interpretation: no definitive evidence of acute ischemia; does not meet STEMI criteria.  ____________________________________________  RADIOLOGY I, Hinda Kehr, personally viewed and evaluated these images (plain radiographs) as part of my medical decision making, as well as reviewing the written report by the radiologist.  ED MD interpretation: No acute abnormalities  Official radiology report(s): DG Chest 2 View  Result Date: 01/25/2021 CLINICAL DATA:  Chest pain EXAM: CHEST - 2 VIEW COMPARISON:  October 28, 2019 FINDINGS: The heart size and mediastinal contours are within normal limits. Both lungs are clear. The visualized skeletal structures are unremarkable. IMPRESSION: No active cardiopulmonary disease. Electronically Signed   By: Dahlia Bailiff M.D.   On: 01/25/2021 23:58    ____________________________________________   PROCEDURES   Procedure(s)  performed (including Critical Care):  Procedures   ____________________________________________   INITIAL IMPRESSION / MDM / [place] / ED COURSE  As part of my medical decision making, I reviewed the following data within the [facility] notes reviewed and incorporated, Labs reviewed , EKG interpreted , Old chart reviewed, Radiograph reviewed , and Notes from prior ED visits   Differential diagnosis includes, but is not limited to, angina, ACS, pneumonia, PE, musculoskeletal strain, anxiety.  Patient has a low risk for ACS based on HEAR score.  PERC negative.  Vital signs stable and reassuring.  Symptoms minimal at this point, only including some numbness to his left little finger which is unclear whether it is related.  I personally reviewed the patient's imaging and agree with the radiologist's interpretation that there are no acute abnormalities on chest x-ray.  EKG shows no sign of ischemia.  High-sensitivity troponin within normal limits x2.  CBC and basic metabolic panel are all essentially normal as well.  I provided reassurance to the patient and encouraged outpatient follow-up either to establish PCP or to go to cardiology.  He understands and agrees with the plan.  I given strict return precautions.           ____________________________________________  FINAL CLINICAL IMPRESSION(S) / ED DIAGNOSES  Final diagnoses:  Chest pain, unspecified type     MEDICATIONS GIVEN DURING THIS VISIT:  Medications - No data to display   ED Discharge Orders     None        Norris:  This document was prepared using Dragon voice recognition software and may include unintentional dictation errors.   Hinda Kehr, MD 01/26/21 [phone]

## 2021-01-26 NOTE — ED Notes (Signed)
Pt updated on delay, pt verbalizes understanding.

## 2021-01-26 NOTE — Discharge Instructions (Addendum)

## 2021-03-18 ENCOUNTER — Other Ambulatory Visit: Payer: Self-pay

## 2021-03-18 ENCOUNTER — Ambulatory Visit
Admission: EM | Admit: 2021-03-18 | Discharge: 2021-03-18 | Disposition: A | Discharge status: Home / Self Care | Payer: Managed Care, Other (non HMO) | Attending: Family Medicine | Admitting: Family Medicine

## 2021-03-18 DIAGNOSIS — R21 Rash and other nonspecific skin eruption: Secondary | ICD-10-CM | POA: Insufficient documentation

## 2021-03-18 DIAGNOSIS — Z113 Encounter for screening for infections with a predominantly sexual mode of transmission: Secondary | ICD-10-CM | POA: Diagnosis present

## 2021-03-18 NOTE — Discharge Instructions (Signed)
We have sent testing for STD screening. We will notify you of any positive results once they are received. If required, we will prescribe any medications you might need.

## 2021-03-18 NOTE — ED Triage Notes (Signed)
Pt reports he has a bump in the right side or the penis x 5-6 months and 2 bumps in the left side of the penis x 2-3 days. Denies pain, itching.

## 2021-03-19 LAB — HIV ANTIBODY (ROUTINE TESTING W REFLEX): HIV Screen 4th Generation wRfx: NONREACTIVE

## 2021-03-19 LAB — RPR: RPR Ser Ql: NONREACTIVE

## 2021-03-20 LAB — CYTOLOGY, (ORAL, ANAL, URETHRAL) ANCILLARY ONLY
Chlamydia: NEGATIVE
Comment: NEGATIVE
Comment: NEGATIVE
Comment: NORMAL
Neisseria Gonorrhea: NEGATIVE
Trichomonas: NEGATIVE

## 2021-03-21 NOTE — ED Provider Notes (Signed)
[facility]   [id number] 03/18/21 Arrival Time: 2355  ASSESSMENT & PLAN:  1. Rash and nonspecific skin eruption   2. Screening for STDs (sexually transmitted diseases)    Benign penile bumps; reassured.    Discharge Instructions      We have sent testing for STD screening. We will notify you of any positive results once they are received. If required, we will prescribe any medications you might need.      Pending: Labs Reviewed  RPR   Narrative:    Performed at:  [facility] [address], [place], [place]  [id number] Lab Director: Rush Farmer MD, Phone:  [phone]  HIV ANTIBODY (ROUTINE TESTING W REFLEX)   Narrative:    Performed at:  [facility] [address] [place], [place]  [id number] Lab Director: Rush Farmer MD, Phone:  [phone]  CYTOLOGY, (ORAL, ANAL, URETHRAL) ANCILLARY ONLY    Will notify of any positive results. Instructed to refrain from sexual activity for at least seven days.  Reviewed expectations re: course of current medical issues. Questions answered. Outlined signs and symptoms indicating need for more acute intervention. Patient verbalized understanding. After Visit Summary given.   SUBJECTIVE:  Omar Norris is a 36 y.o. male who presents with complaint of long-standing "bumps" on his penis. No pain/ulcerations/bleeding. Desires STD testing. No penile discharge. No tx PTA.  OBJECTIVE:  Vitals:   03/18/21 1829  BP: (!) 143/86  Pulse: 89  Resp: 18  Temp: 98.6 F (37 C)  TempSrc: Oral  SpO2: 97%     General appearance: alert, cooperative, appears stated age and no distress Throat: lips, mucosa, and tongue normal; teeth and gums normal Lungs: unlabored respirations; speaks full sentences without difficulty Back: no CVA tenderness; FROM at waist Abdomen: soft, non-tender GU: normal appearing genitalia except for a few non-specific skin-colored 5mm solitary bumps on shaft of penis; no  signs of genital herpes Skin: warm and dry Psychological: alert and cooperative; normal mood and affect.    Labs Reviewed  RPR   Narrative:    Performed at:  [facility] [address], [place], [place]  [id number] Lab Director: Rush Farmer MD, Phone:  [phone]  HIV ANTIBODY (ROUTINE TESTING W REFLEX)   Narrative:    Performed at:  [facility] [address] [place], [place]  [id number] Lab Director: Rush Farmer MD, Phone:  [phone]  CYTOLOGY, (ORAL, ANAL, URETHRAL) ANCILLARY ONLY    No Known Allergies  Past Medical History:  Diagnosis Date   Beta thalassemia ([facility])    Family History  Problem Relation Age of Onset   Hypertension Mother    Hyperlipidemia Mother    Social History   Socioeconomic History   Marital status: Single    Spouse name: Not on file   Number of children: Not on file   Years of education: Not on file   Highest education level: Not on file  Occupational History   Not on file  Tobacco Use   Smoking status: Former    Types: Cigars   Smokeless tobacco: Never   Tobacco comments:    weekends  Vaping Use   Vaping Use: Former  Substance and Sexual Activity   Alcohol use: Yes    Alcohol/week: 2.0 standard drinks    Types: 2 Cans of beer per week   Drug use: No   Sexual activity: Yes    Birth control/protection: Condom  Other Topics Concern   Not on file  Social History Narrative   Not on file   Social Determinants of Health   Financial Resource Strain: Not on file  Food Insecurity: Not on file  Transportation Needs: Not on file  Physical Activity: Not on file  Stress: Not on file  Social Connections: Not on file  Intimate Partner Violence: Not on file           Vanessa Kick, MD 03/21/21 [phone]

## 2021-04-13 ENCOUNTER — Other Ambulatory Visit: Payer: Self-pay

## 2021-04-13 ENCOUNTER — Emergency Department (HOSPITAL_COMMUNITY)
Admission: EM | Admit: 2021-04-13 | Discharge: 2021-04-13 | Disposition: A | Discharge status: Home / Self Care | Payer: Managed Care, Other (non HMO) | Attending: Emergency Medicine | Admitting: Emergency Medicine

## 2021-04-13 ENCOUNTER — Encounter (HOSPITAL_COMMUNITY): Payer: Self-pay

## 2021-04-13 DIAGNOSIS — Z711 Person with feared health complaint in whom no diagnosis is made: Secondary | ICD-10-CM

## 2021-04-13 DIAGNOSIS — Z87891 Personal history of nicotine dependence: Secondary | ICD-10-CM | POA: Insufficient documentation

## 2021-04-13 DIAGNOSIS — Z202 Contact with and (suspected) exposure to infections with a predominantly sexual mode of transmission: Secondary | ICD-10-CM | POA: Diagnosis not present

## 2021-04-13 NOTE — ED Provider Notes (Signed)
[facility] Emergency Department Provider Note MRN:  [id number]  Arrival date & time: 04/13/21     Chief Complaint   Exposure to STD   History of Present Illness   Omar Norris is a 36 y.o. year-old male with no pertinent past medical history presenting to the ED with chief complaint of exposure to STD.  Patient was informed today that his sexual partner has been having sexual relations with multiple partners.  He explains that he is a clean person and is worried about exposure to STDs.  He is here for testing.  He denies any symptoms.  No penile discharge, no swelling, no pain.  Feels normal.  Review of Systems  A thorough review of systems was obtained and all systems are negative except as noted in the HPI and PMH.   Patient's Health History    Past Medical History:  Diagnosis Date   Beta thalassemia ([place])     History reviewed. No pertinent surgical history.  Family History  Problem Relation Age of Onset   Hypertension Mother    Hyperlipidemia Mother     Social History   Socioeconomic History   Marital status: Single    Spouse name: Not on file   Number of children: Not on file   Years of education: Not on file   Highest education level: Not on file  Occupational History   Not on file  Tobacco Use   Smoking status: Former    Types: Cigars   Smokeless tobacco: Never   Tobacco comments:    weekends  Vaping Use   Vaping Use: Former  Substance and Sexual Activity   Alcohol use: Yes    Alcohol/week: 2.0 standard drinks    Types: 2 Cans of beer per week   Drug use: No   Sexual activity: Yes    Birth control/protection: Condom  Other Topics Concern   Not on file  Social History Narrative   Not on file   Social Determinants of Health   Financial Resource Strain: Not on file  Food Insecurity: Not on file  Transportation Needs: Not on file  Physical Activity: Not on file  Stress: Not on file  Social Connections: Not on file   Intimate Partner Violence: Not on file     Physical Exam  There were no vitals filed for this visit.  CONSTITUTIONAL: Well-appearing, NAD NEURO/PSYCH:  Alert and oriented x 3, no focal deficits EYES:  eyes equal and reactive ENT/NECK:  no LAD, no JVD CARDIO: Regular rate, well-perfused PULM:  CTAB no wheezing or rhonchi GI/GU:  non-distended, non-tender MSK/SPINE:  No gross deformities, no edema SKIN:  no rash, atraumatic   *Additional and/or pertinent findings included in MDM below  Diagnostic and Interventional Summary    EKG Interpretation  Date/Time:    Ventricular Rate:    PR Interval:    QRS Duration:   QT Interval:    QTC Calculation:   R Axis:     Text Interpretation:         Labs Reviewed  GC/CHLAMYDIA PROBE AMP ([place]) NOT AT [facility]    No orders to display    Medications - No data to display   Procedures  /  Critical Care Procedures  ED Course and Medical Decision Making  Initial Impression and Ddx Asymptomatic, appropriate for urine testing and discharge.  Past medical/surgical history that increases complexity of ED encounter: None  Interpretation of Diagnostics Not applicable  Patient Reassessment and Ultimate Disposition/Management  Discharge home  Patient management required discussion with the following services or consulting groups:  None  Complexity of Problems Addressed Acute complicated illness or Injury  Additional Data Reviewed and Analyzed Further history obtained from: None  Additional Factors Impacting ED Encounter Risk None  Barth Kirks. [place]onia Small, [place] [email or web address]  Final Clinical Impressions(s) / ED Diagnoses     ICD-10-CM   1. Concern about STD in male without diagnosis  Z71.1       ED Discharge Orders     None        Discharge Instructions Discussed with and Provided to Patient:    Discharge Instructions      You were evaluated in the  Emergency Department and after careful evaluation, we did not find any emergent condition requiring admission or further testing in the hospital.  Your exam/testing today was overall reassuring.  We are sending your urine for STD testing.  Recommend follow-up at the health department or your primary care doctor for HIV testing in a few weeks.  You will be called with any abnormal results.  Please return to the Emergency Department if you experience any worsening of your condition.  Thank you for allowing [place] to be a part of your care.       Maudie Flakes, MD 04/13/21 [phone]

## 2021-04-13 NOTE — ED Triage Notes (Signed)
Pt arrived via POV from home requesting STD check d/t recent possible exposure

## 2021-04-13 NOTE — Discharge Instructions (Signed)
You were evaluated in the Emergency Department and after careful evaluation, we did not find any emergent condition requiring admission or further testing in the hospital.  Your exam/testing today was overall reassuring.  We are sending your urine for STD testing.  Recommend follow-up at the health department or your primary care doctor for HIV testing in a few weeks.  You will be called with any abnormal results.  Please return to the Emergency Department if you experience any worsening of your condition.  Thank you for allowing [place] to be a part of your care.

## 2021-04-14 LAB — GC/CHLAMYDIA PROBE AMP (~~LOC~~) NOT AT ARMC
Chlamydia: NEGATIVE
Comment: NEGATIVE
Comment: NORMAL
Neisseria Gonorrhea: NEGATIVE

## 2021-06-10 ENCOUNTER — Emergency Department (HOSPITAL_COMMUNITY)
Admission: EM | Admit: 2021-06-10 | Discharge: 2021-06-10 | Disposition: A | Discharge status: Home / Self Care | Payer: Managed Care, Other (non HMO) | Attending: Emergency Medicine | Admitting: Emergency Medicine

## 2021-06-10 ENCOUNTER — Other Ambulatory Visit: Payer: Self-pay

## 2021-06-10 ENCOUNTER — Ambulatory Visit: Payer: Self-pay

## 2021-06-10 ENCOUNTER — Encounter (HOSPITAL_COMMUNITY): Payer: Self-pay | Admitting: Emergency Medicine

## 2021-06-10 DIAGNOSIS — Y990 Civilian activity done for income or pay: Secondary | ICD-10-CM | POA: Diagnosis not present

## 2021-06-10 DIAGNOSIS — T148XXA Other injury of unspecified body region, initial encounter: Secondary | ICD-10-CM

## 2021-06-10 DIAGNOSIS — S39012A Strain of muscle, fascia and tendon of lower back, initial encounter: Secondary | ICD-10-CM | POA: Diagnosis not present

## 2021-06-10 DIAGNOSIS — X500XXA Overexertion from strenuous movement or load, initial encounter: Secondary | ICD-10-CM | POA: Diagnosis not present

## 2021-06-10 DIAGNOSIS — Z202 Contact with and (suspected) exposure to infections with a predominantly sexual mode of transmission: Secondary | ICD-10-CM

## 2021-06-10 DIAGNOSIS — S3992XA Unspecified injury of lower back, initial encounter: Secondary | ICD-10-CM | POA: Diagnosis present

## 2021-06-10 LAB — URINALYSIS, ROUTINE W REFLEX MICROSCOPIC
Bilirubin Urine: NEGATIVE
Glucose, UA: NEGATIVE mg/dL
Hgb urine dipstick: NEGATIVE
Ketones, ur: NEGATIVE mg/dL
Leukocytes,Ua: NEGATIVE
Nitrite: NEGATIVE
Protein, ur: NEGATIVE mg/dL
Specific Gravity, Urine: 1.021 (ref 1.005–1.030)
pH: 6 (ref 5.0–8.0)

## 2021-06-10 LAB — HIV ANTIBODY (ROUTINE TESTING W REFLEX): HIV Screen 4th Generation wRfx: NONREACTIVE

## 2021-06-10 MED ORDER — NAPROXEN 500 MG PO TABS: 500.0000 mg | ORAL_TABLET | Freq: Two times a day (BID) | ORAL | 0 refills | Status: DC

## 2021-06-10 MED ORDER — METHOCARBAMOL 500 MG PO TABS: 500.0000 mg | ORAL_TABLET | Freq: Three times a day (TID) | ORAL | 0 refills | Status: DC

## 2021-06-10 NOTE — Discharge Instructions (Addendum)
Alternate with ice and heat to the affected area.  Take the medication as directed.  Follow-up with your primary care doctor or the orthopedic doctor listed for recheck, return emergency department for any new or worsening symptoms. ?

## 2021-06-10 NOTE — ED Triage Notes (Signed)
Pt endorses left side testicle pain and lower back pain after work that included lifting. Denies any swelling.  ?

## 2021-06-11 LAB — RPR: RPR Ser Ql: NONREACTIVE

## 2021-06-11 NOTE — ED Provider Notes (Signed)
?[place] ?Provider Note ? ? ?CSN: [id number] ?Arrival date & time: 06/10/21  1403 ? ?  ? ?History ? ?Chief Complaint  ?Patient presents with  ? Groin Pain  ? ? ?Omar Norris is a 36 y.o. male. ? ? ?Groin Pain ?Pertinent negatives include no chest pain, no abdominal pain and no headaches.  ? ?  ? ? ?Omar Norris is a 36 y.o. male who presents to the Emergency Department complaining of left low back pain with pain radiating to his left groin.  Pain present for few days.  Worse with movement.  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 Groin pain radiates from left hip.  He denies pain of his left testicle, penis, or swelling of his scrotum.  He also denies urine or bowel issues, pain or weakness radiating into his left leg.   ?He is alos requesting evaluation of possible STI exposure.  Admits to unprotected intercourse.  He denies any penile discharge, urinary sx's.  Reports few tiny "white bumps" to the shaft of his penis.  Was seen at another facility and told was "hair bumps."  He denies any open lesions or pain.  No other rashes, fever or chills.  ? ? ? ? ?Home Medications ?Prior to Admission medications   ?Medication Sig Start Date End Date Taking? Authorizing Provider  ?methocarbamol (ROBAXIN) 500 MG tablet Take 1 tablet (500 mg total) by mouth 3 (three) times daily. 06/10/21  Yes Shakemia Madera, PA-C  ?naproxen (NAPROSYN) 500 MG tablet Take 1 tablet (500 mg total) by mouth 2 (two) times daily. Take with food 06/10/21  Yes Harvard Zeiss, PA-C  ?esomeprazole (NEXIUM) 20 MG capsule Take 1 capsule (20 mg total) by mouth daily at 12 noon. ?Patient not taking: Reported on 06/10/2021 10/26/19   Alycia Rossetti, MD  ?   ? ?Allergies    ?Patient has no known allergies.   ? ?Review of Systems   ?Review of Systems  ?Cardiovascular:  Negative for chest pain.  ?Gastrointestinal:  Negative for abdominal pain, nausea and vomiting.  ?Genitourinary:  Positive for genital sores. Negative for  decreased urine volume, dysuria, flank pain, penile discharge, penile pain, penile swelling, scrotal swelling and testicular pain.  ?     Left groin pain  ?Musculoskeletal:  Positive for back pain.  ?Skin:  Negative for color change and rash.  ?Neurological:  Negative for weakness, numbness and headaches.  ?All other systems reviewed and are negative. ? ?Physical Exam ?Updated Vital Signs ?BP 127/86 (BP Location: Left Arm)   Pulse 60   Temp 98.3 ?F (36.8 ?C) (Oral)   Resp 18   Ht '5\' 9"'$  (1.753 m)   Wt 95.3 kg   SpO2 98%   BMI 31.01 kg/m?  ?Physical Exam ?Vitals and nursing note reviewed. Exam conducted with a chaperone present (pin point white papules to the shaft of penis.  no edema or erythema.  no open lesions.).  ?Constitutional:   ?   Appearance: Normal appearance. He is not toxic-appearing.  ?Cardiovascular:  ?   Rate and Rhythm: Normal rate.  ?   Pulses: Normal pulses.  ?Pulmonary:  ?   Effort: Pulmonary effort is normal.  ?Abdominal:  ?   General: There is no distension.  ?   Palpations: Abdomen is soft.  ?   Tenderness: There is no abdominal tenderness. There is no right CVA tenderness or left CVA tenderness.  ?   Hernia: There is no hernia in the  left inguinal area.  ?Genitourinary: ?   Penis: No erythema or tenderness.   ?   Testes:     ?   Left: Mass, tenderness or swelling not present. Cremasteric reflex is present.   ?   Epididymis:  ?   Left: Not enlarged. No tenderness.  ?Musculoskeletal:     ?   General: Normal range of motion.  ?   Comments: Ttp of the left lumbar paraspinal muscles.  No midline tenderness.  Pain to left groin reproduced with hip flexion and external rotation.    ?Lymphadenopathy:  ?   Lower Body: No left inguinal adenopathy.  ?Neurological:  ?   Mental Status: He is alert.  ? ? ?ED Results / Procedures / Treatments   ?Labs ?(all labs ordered are listed, but only abnormal results are displayed) ?Labs Reviewed  ?URINALYSIS, ROUTINE W REFLEX MICROSCOPIC  ?RPR  ?HIV ANTIBODY  (ROUTINE TESTING W REFLEX)  ?GC/CHLAMYDIA PROBE AMP ([place]) NOT AT [facility]  ? ? ?EKG ?None ? ?Radiology ?No results found. ? ?Procedures ?Procedures  ? ? ?Medications Ordered in ED ?Medications - No data to display ? ?ED Course/ Medical Decision Making/ A&P ?  ?                        ?Medical Decision Making ?Amount and/or Complexity of Data Reviewed ?Labs: ordered. ? ?Risk ?Prescription drug management. ? ? ?Pt here with low back and left groin pain that began after heavy lifting.  No focal neuro deficits. Diff dx would include testicle torsion, STI, kidney stone and MSK ? ?Sx's felt to be MSK injury.  Clinically, I do not suspect sx's are related to STI ? ?Urine w/o evidence of infection, GC chlamydia, HIV and RPR pending. ? ? ? ? ? ? ? ?Final Clinical Impression(s) / ED Diagnoses ?Final diagnoses:  ?Muscle strain  ?Possible exposure to STD  ? ? ?Rx / DC Orders ?ED Discharge Orders   ? ?      Ordered  ?  naproxen (NAPROSYN) 500 MG tablet  2 times daily       ? 06/10/21 1646  ?  methocarbamol (ROBAXIN) 500 MG tablet  3 times daily       ? 06/10/21 1646  ? ?  ?  ? ?  ? ? ?  ?Kem Parkinson, PA-C ?06/11/21 2235 ? ?  ?Noemi Chapel, MD ?06/13/21 [phone] ? ?

## 2021-06-25 ENCOUNTER — Ambulatory Visit
Admission: EM | Admit: 2021-06-25 | Discharge: 2021-06-25 | Disposition: A | Discharge status: Home / Self Care | Payer: Managed Care, Other (non HMO) | Attending: Nurse Practitioner | Admitting: Nurse Practitioner

## 2021-06-25 DIAGNOSIS — N50812 Left testicular pain: Secondary | ICD-10-CM | POA: Insufficient documentation

## 2021-06-25 LAB — POCT URINALYSIS DIP (MANUAL ENTRY)
Bilirubin, UA: NEGATIVE
Blood, UA: NEGATIVE
Glucose, UA: NEGATIVE mg/dL
Ketones, POC UA: NEGATIVE mg/dL
Leukocytes, UA: NEGATIVE
Nitrite, UA: NEGATIVE
Protein Ur, POC: NEGATIVE mg/dL
Spec Grav, UA: 1.025 (ref 1.010–1.025)
Urobilinogen, UA: 0.2 E.U./dL
pH, UA: 7 (ref 5.0–8.0)

## 2021-06-25 MED ORDER — MELOXICAM 7.5 MG PO TABS: 7.5000 mg | ORAL_TABLET | Freq: Every day | ORAL | 0 refills | Status: DC

## 2021-06-25 MED ORDER — DOXYCYCLINE HYCLATE 100 MG PO CAPS: 100.0000 mg | ORAL_CAPSULE | Freq: Two times a day (BID) | ORAL | 0 refills | Status: AC

## 2021-06-25 NOTE — Discharge Instructions (Addendum)
Your urinalysis is negative today.  The cytology results will be available within the next 48 to 72 hours.  If there is any abnormal result, you will be contacted and provided treatment. ?I am starting you on an antibiotic for 2 weeks and meloxicam, which is also an anti-inflammatory to see if this helps your symptoms.  I have spoken with Dr. Felipa Eth urologist.  He advised that if your symptoms do not improve within the next 3 weeks, follow-up in his office.  You can contact his office at [phone] for an appointment. ?Follow-up as needed. ? ?

## 2021-06-25 NOTE — ED Provider Notes (Addendum)
[facility]    CSN: [id number] Arrival date & time: 06/25/21  1056      History   Chief Complaint Chief Complaint  Patient presents with   Testicle Pain    HPI Omar Norris is a 36 y.o. male.   Patient is a 36 year old male who presents with left testicular pain.  Patient's symptoms have been present since November/2022, but over the past 2 weeks, they have worsened.  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  He denies any previous injury or trauma at this time.  Patient was seen in the ED on 4/19, and was informed that symptoms appear to be musculoskeletal persist.  He states that he was given an antibiotic and ibuprofen for his symptoms.  Patient states he did not get the antibiotic, but took the ibuprofen which seem to help his pain.  He states that he is continuing to have pain at this time despite using the ibuprofen.  He states pain feels like it starts in his left testicle and goes into his lower back and down into his bilateral thighs.  He denies any urinary frequency, urgency, hematuria, dysuria, or penile discharge.  He states that he did get tested for HIV and syphilis in the ER, but they did not do a swab of his penis.  He denies any abdominal pain, fever, chills, nausea, vomiting, or diarrhea.   Testicle Pain   Past Medical History:  Diagnosis Date   Beta thalassemia [facility])     Patient Active Problem List   Diagnosis Date Noted   Obesity (BMI 30-39.9) 01/07/2020   Tobacco use 09/07/2019   Lipoma of forehead 09/19/2015   Sore throat 03/18/2012   Seasonal allergies 11/09/2011    History reviewed. No pertinent surgical history.     Home Medications    Prior to Admission medications   Medication Sig Start Date End Date Taking? Authorizing Provider  doxycycline (VIBRAMYCIN) 100 MG capsule Take 1 capsule (100 mg total) by mouth 2 (two) times daily for 14 days. 06/25/21 07/09/21  Yes Leath-Warren, Sadie Haber, NP  meloxicam (MOBIC) 7.5 MG tablet Take 1 tablet (7.5 mg total) by mouth daily. 06/25/21  Yes Leath-Warren, Sadie Haber, NP  esomeprazole (NEXIUM) 20 MG capsule Take 1 capsule (20 mg total) by mouth daily at 12 noon. Patient not taking: Reported on 06/10/2021 10/26/19   Salley Scarlet, MD  methocarbamol (ROBAXIN) 500 MG tablet Take 1 tablet (500 mg total) by mouth 3 (three) times daily. 06/10/21   Triplett, Tammy, PA-C  naproxen (NAPROSYN) 500 MG tablet Take 1 tablet (500 mg total) by mouth 2 (two) times daily. Take with food 06/10/21   Pauline Aus, PA-C    Family History Family History  Problem Relation Age of Onset   Hypertension Mother    Hyperlipidemia Mother     Social History Social History   Tobacco Use   Smoking status: Former    Types: Cigars   Smokeless tobacco: Never   Tobacco comments:    weekends  Vaping Use   Vaping Use: Former  Substance Use Topics   Alcohol use: Yes    Alcohol/week: 2.0 standard drinks    Types: 2 Cans of beer per week   Drug use: No     Allergies   Patient has no known allergies.   Review of Systems Review of Systems  Constitutional: Negative.   Respiratory: Negative.  Cardiovascular: Negative.   Gastrointestinal: Negative.   Genitourinary:  Positive for scrotal swelling and testicular pain. Negative for dysuria, flank pain, frequency, hematuria, penile discharge, penile pain and penile swelling.  Skin: Negative.   Psychiatric/Behavioral: Negative.      Physical Exam Triage Vital Signs ED Triage Vitals [06/25/21 1121]  Enc Vitals Group     BP 126/79     Pulse Rate 73     Resp 18     Temp 97.9 F (36.6 C)     Temp Source Oral     SpO2 96 %     Weight      Height      Head Circumference      Peak Flow      Pain Score 5     Pain Loc      Pain Edu?      Excl. in GC?    No data found.  Updated Vital Signs BP 126/79 (BP Location: Right Arm)   Pulse 73   Temp 97.9 F (36.6 C) (Oral)    Resp 18   SpO2 96%   Visual Acuity Right Eye Distance:   Left Eye Distance:   Bilateral Distance:    Right Eye Near:   Left Eye Near:    Bilateral Near:     Physical Exam Vitals and nursing note reviewed. Exam conducted with a chaperone present Heide Spark, CMA).  Constitutional:      General: He is not in acute distress.    Appearance: Normal appearance.  HENT:     Head: Normocephalic.  Cardiovascular:     Rate and Rhythm: Normal rate and regular rhythm.     Pulses: Normal pulses.     Heart sounds: Normal heart sounds.  Pulmonary:     Effort: Pulmonary effort is normal.     Breath sounds: Normal breath sounds.  Abdominal:     General: Bowel sounds are normal.     Palpations: Abdomen is soft.     Tenderness: There is no right CVA tenderness or left CVA tenderness.     Hernia: There is no hernia in the left inguinal area or right inguinal area.  Genitourinary:    Pubic Area: No rash.      Penis: Normal and circumcised. No erythema, discharge or lesions.      Testes:        Left: Tenderness present. Swelling not present.     Epididymis:     Left: Normal. Not inflamed or enlarged. No tenderness.  Neurological:     Mental Status: He is alert.     UC Treatments / Results  Labs (all labs ordered are listed, but only abnormal results are displayed) Labs Reviewed  POCT URINALYSIS DIP (MANUAL ENTRY)  CYTOLOGY, (ORAL, ANAL, URETHRAL) ANCILLARY ONLY    EKG   Radiology No results found.  Procedures Procedures (including critical care time)  Medications Ordered in UC Medications - No data to display  Initial Impression / Assessment and Plan / UC Course  I have reviewed the triage vital signs and the nursing notes.  Pertinent labs & imaging results that were available during my care of the patient were reviewed by me and considered in my medical decision making (see chart for details).  The patient is a 36 year old male who presents with testicular pain that has  been present for the past several months.  Over the past 2 weeks, the patient's symptoms have worsened.  He was seen in the ER on 06/10/2021 and  was advised that symptoms are more of the musculoskeletal origin.  Today he continues to have pain that originates from his left testicle that radiates into the lower back and down into his upper thighs.  On exam, he has no penile swelling, no testicular/scrotal pain or swelling.  Epididymis is normal.  No hernias were seen.No concern for testicular torsion given the chronicity of his symptoms. Patient reports some relief with taking ibuprofen, but this is no longer helping.  Consulted with Dr. Pete Glatter, urologist, and he advised to start patient on doxycycline for 14 days and prescribed meloxicam for 30 days.  Patient was advised that he will need to follow-up with Dr. Pete Glatter within the next 3 weeks for further evaluation.  Recommended that he follow-up even if symptoms improve improved at this time.  Patient was informed his urinalysis was negative, cytology was also performed today.  There is low suspicion for STI, but will obtain anyway.  Patient advised to follow-up for any worsening scrotal pain, urinary symptoms, fever, chills, or other concerns. Final Clinical Impressions(s) / UC Diagnoses   Final diagnoses:  Testicular pain, left     Discharge Instructions      Your urinalysis is negative today.  The cytology results will be available within the next 48 to 72 hours.  If there is any abnormal result, you will be contacted and provided treatment. I am starting you on an antibiotic for 2 weeks and meloxicam, which is also an anti-inflammatory to see if this helps your symptoms.  I have spoken with Dr. Pete Glatter urologist.  He advised that if your symptoms do not improve within the next 3 weeks, follow-up in his office.  You can contact his office at [phone] for an appointment. Follow-up as needed.      ED Prescriptions     Medication Sig  Dispense Auth. Provider   doxycycline (VIBRAMYCIN) 100 MG capsule Take 1 capsule (100 mg total) by mouth 2 (two) times daily for 14 days. 28 capsule Leath-Warren, Sadie Haber, NP   meloxicam (MOBIC) 7.5 MG tablet Take 1 tablet (7.5 mg total) by mouth daily. 30 tablet Leath-Warren, Sadie Haber, NP      PDMP not reviewed this encounter.   Abran Cantor, NP 06/25/21 1230    Leath-Warren, Sadie Haber, NP 06/25/21 1354

## 2021-06-25 NOTE — ED Triage Notes (Signed)
Seen at the ED on 4/19 for left testicle pain and tx w/meds. Pt reports med did not decrease sxs. He notes sxs are now bilateral and he feels tension in thighs. ?Pt reports when he is working he feels the pain in his low back and bilateral sides. No injuries. ?

## 2021-06-29 LAB — CYTOLOGY, (ORAL, ANAL, URETHRAL) ANCILLARY ONLY
Chlamydia: NEGATIVE
Comment: NEGATIVE
Comment: NEGATIVE
Comment: NORMAL
Neisseria Gonorrhea: NEGATIVE
Trichomonas: NEGATIVE

## 2021-08-04 ENCOUNTER — Ambulatory Visit
Admission: EM | Admit: 2021-08-04 | Discharge: 2021-08-04 | Disposition: A | Discharge status: Home / Self Care | Payer: Managed Care, Other (non HMO) | Attending: Nurse Practitioner | Admitting: Nurse Practitioner

## 2021-08-04 ENCOUNTER — Ambulatory Visit (INDEPENDENT_AMBULATORY_CARE_PROVIDER_SITE_OTHER): Payer: Managed Care, Other (non HMO)

## 2021-08-04 DIAGNOSIS — Z23 Encounter for immunization: Secondary | ICD-10-CM

## 2021-08-04 DIAGNOSIS — S67193A Crushing injury of left middle finger, initial encounter: Secondary | ICD-10-CM | POA: Diagnosis not present

## 2021-08-04 DIAGNOSIS — S61213A Laceration without foreign body of left middle finger without damage to nail, initial encounter: Secondary | ICD-10-CM

## 2021-08-04 MED ORDER — TETANUS-DIPHTH-ACELL PERTUSSIS 5-2.5-18.5 LF-MCG/0.5 IM SUSY
0.5000 mL | PREFILLED_SYRINGE | Freq: Once | INTRAMUSCULAR | Status: AC
  Administered 2021-08-04: 0.5 mL via INTRAMUSCULAR

## 2021-08-04 MED ORDER — AMOXICILLIN-POT CLAVULANATE 875-125 MG PO TABS: 1.0000 | ORAL_TABLET | Freq: Two times a day (BID) | ORAL | 0 refills | Status: DC

## 2021-08-04 NOTE — Discharge Instructions (Addendum)
X-rays are negative for fracture of the left middle finger. Take medication as prescribed. Keep the dressing in place for 24 hours.  After that time, you may remove the dressing and cleanse with warm water.  May apply over-the-counter Neosporin or bacitracin to the area.  Change the area dressing daily.  Keep the area covered when working, may remove the dressing when at home. Apply ice to the affected area to help with pain and swelling.  Apply for 20 minutes, remove for 1 hour, then repeat. May take ibuprofen or Tylenol for pain or general discomfort. Monitor the area for signs of worsening to include increased swelling, redness, foul-smelling drainage, or if you develop fever, chills, generalized fatigue.  Please be advised she may experience numbness and tingling in the tip of the finger, which is normal with the healing process.

## 2021-08-04 NOTE — ED Triage Notes (Signed)
Pt states that last night at work and had a man hole lid dropped on his left hand middle finger  Pt states that he can move his finger but it was bleeding heavy last night

## 2021-08-04 NOTE — ED Notes (Signed)
NP cleaned site. RN applied Bacitracin to site, nonadherent pad placed, finger splint on top of pad. Reinforced with coban. Pt tolerated well.  Site management reviewed. Pt verbalized understanding.

## 2021-08-04 NOTE — ED Provider Notes (Signed)
[facility]    CSN: [id number] Arrival date & time: 08/04/21  1221      History   Chief Complaint Chief Complaint  Patient presents with   Finger Injury    HPI Omar Norris is a 36 y.o. male.   The history is provided by the patient.    Patient presents for an injury to the left middle finger.  Injury occurred around 3 AM this morning.  He states that a manhole cover was dropped on his left middle finger.  He states at the time of the injury, he had moderate bleeding spot 2 cuts on his fingertip. He complains of swelling and pain to the finger.  Describes the pain as "throbbing".  He denies numbness, tingling, or radiation of pain.  He is able to move the finger, but has difficulty due to swelling.  Past Medical History:  Diagnosis Date   Beta thalassemia [facility])     Patient Active Problem List   Diagnosis Date Noted   Obesity (BMI 30-39.9) 01/07/2020   Tobacco use 09/07/2019   Lipoma of forehead 09/19/2015   Sore throat 03/18/2012   Seasonal allergies 11/09/2011    History reviewed. No pertinent surgical history.     Home Medications    Prior to Admission medications   Medication Sig Start Date End Date Taking? Authorizing Provider  amoxicillin-clavulanate (AUGMENTIN) 875-125 MG tablet Take 1 tablet by mouth every 12 (twelve) hours. 08/04/21  Yes Calise Dunckel-Warren, Alda Lea, NP  esomeprazole (NEXIUM) 20 MG capsule Take 1 capsule (20 mg total) by mouth daily at 12 noon. Patient not taking: Reported on 06/10/2021 10/26/19   Alycia Rossetti, MD  meloxicam (MOBIC) 7.5 MG tablet Take 1 tablet (7.5 mg total) by mouth daily. 06/25/21   Kristie Bracewell-Warren, Alda Lea, NP  methocarbamol (ROBAXIN) 500 MG tablet Take 1 tablet (500 mg total) by mouth 3 (three) times daily. 06/10/21   Triplett, Tammy, PA-C  naproxen (NAPROSYN) 500 MG tablet Take 1 tablet (500 mg total) by mouth 2 (two) times daily. Take with food 06/10/21   Kem Parkinson, PA-C    Family History Family  History  Problem Relation Age of Onset   Hypertension Mother    Hyperlipidemia Mother     Social History Social History   Tobacco Use   Smoking status: Former    Types: Cigars   Smokeless tobacco: Never   Tobacco comments:    weekends  Vaping Use   Vaping Use: Former  Substance Use Topics   Alcohol use: Yes    Alcohol/week: 2.0 standard drinks of alcohol    Types: 2 Cans of beer per week    Comment: Occas   Drug use: No     Allergies   Patient has no known allergies.   Review of Systems Review of Systems PER HPI  Physical Exam Triage Vital Signs ED Triage Vitals  Enc Vitals Group     BP 08/04/21 1228 133/82     Pulse Rate 08/04/21 1228 74     Resp 08/04/21 1228 20     Temp 08/04/21 1228 98.4 F (36.9 C)     Temp Source 08/04/21 1228 Oral     SpO2 08/04/21 1228 97 %     Weight --      Height --      Head Circumference --      Peak Flow --      Pain Score 08/04/21 1226 8     Pain Loc --  Pain Edu? --      Excl. in [place]? --    No data found.  Updated Vital Signs BP 133/82 (BP Location: Right Arm)   Pulse 74   Temp 98.4 F (36.9 C) (Oral)   Resp 20   SpO2 97%   Visual Acuity Right Eye Distance:   Left Eye Distance:   Bilateral Distance:    Right Eye Near:   Left Eye Near:    Bilateral Near:     Physical Exam Vitals and nursing note reviewed.  Constitutional:      General: He is not in acute distress.    Appearance: Normal appearance.  HENT:     Head: Normocephalic.  Eyes:     Extraocular Movements: Extraocular movements intact.     Pupils: Pupils are equal, round, and reactive to light.  Musculoskeletal:     Left wrist: Normal.     Left hand: Decreased range of motion. Normal capillary refill. Normal pulse.     Cervical back: Normal range of motion.     Comments: Swelling to the DIP and PIP joints of the left middle finger.  2 lacerations noted to the finger pad of the distal tip of the finger.  Bleeding is controlled at this time.   No ecchymosis, or obvious deformity noted.  Skin:    General: Skin is warm and dry.  Neurological:     Mental Status: He is alert and oriented to person, place, and time.  Psychiatric:        Mood and Affect: Mood normal.        Behavior: Behavior normal.      UC Treatments / Results  Labs (all labs ordered are listed, but only abnormal results are displayed) Labs Reviewed - No data to display  EKG   Radiology DG Finger Middle Left  Result Date: 08/04/2021 CLINICAL DATA:  Crush injury to middle finger EXAM: LEFT MIDDLE FINGER 2+V COMPARISON:  None Available. FINDINGS: Soft tissue swelling but no evidence of fracture or malalignment. Bones are intact and unremarkable. IMPRESSION: Soft tissue swelling without evidence of fracture. Electronically Signed   By: Jacqulynn Cadet M.D.   On: 08/04/2021 12:56    Procedures Laceration Repair  Date/Time: 08/04/2021 1:38 PM  Performed by: Tish Men, NP Authorized by: Tish Men, NP   Consent:    Consent obtained:  Verbal   Consent given by:  Patient   Risks discussed:  Infection, pain and need for additional repair   Alternatives discussed:  Observation Universal protocol:    Procedure explained and questions answered to patient or proxy's satisfaction: yes     Patient identity confirmed:  Verbally with patient and arm band Anesthesia:    Anesthesia method:  None Laceration details:    Location:  Finger   Finger location:  L long finger   Wound length (cm): C-shaped laceration to finger pad, measures approximately 1.3 cm; 2nd laceration to finger pad measuring approximately 1 cm.   Laceration depth: superficial lacerations. Exploration:    Limited defect created (wound extended): yes     Contaminated: no   Treatment:    Wound cleansed with: HIbiclens soap and NS.   Amount of cleaning:  Standard   Irrigation solution:  Tap water   Irrigation method:  Tap   Debridement:  None Skin repair:     Repair method:  Tissue adhesive Approximation:    Approximation:  Loose Repair type:    Repair type:  Simple Post-procedure details:  Dressing:  Antibiotic ointment, non-adherent dressing and splint for protection   Procedure completion:  Tolerated Comments:     Patient with 2 lacerations to the finger pad of the left long finger.  First laceration measures 1.3 cm and is C-shaped.  Second laceration approximately 1 cm with exposed tissue present.  Recommended sutures of the second laceration, patient declined and requested Dermabond.  Loose approximation after application of Dermabond.  Patient tolerated well.  (including critical care time)  Medications Ordered in UC Medications  Tdap (BOOSTRIX) injection 0.5 mL (0.5 mLs Intramuscular Given 08/04/21 1343)    Initial Impression / Assessment and Plan / UC Course  I have reviewed the triage vital signs and the nursing notes.  Pertinent labs & imaging results that were available during my care of the patient were reviewed by me and considered in my medical decision making (see chart for details).  Patient presents after crush injury to the left long finger.  Injury occurred around 3 AM today.  X-rays were negative for fracture. On exam, he has swelling and tenderness to the left long finger along with 2 lacerations to the finger pad.  Offered patient suturing, but he declined.  Attempted use of Dermabond, difficulty closing the wound with the Dermabond.  Advised patient that suturing would be more favorable.  Second laceration with exposed tissue present.  We will cover patient with Augmentin at this time.  Supportive care recommendations were provided to the patient.  Nonadherent dressing with finger splint applied.  Strict return precautions were provided to the patient.  Follow-up as needed. Final Clinical Impressions(s) / UC Diagnoses   Final diagnoses:  Crushing injury of left middle finger, initial encounter  Laceration of left middle  finger without foreign body without damage to nail, initial encounter     Discharge Instructions      X-rays are negative for fracture of the left middle finger. Take medication as prescribed. Keep the dressing in place for 24 hours.  After that time, you may remove the dressing and cleanse with warm water.  May apply over-the-counter Neosporin or bacitracin to the area.  Change the area dressing daily.  Keep the area covered when working, may remove the dressing when at home. Apply ice to the affected area to help with pain and swelling.  Apply for 20 minutes, remove for 1 hour, then repeat. May take ibuprofen or Tylenol for pain or general discomfort. Monitor the area for signs of worsening to include increased swelling, redness, foul-smelling drainage, or if you develop fever, chills, generalized fatigue.  Please be advised she may experience numbness and tingling in the tip of the finger, which is normal with the healing process.      ED Prescriptions     Medication Sig Dispense Auth. Provider   amoxicillin-clavulanate (AUGMENTIN) 875-125 MG tablet Take 1 tablet by mouth every 12 (twelve) hours. 14 tablet Jaqueline Uber-Warren, Alda Lea, NP      PDMP not reviewed this encounter.   Tish Men, NP 08/04/21 1409

## 2021-08-26 ENCOUNTER — Encounter: Payer: Self-pay | Admitting: Emergency Medicine

## 2021-08-26 ENCOUNTER — Ambulatory Visit
Admission: EM | Admit: 2021-08-26 | Discharge: 2021-08-26 | Disposition: A | Discharge status: Home / Self Care | Payer: Managed Care, Other (non HMO) | Attending: Family Medicine | Admitting: Family Medicine

## 2021-08-26 DIAGNOSIS — S67193D Crushing injury of left middle finger, subsequent encounter: Secondary | ICD-10-CM | POA: Diagnosis not present

## 2021-08-26 DIAGNOSIS — S6992XD Unspecified injury of left wrist, hand and finger(s), subsequent encounter: Secondary | ICD-10-CM

## 2021-08-26 MED ORDER — GABAPENTIN 300 MG PO CAPS: 300.0000 mg | ORAL_CAPSULE | Freq: Every evening | ORAL | 0 refills | Status: DC | PRN

## 2021-08-26 NOTE — ED Triage Notes (Signed)
Crush injury to left middle finger on June 13th.  States finger continues to swell and still has pain on the tip of his finger.

## 2021-08-30 NOTE — ED Provider Notes (Signed)
[facility]    CSN: [id number] Arrival date & time: 08/26/21  1904      History   Chief Complaint No chief complaint on file.   HPI Omar Norris is a 36 y.o. male.   Patient presenting today following up on a crush injury to the left middle finger for which he was seen at this [facility] 08/04/21. States the finger continues to be swollen and painful distally though the lacerations which were closed with dermabond have healed well. Denies numbness, decreased ROM, fever, chills, discoloration, drainage. X-rays from initial visit neg for bony abnormality of the finger. Has been trying elevation, ice with minimal relief.    Past Medical History:  Diagnosis Date   Beta thalassemia [facility])     Patient Active Problem List   Diagnosis Date Noted   Obesity (BMI 30-39.9) 01/07/2020   Tobacco use 09/07/2019   Lipoma of forehead 09/19/2015   Sore throat 03/18/2012   Seasonal allergies 11/09/2011    History reviewed. No pertinent surgical history.     Home Medications    Prior to Admission medications   Medication Sig Start Date End Date Taking? Authorizing Provider  gabapentin (NEURONTIN) 300 MG capsule Take 1 capsule (300 mg total) by mouth at bedtime as needed. May cause drowsiness 08/26/21  Yes Volney American, PA-C  amoxicillin-clavulanate (AUGMENTIN) 875-125 MG tablet Take 1 tablet by mouth every 12 (twelve) hours. 08/04/21   Leath-Warren, Alda Lea, NP  esomeprazole (NEXIUM) 20 MG capsule Take 1 capsule (20 mg total) by mouth daily at 12 noon. Patient not taking: Reported on 06/10/2021 10/26/19   Alycia Rossetti, MD  meloxicam (MOBIC) 7.5 MG tablet Take 1 tablet (7.5 mg total) by mouth daily. 06/25/21   Leath-Warren, Alda Lea, NP  methocarbamol (ROBAXIN) 500 MG tablet Take 1 tablet (500 mg total) by mouth 3 (three) times daily. 06/10/21   Triplett, Tammy, PA-C  naproxen (NAPROSYN) 500 MG tablet Take 1 tablet (500 mg total) by mouth 2 (two) times daily. Take with food  06/10/21   Kem Parkinson, PA-C    Family History Family History  Problem Relation Age of Onset   Hypertension Mother    Hyperlipidemia Mother     Social History Social History   Tobacco Use   Smoking status: Former    Types: Cigars   Smokeless tobacco: Never   Tobacco comments:    weekends  Vaping Use   Vaping Use: Former  Substance Use Topics   Alcohol use: Yes    Alcohol/week: 2.0 standard drinks of alcohol    Types: 2 Cans of beer per week    Comment: Occas   Drug use: No     Allergies   Patient has no known allergies.   Review of Systems Review of Systems PER HPI  Physical Exam Triage Vital Signs ED Triage Vitals  Enc Vitals Group     BP 08/26/21 1918 138/82     Pulse Rate 08/26/21 1918 81     Resp 08/26/21 1918 18     Temp 08/26/21 1918 98.8 F (37.1 C)     Temp Source 08/26/21 1918 Oral     SpO2 08/26/21 1918 95 %     Weight --      Height --      Head Circumference --      Peak Flow --      Pain Score 08/26/21 1919 6     Pain Loc --      Pain  Edu? --      Excl. in [place]? --    No data found.  Updated Vital Signs BP 138/82 (BP Location: Right Arm)   Pulse 81   Temp 98.8 F (37.1 C) (Oral)   Resp 18   SpO2 95%   Visual Acuity Right Eye Distance:   Left Eye Distance:   Bilateral Distance:    Right Eye Near:   Left Eye Near:    Bilateral Near:     Physical Exam Vitals and nursing note reviewed.  Constitutional:      Appearance: Normal appearance.  HENT:     Head: Atraumatic.  Eyes:     Extraocular Movements: Extraocular movements intact.     Conjunctiva/sclera: Conjunctivae normal.  Cardiovascular:     Rate and Rhythm: Normal rate and regular rhythm.  Pulmonary:     Effort: Pulmonary effort is normal.     Breath sounds: Normal breath sounds.  Musculoskeletal:        General: Swelling, tenderness and signs of injury present. No deformity. Normal range of motion.     Cervical back: Normal range of motion and neck supple.      Comments: Trace edema distal left middle finger, callus present. No erythema, fluctuance, discoloration, deformity  Skin:    General: Skin is warm and dry.  Neurological:     General: No focal deficit present.     Mental Status: He is oriented to person, place, and time.     Comments: Left hand neurovascularly intact  Psychiatric:        Mood and Affect: Mood normal.        Thought Content: Thought content normal.        Judgment: Judgment normal.      UC Treatments / Results  Labs (all labs ordered are listed, but only abnormal results are displayed) Labs Reviewed - No data to display  EKG   Radiology No results found.  Procedures Procedures (including critical care time)  Medications Ordered in UC Medications - No data to display  Initial Impression / Assessment and Plan / UC Course  I have reviewed the triage vital signs and the nursing notes.  Pertinent labs & imaging results that were available during my care of the patient were reviewed by me and considered in my medical decision making (see chart for details).     Finger appears to be healing well with no obvious evidence of infection, bony abnormality, functional decrease. Discussed OTC pain relievers, gabapentin for neuropathic pain assoc with injury, RICE, work note given for rest. Ortho f/u if not fully resolving.   Final Clinical Impressions(s) / UC Diagnoses   Final diagnoses:  Injury of finger of left hand, subsequent encounter   Discharge Instructions   None    ED Prescriptions     Medication Sig Dispense Auth. Provider   gabapentin (NEURONTIN) 300 MG capsule Take 1 capsule (300 mg total) by mouth at bedtime as needed. May cause drowsiness 10 capsule Volney American, [place]      PDMP not reviewed this encounter.   Volney American, [place] 08/30/21 2048

## 2021-09-04 ENCOUNTER — Other Ambulatory Visit: Payer: Self-pay

## 2021-09-04 ENCOUNTER — Emergency Department (HOSPITAL_COMMUNITY)
Admission: EM | Admit: 2021-09-04 | Discharge: 2021-09-04 | Disposition: A | Discharge status: Home / Self Care | Payer: Managed Care, Other (non HMO) | Attending: Emergency Medicine | Admitting: Emergency Medicine

## 2021-09-04 ENCOUNTER — Encounter (HOSPITAL_COMMUNITY): Payer: Self-pay | Admitting: Emergency Medicine

## 2021-09-04 DIAGNOSIS — M79602 Pain in left arm: Secondary | ICD-10-CM | POA: Insufficient documentation

## 2021-09-04 DIAGNOSIS — Y990 Civilian activity done for income or pay: Secondary | ICD-10-CM | POA: Diagnosis not present

## 2021-09-04 DIAGNOSIS — X500XXA Overexertion from strenuous movement or load, initial encounter: Secondary | ICD-10-CM | POA: Insufficient documentation

## 2021-09-04 DIAGNOSIS — T148XXA Other injury of unspecified body region, initial encounter: Secondary | ICD-10-CM

## 2021-09-04 MED ORDER — NAPROXEN 500 MG PO TABS: 500.0000 mg | ORAL_TABLET | Freq: Two times a day (BID) | ORAL | 0 refills | Status: DC

## 2021-09-04 NOTE — ED Provider Notes (Signed)
[facility] EMERGENCY DEPARTMENT Provider Note   CSN: [id number] Arrival date & time: 09/04/21  9509     History {Add pertinent medical, surgical, social history, OB history to HPI:1} Chief Complaint  Patient presents with   Muscle Pain    Omar Norris is a 36 y.o. male with chief complaint of left-sided intermittent discomfort since Tuesday.  Pain usually affects the left under armpit region and moves towards the left upper abdomen.  Usually worse in the mornings, is relieved with activity and ibuprofen.  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  Denies symptoms on the right side.  Symptoms are not worse after eating or with exertion.  No recent fevers, specific mechanism of injury, neck stiffness, headache, vision changes, dizziness, chest pain, shortness of breath.  Denies changes in urinary or bowel habits.  States he has felt this once before when he had a nerve impingement in his left shoulder, feels very similar.  No other complaints at this time.  The history is provided by the patient and medical records.  Muscle Pain      Home Medications Prior to Admission medications   Medication Sig Start Date End Date Taking? Authorizing Provider  gabapentin (NEURONTIN) 300 MG capsule Take 1 capsule (300 mg total) by mouth at bedtime as needed. May cause drowsiness 08/26/21  Yes Volney American, PA-C  amoxicillin-clavulanate (AUGMENTIN) 875-125 MG tablet Take 1 tablet by mouth every 12 (twelve) hours. Patient not taking: Reported on 09/04/2021 08/04/21   Leath-Warren, Alda Lea, NP  esomeprazole (NEXIUM) 20 MG capsule Take 1 capsule (20 mg total) by mouth daily at 12 noon. Patient not taking: Reported on 06/10/2021 10/26/19   Alycia Rossetti, MD  methocarbamol (ROBAXIN) 500 MG tablet Take 1 tablet (500 mg total) by mouth 3 (three) times daily. Patient not taking: Reported on 09/04/2021 06/10/21   Triplett, Tammy, PA-C   naproxen (NAPROSYN) 500 MG tablet Take 1 tablet (500 mg total) by mouth 2 (two) times daily. Take with food Patient not taking: Reported on 09/04/2021 05/17/69   Prince Rome, PA-C      Allergies    Patient has no known allergies.    Review of Systems   Review of Systems  Musculoskeletal:        Soreness    Physical Exam Updated Vital Signs BP (!) 141/85   Pulse 83   Temp 98.1 F (36.7 C) (Oral)   Resp 17   Ht '5\' 9"'$  (1.753 m)   Wt 93 kg   SpO2 100%   BMI 30.27 kg/m  Physical Exam Vitals and nursing note reviewed.  Constitutional:      General: He is not in acute distress.    Appearance: Normal appearance. He is well-developed. He is not ill-appearing, toxic-appearing or diaphoretic.  HENT:     Head: Normocephalic and atraumatic.     Mouth/Throat:     Mouth: Mucous membranes are moist.     Pharynx: Oropharynx is clear.  Eyes:     General: No scleral icterus.    Conjunctiva/sclera: Conjunctivae normal.  Neck:     Comments: Neck very supple on exam Cardiovascular:     Rate and Rhythm: Normal rate and regular rhythm.     Pulses: Normal pulses.     Heart sounds: Normal heart sounds. No murmur heard. Pulmonary:     Effort: Pulmonary effort is normal. No respiratory distress.     Breath sounds: Normal  breath sounds. No wheezing.  Chest:     Chest wall: No tenderness.  Abdominal:     General: There is no distension.     Palpations: Abdomen is soft.     Tenderness: There is no abdominal tenderness. There is no guarding.  Musculoskeletal:        General: No swelling.     Cervical back: Neck supple. No rigidity.     Comments: No significant TTP of the chest, abdomen, or upper extremities.  Upper extremities.  Neurovascularly intact.  Full active and passive range of motion.  Skin:    General: Skin is warm and dry.     Capillary Refill: Capillary refill takes less than 2 seconds.     Coloration: Skin is not jaundiced or pale.     Findings: No abrasion, abscess,  acne, bruising, burn, ecchymosis, erythema, laceration, lesion, petechiae, rash or wound.       Neurological:     Mental Status: He is alert and oriented to person, place, and time.     Sensory: No sensory deficit.     Motor: No weakness.     Coordination: Coordination normal.  Psychiatric:        Mood and Affect: Mood normal.     ED Results / Procedures / Treatments   Labs (all labs ordered are listed, but only abnormal results are displayed) Labs Reviewed - No data to display  EKG None  Radiology No results found.  Procedures Procedures  {Document cardiac monitor, telemetry assessment procedure when appropriate:1}  Medications Ordered in ED Medications - No data to display  ED Course/ Medical Decision Making/ A&P                           Medical Decision Making  36 y.o. male presents to the ED for concern of Muscle Pain   This involves an extensive number of treatment options, and is a complaint that carries with it a high risk of complications and morbidity.  The emergent differential diagnosis prior to evaluation includes, but is not limited to: ***  This is not an exhaustive differential.   Past Medical History / Co-morbidities / Social History: Hx of *** Social Determinants of Health include: No PCP, resources provided  Additional History:  Internal and external records from outside source obtained and reviewed including ED visits  Lab Tests: None  Imaging Studies: None  ED Course: Pt well-appearing on exam.  Without significant medical history or direct mechanism of injury.  Nonseptic, non-ill-appearing in NAD.  Sitting comfortably.  AAOx4.  Concerned of some nonreproducible left-sided tenderness of the axillary area running towards the left upper quadrant.  Pain waxes and wanes, worse in the mornings when he has time to think about it, and disappears when he is occupied and with activity.  Without shortness of breath or chest pain.  Low suspicion for  ACS.  Lungs CTAB, without recent upper respiratory symptoms, low suspicion for pneumonia or pneumothorax.  Without clinical indications of DVT or prior history of DVT.  Satting at 100% on room air, heart rate around 80 bpm.  PERC score 0.  Low suspicion of pulmonary embolism.  Abdomen soft, nontender.  Without changes in bowel or urinary habits.  Pain not worsened with exertion or food consumption.  Low probability of acute abdominal pathology based on history, presentation, and exam findings.  Low suspicion for fracture or dislocation, without any red flag symptoms or specific mechanism of injury,  and range of motion remains fully intact, therefore I believe imaging may be deferred at this time.  Without pain in the ED.  Pt advised to follow up with orthopedics/PCP if symptoms persist for further work-up.  I do not believe a brace or stabilization is appropriate at this time.  Conservative therapy recommended and discussed.  Patient in NAD and in good condition at time of discharge.  Disposition: After consideration the patient's encounter today, I do not feel today's workup suggests an emergent condition requiring admission or immediate intervention beyond what has been performed at this time.  Safe for discharge; instructed to return immediately for worsening symptoms, change in symptoms or any other concerns.  I have reviewed the patients home medicines and have made adjustments as needed.  Discussed course of treatment with the patient, whom demonstrated understanding.  Patient in agreement and has no further questions.    I discussed this case with my attending physician Dr. Langston Masker, who agreed with the proposed treatment course and cosigned this note including patient's presenting symptoms, physical exam, and planned diagnostics and interventions.  Attending physician stated agreement with plan or made changes to plan which were implemented.     This chart was dictated using voice recognition software.   Despite best efforts to proofread, errors can occur which can change the documentation meaning.   {Document critical care time when appropriate:1} {Document review of labs and clinical decision tools ie heart score, Chads2Vasc2 etc:1}  {Document your independent review of radiology images, and any outside records:1} {Document your discussion with family members, caretakers, and with consultants:1} {Document social determinants of health affecting pt's care:1} {Document your decision making why or why not admission, treatments were needed:1} Final Clinical Impression(s) / ED Diagnoses Final diagnoses:  Musculoskeletal strain    Rx / DC Orders ED Discharge Orders          Ordered    naproxen (NAPROSYN) 500 MG tablet  2 times daily        09/04/21 1052

## 2021-09-04 NOTE — Discharge Instructions (Addendum)
You have been provided the contact information for local orthopedic specialist.  Please follow-up with them within the next few days for reevaluation of possible repeat nerve impingement or muscle strain, and continued medical management.  I have also sent in anti-inflammatory to the pharmacy by the name of naproxen.  You may take 1 tablet every 12 hours as needed for pain relief.  Continue to stretch, stay hydrated, and rest.  Return to the ED for new or worsening symptoms as discussed.  If you do not have a primary care doctor see the list below.  RESOURCE GUIDE  Chronic Pain Problems: Contact [facility]  [phone] Patients need to be referred by their primary care doctor.  Insufficient Money for Medicine: Contact United Way:  call "211" or [place] [phone].  No Primary Care Doctor: Call [facility]  [phone] - can help you locate a primary care doctor that  accepts your insurance, provides certain services, etc. Physician Referral Service- [phone] Agencies that provide inexpensive medical care: [facility]  [facility]  [phone] [facility]  [phone] [facility]  [phone] [facility]  [phone] [facility]  [phone]  [place] Providers: [facility]- [address], [address]  [phone], Mon-Fri 9am-7pm, Sat 9am-1pm [place], [address], [address] [place]  [place]- [address]  [place], [address], [phone]  Only accepts [place] patients after they have their name  applied to their card  Self Pay (no insurance) in [facility]: Sickle Cell Patients: Dr Kevan Ny, [facility]  [place],  [facility]- [place]  [facility] [place]- [address], [facility]- see information above (Speak to [employer] if you do not have insurance)       -  [facility]- [place], [place],  [place], [place]  Dr Vista Lawman-  [address], [place], [place] [facility]- [address] [phone]       -  [facility] [place]- [address], [place], also [address], [phone]       -    [facility]- [address], [place], 1st & 3rd Saturday   every month, 10am-1pm  1) Find a Doctor and Pay Out of Pocket Although you won't have to find out who is covered by your insurance plan, it is a good idea to ask around and get recommendations. You will then need to call the office and see if the doctor you have chosen will accept you as a new patient and what types of options they offer for patients who are self-pay. Some doctors offer discounts or will set up payment plans for their patients who do not have insurance, but you will need  to ask so you aren't surprised when you get to your appointment.  2) Contact Your Local Health Department Not all health departments have doctors that can see patients for sick visits, but many do, so it is worth a call to see if yours does. If you don't know where your local health department is, you can check in your phone book. The CDC also has a tool to help you locate your state's health department, and many state websites also have listings of all of their local health departments.  3) Find a [place] [facility] If your illness is not likely to be very severe or complicated, you may want to try a walk in clinic. These are popping up all over the country in pharmacies,  drugstores, and shopping centers. They're usually staffed by nurse practitioners or physician assistants that have been trained to treat common illnesses and complaints. They're usually fairly quick and inexpensive. However, if you have serious medical issues or chronic medical problems, these are probably not your best option  [facility] [place], [facility], [address] [place], phone [phone] or [phone].  Monday - Friday, call for an appointment. [place], [facility], [address], [place], phone [phone] or [phone].  Monday - Friday, call for an appointment.  Abuse/Neglect: [place] [phone] [place] [phone] (After Hours)  Emergency Shelter:  [facility] [phone]  Maternity Homes: Room at the [place] [phone] [place] [phone]  MRSA Hotline #:   515 269 2461  [facility] Dept. [address]      [place] Phone:  [phone]                                   Phone:  [phone]                 Phone:  [phone]  [facility], [place] in [place], [address],                                  [place] [phone] or [phone] (After Hours)  [place]  Substance Abuse Resources: Alcohol and Drug Services  [facility] [phone] [facility] [place] [facility]inita Pester [phone] Residential & Outpatient Substance Abuse Program  [phone]  Psychological Services: [place]  [phone] [facility]  [phone] [facility] [place], [place] [address], [place], [place]: [phone] or [phone], [employer]  Dental Assistance  Patients with Medicaid: [place] [id number] Phone:  [phone]                                                  Phone:  [phone]  If unable to pay or uninsured, contact:  [facility] or [facility]. to become qualified for the adult dental clinic.  Patients with Medicaid: [facility] [phone] [address], [place] [address], [phone]  If unable to pay, or uninsured, contact [facility] [phone]) or [place] [phone] in [place], [place] in [facility]) to become qualified for the adult dental clinic  Other [place]- [place], [place], [place], [address], [place], [place], 2nd and 4th Thursday of the month at 6:30am.  10 clients each day by appointment, can sometimes see walk-in patients if someone does not show for an appointment. [facility]- [address] [place] Danker [place], [place], [address], [place], [place], [place], [address], [place] Department- [phone] [place] [facility] [phone]

## 2021-09-04 NOTE — ED Triage Notes (Signed)
Pt to the ED with complaints of muscle strain under his left arm that runs to his flank and around his back.

## 2021-09-18 ENCOUNTER — Other Ambulatory Visit: Payer: Self-pay

## 2021-09-18 ENCOUNTER — Emergency Department (HOSPITAL_COMMUNITY)
Admission: EM | Admit: 2021-09-18 | Discharge: 2021-09-18 | Disposition: A | Discharge status: Home / Self Care | Payer: Self-pay | Attending: Emergency Medicine | Admitting: Emergency Medicine

## 2021-09-18 ENCOUNTER — Encounter (HOSPITAL_COMMUNITY): Payer: Self-pay | Admitting: *Deleted

## 2021-09-18 DIAGNOSIS — R1013 Epigastric pain: Secondary | ICD-10-CM | POA: Diagnosis not present

## 2021-09-18 LAB — COMPREHENSIVE METABOLIC PANEL
ALT: 41 U/L (ref 0–44)
AST: 22 U/L (ref 15–41)
Albumin: 4.1 g/dL (ref 3.5–5.0)
Alkaline Phosphatase: 111 U/L (ref 38–126)
Anion gap: 5 (ref 5–15)
BUN: 11 mg/dL (ref 6–20)
CO2: 25 mmol/L (ref 22–32)
Calcium: 8.7 mg/dL — ABNORMAL LOW (ref 8.9–10.3)
Chloride: 105 mmol/L (ref 98–111)
Creatinine, Ser: 0.94 mg/dL (ref 0.61–1.24)
GFR, Estimated: >60 mL/min (ref >60)
Glucose, Bld: 87 mg/dL (ref 70–99)
Potassium: 3.6 mmol/L (ref 3.5–5.1)
Sodium: 135 mmol/L (ref 135–145)
Total Bilirubin: 0.8 mg/dL (ref 0.3–1.2)
Total Protein: 7.1 g/dL (ref 6.5–8.1)

## 2021-09-18 LAB — CBC WITH DIFFERENTIAL/PLATELET
Abs Immature Granulocytes: 0.02 10*3/uL (ref 0.00–0.07)
Basophils Absolute: 0 10*3/uL (ref 0.0–0.1)
Basophils Relative: 0 %
Eosinophils Absolute: 0.3 10*3/uL (ref 0.0–0.5)
Eosinophils Relative: 4 %
HCT: 40.4 % (ref 39.0–52.0)
Hemoglobin: 12.4 g/dL — ABNORMAL LOW (ref 13.0–17.0)
Immature Granulocytes: 0 %
Lymphocytes Relative: 29 %
Lymphs Abs: 2 10*3/uL (ref 0.7–4.0)
MCH: 19.3 pg — ABNORMAL LOW (ref 26.0–34.0)
MCHC: 30.7 g/dL (ref 30.0–36.0)
MCV: 63 fL — ABNORMAL LOW (ref 80.0–100.0)
Monocytes Absolute: 0.5 10*3/uL (ref 0.1–1.0)
Monocytes Relative: 7 %
Neutro Abs: 4 10*3/uL (ref 1.7–7.7)
Neutrophils Relative %: 60 %
Platelets: 258 10*3/uL (ref 150–400)
RBC: 6.41 MIL/uL — ABNORMAL HIGH (ref 4.22–5.81)
RDW: 17.6 % — ABNORMAL HIGH (ref 11.5–15.5)
WBC: 6.8 10*3/uL (ref 4.0–10.5)
nRBC: 0 % (ref 0.0–0.2)

## 2021-09-18 LAB — LIPASE, BLOOD: Lipase: 33 U/L (ref 11–51)

## 2021-09-18 MED ORDER — PANTOPRAZOLE SODIUM 40 MG PO TBEC: 40.0000 mg | DELAYED_RELEASE_TABLET | Freq: Every day | ORAL | 1 refills | Status: DC

## 2021-09-18 MED ORDER — ALUM & MAG HYDROXIDE-SIMETH 200-200-20 MG/5ML PO SUSP
30.0000 mL | Freq: Once | ORAL | Status: AC
  Administered 2021-09-18: 30 mL via ORAL
  Filled 2021-09-18: qty 30

## 2021-09-18 NOTE — ED Triage Notes (Signed)
Pt with abd pain generalized and left flank pain since last Sunday.  Seen for same and states the ibuprofen is not helping.  Denies any N/V/D.

## 2021-09-18 NOTE — Discharge Instructions (Addendum)
I would like for you to stop taking certain medications and drinks.  When you drink alcohol or take medicines like ibuprofen or Aleve or aspirin will cause your stomach and your pain to get much worse.  I think this is coming from the acid in your stomach.  Please take the following medications  I have prescribed a medication called Protonix which is a medication that is used to treat acid reflux.  These type of medications will help to reduce the amount of acid in your stomach and if taken regularly they will help reduce your risk of stomach ulcers and your symptoms are related to acid reflux.  It is important that you take this medication at the same time every day.  It is also important to know that you can take this safely with medicine such as Pepcid.  It is similar to medications called omeprazole which is over-the-counter so if you choose to use that instead if it is a lower cost that is okay.  I want you to follow-up with your family doctor for further evaluation if this medication is not helping but return to the emergency department for severe or worsening symptoms  Thank you for allowing [place] to treat you in the emergency department today.  After reviewing your examination and potential testing that was done it appears that you are safe to go home.  I would like for you to follow-up with your doctor within the next several days, have them obtain your results and follow-up with them to review all of these tests.  If you should develop severe or worsening symptoms return to the emergency department immediately

## 2021-09-18 NOTE — ED Provider Notes (Signed)
[facility] EMERGENCY DEPARTMENT Provider Note   CSN: [id number] Arrival date & time: 09/18/21  1333     History  Chief Complaint  Patient presents with   Abdominal Pain    Omar Norris is a 36 y.o. male.   Abdominal Pain   This patient is a 36 year old male who has a medical history significant for some acid reflux currently taking Nexium and gabapentin.  The patient has no significant chronic alcohol history other than the occasional mixed drink on the weekend.  He also denies a history of pancreatitis or cholecystitis.  He has never had abdominal surgery.  He does take the Nexium every day.  He reports over the last 2 weeks he has had some gradually progressive abdominal discomfort mostly in the upper abdomen radiating around the bilateral sides to the back, seems to get worse after certain meals such as eating [place] food the other day.  He denies any pain with laying down, he does not have any nausea or vomiting, he has not had changes in bowel habits or urination habits and denies any swelling or rashes.  There is no fevers or chills.  The pain is the same today as it has been the last few days, there is a gradual predilection for the pain getting worse over time but it is not severe.  He states it does get better temporarily when he uses the antacid medications  Home Medications Prior to Admission medications   Medication Sig Start Date End Date Taking? Authorizing Provider  pantoprazole (PROTONIX) 40 MG tablet Take 1 tablet (40 mg total) by mouth daily. 09/18/21 11/17/21 Yes Noemi Chapel, MD  gabapentin (NEURONTIN) 300 MG capsule Take 1 capsule (300 mg total) by mouth at bedtime as needed. May cause drowsiness 08/26/21   Volney American, PA-C      Allergies    Patient has no known allergies.    Review of Systems   Review of Systems  Gastrointestinal:  Positive for abdominal pain.  All other systems reviewed and are negative.   Physical Exam Updated Vital  Signs BP 124/70   Pulse 81   Temp 98.2 F (36.8 C) (Oral)   Resp 19   Ht 1.753 m ('5\' 9"'$ )   Wt 102.5 kg   SpO2 98%   BMI 33.37 kg/m  Physical Exam Vitals and nursing note reviewed.  Constitutional:      General: He is not in acute distress.    Appearance: He is well-developed.  HENT:     Head: Normocephalic and atraumatic.     Mouth/Throat:     Pharynx: No oropharyngeal exudate.  Eyes:     General: No scleral icterus.       Right eye: No discharge.        Left eye: No discharge.     Conjunctiva/sclera: Conjunctivae normal.     Pupils: Pupils are equal, round, and reactive to light.  Neck:     Thyroid: No thyromegaly.     Vascular: No JVD.  Cardiovascular:     Rate and Rhythm: Normal rate and regular rhythm.     Heart sounds: Normal heart sounds. No murmur heard.    No friction rub. No gallop.  Pulmonary:     Effort: Pulmonary effort is normal. No respiratory distress.     Breath sounds: Normal breath sounds. No wheezing or rales.  Abdominal:     General: Bowel sounds are normal. There is no distension.     Palpations: Abdomen is  soft. There is no mass.     Tenderness: There is abdominal tenderness in the epigastric area.  Musculoskeletal:        General: No tenderness. Normal range of motion.     Cervical back: Normal range of motion and neck supple.  Lymphadenopathy:     Cervical: No cervical adenopathy.  Skin:    General: Skin is warm and dry.     Findings: No erythema or rash.  Neurological:     Mental Status: He is alert.     Coordination: Coordination normal.  Psychiatric:        Behavior: Behavior normal.     ED Results / Procedures / Treatments   Labs (all labs ordered are listed, but only abnormal results are displayed) Labs Reviewed  CBC WITH DIFFERENTIAL/PLATELET - Abnormal; Notable for the following components:      Result Value   RBC 6.41 (*)    Hemoglobin 12.4 (*)    MCV 63.0 (*)    MCH 19.3 (*)    RDW 17.6 (*)    All other components  within normal limits  COMPREHENSIVE METABOLIC PANEL - Abnormal; Notable for the following components:   Calcium 8.7 (*)    All other components within normal limits  LIPASE, BLOOD    EKG None  Radiology No results found.  Procedures Procedures    Medications Ordered in ED Medications  alum & mag hydroxide-simeth (MAALOX/MYLANTA) 200-200-20 MG/5ML suspension 30 mL (30 mLs Oral Given 09/18/21 1419)    ED Course/ Medical Decision Making/ A&P                           Medical Decision Making Amount and/or Complexity of Data Reviewed Labs: ordered.  Risk OTC drugs. Prescription drug management.   This patient has had a partial work-up for epigastric discomfort with a clinical exam about 10 or 11 days ago.  The pain unfortunately has continued he is now 2 weeks out with the pain that seems to mimic a gastritis.  We will obtain a lipase metabolic panel and CBC to make sure there is no other acute findings, will avoid imaging if possible but if there are significant abnormalities may need to have a CT scan.  His vital signs are normal, he is not tachycardic febrile or hypotensive.  The patient is agreeable to the work-up.  This is not exertional and does not appear to be cardiac, it is more epigastric radiating around to the bilateral back  Labs: Patient's vital signs are unremarkable, CBC without leukocytosis, no significant anemia or thrombocytopenia, lipase is normal, metabolic panel is normal, the patient's vital signs are normal.  We will discontinue some of his other medications and start pantoprazole, the patient is agreeable, he understands he needs to stop anti-inflammatories as well as alcohol,  I have discussed with the patient at the bedside the results, and the meaning of these results.  They have expressed her understanding to the need for follow-up with primary care physician        Final Clinical Impression(s) / ED Diagnoses Final diagnoses:  Epigastric pain     Rx / DC Orders ED Discharge Orders          Ordered    pantoprazole (PROTONIX) 40 MG tablet  Daily        09/18/21 1509              Noemi Chapel, MD 09/18/21 1510

## 2021-10-26 ENCOUNTER — Emergency Department (HOSPITAL_COMMUNITY)
Admission: EM | Admit: 2021-10-26 | Discharge: 2021-10-26 | Disposition: A | Discharge status: Home / Self Care | Payer: Self-pay | Attending: Emergency Medicine | Admitting: Emergency Medicine

## 2021-10-26 ENCOUNTER — Encounter (HOSPITAL_COMMUNITY): Payer: Self-pay | Admitting: Emergency Medicine

## 2021-10-26 ENCOUNTER — Other Ambulatory Visit: Payer: Self-pay

## 2021-10-26 DIAGNOSIS — A630 Anogenital (venereal) warts: Secondary | ICD-10-CM | POA: Insufficient documentation

## 2021-10-26 DIAGNOSIS — Z20822 Contact with and (suspected) exposure to covid-19: Secondary | ICD-10-CM | POA: Insufficient documentation

## 2021-10-26 DIAGNOSIS — J029 Acute pharyngitis, unspecified: Secondary | ICD-10-CM | POA: Diagnosis not present

## 2021-10-26 LAB — GROUP A STREP BY PCR: Group A Strep by PCR: NOT DETECTED

## 2021-10-26 LAB — SARS CORONAVIRUS 2 BY RT PCR: SARS Coronavirus 2 by RT PCR: NEGATIVE

## 2021-10-26 MED ORDER — IMIQUIMOD 5 % EX CREA: TOPICAL_CREAM | CUTANEOUS | 0 refills | Status: DC

## 2021-10-26 NOTE — Discharge Instructions (Addendum)
Your strep and COVID test were negative today.  There also does not appear to be an infection of the tooth that you were concerned about, so I doubt that this is what is causing your sore throat.  It is most likely another viral infection causing your sore throat.  The bumps on your penis are consistent with HPV, or genital warts.  I have sent you in a cream they we will put on the warts 3 times a week for 16 weeks and they should resolve.  If not, follow-up with PCP.

## 2021-10-26 NOTE — ED Provider Notes (Signed)
[facility] EMERGENCY DEPARTMENT Provider Note   CSN: [id number] Arrival date & time: 10/26/21  1835     History  Chief Complaint  Patient presents with   Sore Throat    Omar Norris is a 36 y.o. male who presents to the ED for evaluation of sore throat and right ear pain that began earlier this morning.  Patient denies fevers, cough, congestion.  He does note that he has a "bad tooth" on his right lower side that he thinks may be the cause of the sore throat and ear pain.  He denies dental pain at this time.  Additionally, patient states that he has some bumps on his penis that have been present for about 8 months.  They are nonpainful, nonpruritic.  He does shave around the groin.  He states that he has been with a girl that he is "worried about".  He denies dysuria, hematuria, penile discharge or penile pain.  Sore Throat Pertinent negatives include no chest pain.       Home Medications Prior to Admission medications   Medication Sig Start Date End Date Taking? Authorizing Provider  imiquimod (ALDARA) 5 % cream Apply topically 3 (three) times a week. 10/26/21  Yes Kathe Becton R, PA-C  gabapentin (NEURONTIN) 300 MG capsule Take 1 capsule (300 mg total) by mouth at bedtime as needed. May cause drowsiness 08/26/21   Volney American, PA-C  pantoprazole (PROTONIX) 40 MG tablet Take 1 tablet (40 mg total) by mouth daily. 09/18/21 11/17/21  Noemi Chapel, MD      Allergies    Patient has no known allergies.    Review of Systems   Review of Systems  Constitutional:  Negative for fever.  HENT:  Positive for ear pain and sore throat.   Respiratory:  Negative for cough.   Cardiovascular:  Negative for chest pain.    Physical Exam Updated Vital Signs BP (!) 155/96 (BP Location: Right Arm)   Pulse 67   Temp 98.2 F (36.8 C) (Oral)   Resp 15   Ht '5\' 9"'$  (1.753 m)   Wt 93 kg   SpO2 99%   BMI 30.27 kg/m  Physical Exam Vitals and nursing note reviewed. Exam conducted with  a chaperone present.  Constitutional:      General: He is not in acute distress.    Appearance: He is not ill-appearing.  HENT:     Head: Atraumatic.     Ears:     Comments: Cerumen buildup on the bilateral ears.  Tympanic membrane of the right ear only partially visible although no evidence of erythema     Mouth/Throat:     Pharynx: Uvula midline. Posterior oropharyngeal erythema present.     Tonsils: No tonsillar exudate or tonsillar abscesses.      Comments: Overall poor dentition.  Erosion of the right lower molar without surrounding erythema, fluctuance, induration or signs of infection.  Nontender to palpation. Eyes:     Conjunctiva/sclera: Conjunctivae normal.  Cardiovascular:     Rate and Rhythm: Normal rate and regular rhythm.     Pulses: Normal pulses.     Heart sounds: No murmur heard. Pulmonary:     Effort: Pulmonary effort is normal. No respiratory distress.     Breath sounds: Normal breath sounds.  Abdominal:     General: Abdomen is flat. There is no distension.     Palpations: Abdomen is soft.     Tenderness: There is no abdominal tenderness.  Genitourinary:  Penis: Lesions present.      Comments: Papillomas to penis shaft Musculoskeletal:        General: Normal range of motion.     Cervical back: Normal range of motion.  Skin:    General: Skin is warm and dry.     Capillary Refill: Capillary refill takes less than 2 seconds.  Neurological:     General: No focal deficit present.     Mental Status: He is alert.  Psychiatric:        Mood and Affect: Mood normal.     ED Results / Procedures / Treatments   Labs (all labs ordered are listed, but only abnormal results are displayed) Labs Reviewed  GROUP A STREP BY PCR  SARS CORONAVIRUS 2 BY RT PCR  GC/CHLAMYDIA PROBE AMP ([place]) NOT AT [facility]    EKG None  Radiology No results found.  Procedures Procedures    Medications Ordered in ED Medications - No data to display  ED Course/ Medical  Decision Making/ A&P                           Medical Decision Making Risk Prescription drug management.   36 year old male presents ED for evaluation of sore throat, right ear pain lesions on his penis.  Vitals without significant abnormalities.  Differentials include COVID, strep, viral infection, STD, HPV, herpes.  On exam, there is some erythema of the posterior oropharynx without tonsillar swelling, exudate or evidence of abscess.  Right ear has cerumen partially obstructing the tympanic membrane, however what is visible is nonerythematous and nonbulging.  There are small papillomas to the penis shaft consistent with a genital wart infection.  I ordered and interpreted COVID and strep test which were both negative.  Sore throat is likely related to other viral etiology.  Advised lozenges, increase fluid intake, teas and symptomatic management.  I did send out a GC chlamydia to check for additional infection.  Patient was prescribed imiquimod cream 5% to place onto the penis 3 times a week for 16 weeks per up-to-date recommendations.  Patient expresses understanding and is amenable to plan.  He was discharged home in stable condition. Final Clinical Impression(s) / ED Diagnoses Final diagnoses:  Pharyngitis, unspecified etiology  Genital warts    Rx / DC Orders ED Discharge Orders          Ordered    imiquimod (ALDARA) 5 % cream  3 times weekly        10/26/21 2009              Rodena Piety 10/26/21 2048    Hayden Rasmussen, MD 10/27/21 1145

## 2021-10-26 NOTE — ED Triage Notes (Signed)
Pt to the ED with complaints of a sore throat and right ear pain that began this morning.

## 2021-10-26 NOTE — ED Notes (Signed)
ED Provider at bedside. 

## 2021-10-28 LAB — GC/CHLAMYDIA PROBE AMP (~~LOC~~) NOT AT ARMC
Chlamydia: NEGATIVE
Comment: NEGATIVE
Comment: NORMAL
Neisseria Gonorrhea: NEGATIVE

## 2021-12-11 ENCOUNTER — Emergency Department (HOSPITAL_COMMUNITY)
Admission: EM | Admit: 2021-12-11 | Discharge: 2021-12-11 | Disposition: A | Discharge status: Home / Self Care | Payer: BC Managed Care – PPO | Attending: Emergency Medicine | Admitting: Emergency Medicine

## 2021-12-11 ENCOUNTER — Encounter (HOSPITAL_COMMUNITY): Payer: Self-pay | Admitting: Emergency Medicine

## 2021-12-11 DIAGNOSIS — M545 Low back pain, unspecified: Secondary | ICD-10-CM

## 2021-12-11 DIAGNOSIS — M549 Dorsalgia, unspecified: Secondary | ICD-10-CM | POA: Diagnosis not present

## 2021-12-11 DIAGNOSIS — M5459 Other low back pain: Secondary | ICD-10-CM | POA: Diagnosis not present

## 2021-12-11 LAB — URINALYSIS, ROUTINE W REFLEX MICROSCOPIC
Bilirubin Urine: NEGATIVE
Glucose, UA: NEGATIVE mg/dL
Hgb urine dipstick: NEGATIVE
Ketones, ur: NEGATIVE mg/dL
Leukocytes,Ua: NEGATIVE
Nitrite: NEGATIVE
Protein, ur: NEGATIVE mg/dL
Specific Gravity, Urine: 1.018 (ref 1.005–1.030)
pH: 7 (ref 5.0–8.0)

## 2021-12-11 MED ORDER — NAPROXEN 500 MG PO TABS: 500.0000 mg | ORAL_TABLET | Freq: Two times a day (BID) | ORAL | 0 refills | Status: DC

## 2021-12-11 MED ORDER — KETOROLAC TROMETHAMINE 30 MG/ML IJ SOLN
30.0000 mg | Freq: Once | INTRAMUSCULAR | Status: AC
  Administered 2021-12-11: 30 mg via INTRAMUSCULAR
  Filled 2021-12-11: qty 1

## 2021-12-11 NOTE — Discharge Instructions (Signed)
You were seen today for back pain.  Your urinalysis is reassuring.  This is likely musculoskeletal in nature.  Trial naproxen to see if this helps with your discomfort.  You may apply heat or ice.

## 2021-12-11 NOTE — ED Provider Notes (Signed)
[facility] EMERGENCY DEPARTMENT Provider Note   CSN: [id number] Arrival date & time: 12/11/21  0240     History  Chief Complaint  Patient presents with   Flank Pain    Omar Norris is a 36 y.o. male.  HPI     This is a 36 year old male with left-sided back pain.  Patient reports onset of symptoms earlier this week.  He drives a truck for living.  He states the pain has been off and on.  At times positioning seems to make it different.  It does radiate at times around to the right side.  He noted some cloudiness to his urine but no dysuria or hematuria.  No history of kidney stones.  Denies nausea or vomiting.  No fevers.  No history of drug use or cancer.  He has not taken anything for his pain.  Denies weakness, numbness, tingling of the lower extremities.  Home Medications Prior to Admission medications   Medication Sig Start Date End Date Taking? Authorizing Provider  naproxen (NAPROSYN) 500 MG tablet Take 1 tablet (500 mg total) by mouth 2 (two) times daily. 12/11/21  Yes Abeeha Twist, Barbette Hair, MD  gabapentin (NEURONTIN) 300 MG capsule Take 1 capsule (300 mg total) by mouth at bedtime as needed. May cause drowsiness 08/26/21   Volney American, PA-C  imiquimod [facility]) 5 % cream Apply topically 3 (three) times a week. 10/26/21   Tonye Pearson, PA-C  pantoprazole (PROTONIX) 40 MG tablet Take 1 tablet (40 mg total) by mouth daily. 09/18/21 11/17/21  Noemi Chapel, MD      Allergies    Patient has no known allergies.    Review of Systems   Review of Systems  Musculoskeletal:  Positive for back pain.  Neurological:  Negative for weakness and numbness.  All other systems reviewed and are negative.   Physical Exam Updated Vital Signs BP (!) 145/98 (BP Location: Left Arm)   Pulse 76   Temp 97.8 F (36.6 C) (Oral)   Resp 18   Ht 1.753 m ('5\' 9"'$ )   Wt 93 kg   SpO2 99%   BMI 30.27 kg/m  Physical Exam Vitals and nursing note reviewed.  Constitutional:       Appearance: He is well-developed. He is obese. He is not ill-appearing.  HENT:     Head: Normocephalic and atraumatic.  Eyes:     Pupils: Pupils are equal, round, and reactive to light.  Cardiovascular:     Rate and Rhythm: Normal rate and regular rhythm.     Heart sounds: No murmur heard. Pulmonary:     Effort: Pulmonary effort is normal. No respiratory distress.  Abdominal:     Palpations: Abdomen is soft.     Tenderness: There is no abdominal tenderness.  Musculoskeletal:     Cervical back: Neck supple.     Comments: Tenderness palpation left lower paraspinous muscle region of the lower lumbar spine, no midline step-off or deformity noted, 5 out of 5 strength bilateral lower extremities  Lymphadenopathy:     Cervical: No cervical adenopathy.  Skin:    General: Skin is warm and dry.  Neurological:     Mental Status: He is alert and oriented to person, place, and time.  Psychiatric:        Mood and Affect: Mood normal.     ED Results / Procedures / Treatments   Labs (all labs ordered are listed, but only abnormal results are displayed) Labs Reviewed  URINALYSIS, ROUTINE  W REFLEX MICROSCOPIC    EKG None  Radiology No results found.  Procedures Procedures    Medications Ordered in ED Medications  ketorolac (TORADOL) 30 MG/ML injection 30 mg (30 mg Intramuscular Given 12/11/21 0326)    ED Course/ Medical Decision Making/ A&P                           Medical Decision Making Amount and/or Complexity of Data Reviewed Labs: ordered.  Risk Prescription drug management.   This patient presents to the ED for concern of back pain, this involves an extensive number of treatment options, and is a complaint that carries with it a high risk of complications and morbidity.  I considered the following differential and admission for this acute, potentially life threatening condition.  The differential diagnosis includes musculoskeletal etiology such as lumbosacral strain,  less likely sciatica, kidney stone  MDM:    This is a 36 year old male who presents with back pain.  He is nontoxic and vital signs are reassuring.  Reports some of the pain is related to certain positioning especially when he is driving.  He has no red flags and no neurologic symptoms.  Does report some cloudy urine but no dysuria or hematuria.  Urine obtained and shows no evidence of UTI or hematuria.  Have lower suspicion at this time for kidney stone.  Patient was given Toradol and had significant improvement.  Will treat for musculoskeletal etiology.  Low suspicion for fracture and do not feel imaging is indicated at this time.  No evidence of cauda equina.  (Labs, imaging, consults)  Labs: I Ordered, and personally interpreted labs.  The pertinent results include: Urinalysis  Imaging Studies ordered: I ordered imaging studies including none I independently visualized and interpreted imaging. I agree with the radiologist interpretation  Additional history obtained from chart review.  External records from outside source obtained and reviewed including prior evaluations  Cardiac Monitoring: The patient was maintained on a cardiac monitor.  I personally viewed and interpreted the cardiac monitored which showed an underlying rhythm of: Sinus rhythm  Reevaluation: After the interventions noted above, I reevaluated the patient and found that they have :improved  Social Determinants of Health: Lives independently  Disposition: Discharge  Co morbidities that complicate the patient evaluation  Past Medical History:  Diagnosis Date   Beta thalassemia ([place])      Medicines Meds ordered this encounter  Medications   ketorolac (TORADOL) 30 MG/ML injection 30 mg   naproxen (NAPROSYN) 500 MG tablet    Sig: Take 1 tablet (500 mg total) by mouth 2 (two) times daily.    Dispense:  30 tablet    Refill:  0    I have reviewed the patients home medicines and have made adjustments as  needed  Problem List / ED Course: Problem List Items Addressed This Visit   None Visit Diagnoses     Acute left-sided low back pain without sciatica    -  Primary   Relevant Medications   ketorolac (TORADOL) 30 MG/ML injection 30 mg (Completed)   naproxen (NAPROSYN) 500 MG tablet                   Final Clinical Impression(s) / ED Diagnoses Final diagnoses:  Acute left-sided low back pain without sciatica    Rx / DC Orders ED Discharge Orders          Ordered    naproxen (NAPROSYN) 500 MG tablet  2 times daily        12/11/21 0454              Merryl Hacker, MD 12/11/21 [phone]

## 2021-12-11 NOTE — ED Triage Notes (Signed)
Pt c/o left sided flank pain on and off for the past week. Pt states if he is sitting for a long period the pain radiates around to his back and across to the right side.

## 2021-12-25 ENCOUNTER — Ambulatory Visit
Admission: EM | Admit: 2021-12-25 | Discharge: 2021-12-25 | Disposition: A | Discharge status: Home / Self Care | Payer: BC Managed Care – PPO | Attending: Family Medicine | Admitting: Family Medicine

## 2021-12-25 ENCOUNTER — Encounter: Payer: Self-pay | Admitting: Emergency Medicine

## 2021-12-25 DIAGNOSIS — Z113 Encounter for screening for infections with a predominantly sexual mode of transmission: Secondary | ICD-10-CM | POA: Diagnosis not present

## 2021-12-25 NOTE — ED Triage Notes (Signed)
Girlfriend told him he needs to get checked for herpes.  Denies discharge and sores in genital area.

## 2021-12-25 NOTE — ED Provider Notes (Signed)
[facility]    CSN: [id number] Arrival date & time: 12/25/21  1275      History   Chief Complaint No chief complaint on file.   HPI Omar Norris is a 36 y.o. male.   Patient presenting today requesting screening for STDs.  States his partner recently tested positive for HSV and is requesting screening for this as well.  States partner has never had an active lesion and he has never had a lesion either.  Also denies penile discharge, rashes, lesions, dysuria or any other symptoms.    Past Medical History:  Diagnosis Date   Beta thalassemia [facility])     Patient Active Problem List   Diagnosis Date Noted   Obesity (BMI 30-39.9) 01/07/2020   Tobacco use 09/07/2019   Lipoma of forehead 09/19/2015   Sore throat 03/18/2012   Seasonal allergies 11/09/2011    History reviewed. No pertinent surgical history.     Home Medications    Prior to Admission medications   Medication Sig Start Date End Date Taking? Authorizing Provider  gabapentin (NEURONTIN) 300 MG capsule Take 1 capsule (300 mg total) by mouth at bedtime as needed. May cause drowsiness 08/26/21   Volney American, PA-C  imiquimod [facility]) 5 % cream Apply topically 3 (three) times a week. 10/26/21   Tonye Pearson, PA-C  naproxen (NAPROSYN) 500 MG tablet Take 1 tablet (500 mg total) by mouth 2 (two) times daily. 12/11/21   Horton, Barbette Hair, MD  pantoprazole (PROTONIX) 40 MG tablet Take 1 tablet (40 mg total) by mouth daily. 09/18/21 11/17/21  Noemi Chapel, MD    Family History Family History  Problem Relation Age of Onset   Hypertension Mother    Hyperlipidemia Mother     Social History Social History   Tobacco Use   Smoking status: Former    Types: Cigars   Smokeless tobacco: Never   Tobacco comments:    weekends  Vaping Use   Vaping Use: Former  Substance Use Topics   Alcohol use: Yes    Alcohol/week: 7.0 standard drinks of alcohol    Types: 2 Cans of beer, 5 Shots of liquor per  week    Comment: Occas   Drug use: No     Allergies   Patient has no known allergies.   Review of Systems Review of Systems Per HPI  Physical Exam Triage Vital Signs ED Triage Vitals  Enc Vitals Group     BP 12/25/21 1034 (!) 151/91     Pulse Rate 12/25/21 1034 85     Resp 12/25/21 1034 18     Temp 12/25/21 1034 98.1 F (36.7 C)     Temp Source 12/25/21 1034 Oral     SpO2 12/25/21 1034 98 %     Weight --      Height --      Head Circumference --      Peak Flow --      Pain Score 12/25/21 1036 0     Pain Loc --      Pain Edu? --      Excl. in [place]? --    No data found.  Updated Vital Signs BP (!) 151/91 (BP Location: Right Arm)   Pulse 85   Temp 98.1 F (36.7 C) (Oral)   Resp 18   SpO2 98%   Visual Acuity Right Eye Distance:   Left Eye Distance:   Bilateral Distance:    Right Eye Near:   Left  Eye Near:    Bilateral Near:     Physical Exam Vitals and nursing note reviewed.  Constitutional:      Appearance: Normal appearance.  HENT:     Head: Atraumatic.  Eyes:     Extraocular Movements: Extraocular movements intact.     Conjunctiva/sclera: Conjunctivae normal.  Cardiovascular:     Rate and Rhythm: Normal rate and regular rhythm.  Pulmonary:     Effort: Pulmonary effort is normal.     Breath sounds: Normal breath sounds.  Abdominal:     General: Bowel sounds are normal. There is no distension.     Palpations: Abdomen is soft.     Tenderness: There is no abdominal tenderness. There is no guarding.  Genitourinary:    Comments: GU exam deferred, self swab performed Musculoskeletal:        General: Normal range of motion.     Cervical back: Normal range of motion and neck supple.  Skin:    General: Skin is warm and dry.  Neurological:     General: No focal deficit present.     Mental Status: He is oriented to person, place, and time.  Psychiatric:        Mood and Affect: Mood normal.        Thought Content: Thought content normal.         Judgment: Judgment normal.      UC Treatments / Results  Labs (all labs ordered are listed, but only abnormal results are displayed) Labs Reviewed  HIV ANTIBODY (ROUTINE TESTING W REFLEX)  RPR  CYTOLOGY, (ORAL, ANAL, URETHRAL) ANCILLARY ONLY    EKG   Radiology No results found.  Procedures Procedures (including critical care time)  Medications Ordered in UC Medications - No data to display  Initial Impression / Assessment and Plan / UC Course  I have reviewed the triage vital signs and the nursing notes.  Pertinent labs & imaging results that were available during my care of the patient were reviewed by me and considered in my medical decision making (see chart for details).     Vitals and exam reassuring, discussed at length about HSV transmission and that we will be unable to screen for this today as he does not have an active lesion and to just watch for lesions.  Cytology swab and HIV and syphilis labs all pending.  Treat if needed.  Final Clinical Impressions(s) / UC Diagnoses   Final diagnoses:  Routine screening for STI (sexually transmitted infection)   Discharge Instructions   None    ED Prescriptions   None    PDMP not reviewed this encounter.   Volney American, [place] 12/25/21 1107

## 2021-12-26 LAB — HIV ANTIBODY (ROUTINE TESTING W REFLEX): HIV Screen 4th Generation wRfx: NONREACTIVE

## 2021-12-26 LAB — RPR: RPR Ser Ql: NONREACTIVE

## 2021-12-28 LAB — CYTOLOGY, (ORAL, ANAL, URETHRAL) ANCILLARY ONLY
Chlamydia: NEGATIVE
Comment: NEGATIVE
Comment: NEGATIVE
Comment: NORMAL
Neisseria Gonorrhea: NEGATIVE
Trichomonas: NEGATIVE

## 2022-01-12 ENCOUNTER — Emergency Department
Admission: EM | Admit: 2022-01-12 | Discharge: 2022-01-12 | Disposition: A | Discharge status: Home / Self Care | Payer: BC Managed Care – PPO | Attending: Student in an Organized Health Care Education/Training Program | Admitting: Student in an Organized Health Care Education/Training Program

## 2022-01-12 ENCOUNTER — Emergency Department: Payer: BC Managed Care – PPO

## 2022-01-12 ENCOUNTER — Encounter: Payer: Self-pay | Admitting: Emergency Medicine

## 2022-01-12 ENCOUNTER — Other Ambulatory Visit: Payer: Self-pay

## 2022-01-12 DIAGNOSIS — R509 Fever, unspecified: Secondary | ICD-10-CM | POA: Diagnosis not present

## 2022-01-12 DIAGNOSIS — R519 Headache, unspecified: Secondary | ICD-10-CM | POA: Diagnosis not present

## 2022-01-12 DIAGNOSIS — U071 COVID-19: Secondary | ICD-10-CM | POA: Insufficient documentation

## 2022-01-12 DIAGNOSIS — R059 Cough, unspecified: Secondary | ICD-10-CM | POA: Diagnosis not present

## 2022-01-12 LAB — CBC WITH DIFFERENTIAL/PLATELET
Abs Immature Granulocytes: 0.04 10*3/uL (ref 0.00–0.07)
Basophils Absolute: 0 10*3/uL (ref 0.0–0.1)
Basophils Relative: 0 %
Eosinophils Absolute: 0.2 10*3/uL (ref 0.0–0.5)
Eosinophils Relative: 2 %
HCT: 38.9 % — ABNORMAL LOW (ref 39.0–52.0)
Hemoglobin: 12.1 g/dL — ABNORMAL LOW (ref 13.0–17.0)
Immature Granulocytes: 1 %
Lymphocytes Relative: 6 %
Lymphs Abs: 0.5 10*3/uL — ABNORMAL LOW (ref 0.7–4.0)
MCH: 19.1 pg — ABNORMAL LOW (ref 26.0–34.0)
MCHC: 31.1 g/dL (ref 30.0–36.0)
MCV: 61.6 fL — ABNORMAL LOW (ref 80.0–100.0)
Monocytes Absolute: 0.5 10*3/uL (ref 0.1–1.0)
Monocytes Relative: 7 %
Neutro Abs: 7.1 10*3/uL (ref 1.7–7.7)
Neutrophils Relative %: 84 %
Platelets: 241 10*3/uL (ref 150–400)
RBC: 6.32 MIL/uL — ABNORMAL HIGH (ref 4.22–5.81)
RDW: 17.2 % — ABNORMAL HIGH (ref 11.5–15.5)
WBC: 8.4 10*3/uL (ref 4.0–10.5)
nRBC: 0.2 % (ref 0.0–0.2)

## 2022-01-12 LAB — COMPREHENSIVE METABOLIC PANEL
ALT: 35 U/L (ref 0–44)
AST: 25 U/L (ref 15–41)
Albumin: 4.1 g/dL (ref 3.5–5.0)
Alkaline Phosphatase: 107 U/L (ref 38–126)
Anion gap: 7 (ref 5–15)
BUN: 12 mg/dL (ref 6–20)
CO2: 25 mmol/L (ref 22–32)
Calcium: 8.6 mg/dL — ABNORMAL LOW (ref 8.9–10.3)
Chloride: 106 mmol/L (ref 98–111)
Creatinine, Ser: 1.19 mg/dL (ref 0.61–1.24)
GFR, Estimated: >60 mL/min (ref >60)
Glucose, Bld: 99 mg/dL (ref 70–99)
Potassium: 3.7 mmol/L (ref 3.5–5.1)
Sodium: 138 mmol/L (ref 135–145)
Total Bilirubin: 1 mg/dL (ref 0.3–1.2)
Total Protein: 7 g/dL (ref 6.5–8.1)

## 2022-01-12 LAB — URINALYSIS, ROUTINE W REFLEX MICROSCOPIC
Bilirubin Urine: NEGATIVE
Glucose, UA: NEGATIVE mg/dL
Hgb urine dipstick: NEGATIVE
Ketones, ur: NEGATIVE mg/dL
Leukocytes,Ua: NEGATIVE
Nitrite: NEGATIVE
Protein, ur: NEGATIVE mg/dL
Specific Gravity, Urine: 1.024 (ref 1.005–1.030)
pH: 5 (ref 5.0–8.0)

## 2022-01-12 LAB — SARS CORONAVIRUS 2 BY RT PCR: SARS Coronavirus 2 by RT PCR: POSITIVE — AB

## 2022-01-12 MED ORDER — ACETAMINOPHEN 500 MG PO TABS
1000.0000 mg | ORAL_TABLET | Freq: Once | ORAL | Status: AC
  Administered 2022-01-12: 1000 mg via ORAL
  Filled 2022-01-12: qty 2

## 2022-01-12 MED ORDER — IBUPROFEN 600 MG PO TABS
600.0000 mg | ORAL_TABLET | Freq: Once | ORAL | Status: AC
  Administered 2022-01-12: 600 mg via ORAL
  Filled 2022-01-12: qty 1

## 2022-01-12 NOTE — ED Triage Notes (Signed)
Pt sts that he has been having a frontal headache for the last week.

## 2022-01-12 NOTE — ED Provider Notes (Signed)
[facility] Provider Note    Event Date/Time   First MD Initiated Contact with Patient 01/12/22 1747     (approximate)   History   Headache   HPI  Omar Norris is a 36 y.o. male no significant past medical history who is vaccinated for COVID presents to the ER for evaluation of 4 to 5 days of congestion headaches intermittent temperature occasional cough.  Had some nausea earlier today denies any abdominal pain no dysuria no flank pain.     Physical Exam   Triage Vital Signs: ED Triage Vitals  Enc Vitals Group     BP 01/12/22 1731 (!) 140/92     Pulse Rate 01/12/22 1731 (!) 110     Resp 01/12/22 1731 17     Temp 01/12/22 1731 (!) 100.8 F (38.2 C)     Temp Source 01/12/22 1731 Oral     SpO2 01/12/22 1731 99 %     Weight 01/12/22 1730 220 lb (99.8 kg)     Height 01/12/22 1812 '5\' 9"'$  (1.753 m)     Head Circumference --      Peak Flow --      Pain Score 01/12/22 1730 8     Pain Loc --      Pain Edu? --      Excl. in [place]? --     Most recent vital signs: Vitals:   01/12/22 1731  BP: (!) 140/92  Pulse: (!) 110  Resp: 17  Temp: (!) 100.8 F (38.2 C)  SpO2: 99%     Constitutional: Alert  Eyes: Conjunctivae are normal.  Head: Atraumatic. Nose: No congestion/rhinnorhea. Mouth/Throat: Mucous membranes are moist.   Neck: Painless ROM.  Cardiovascular:   Good peripheral circulation. Respiratory: Normal respiratory effort.  No retractions.  Gastrointestinal: Soft and nontender.  Musculoskeletal:  no deformity Neurologic:  MAE spontaneously. No gross focal neurologic deficits are appreciated.  Skin:  Skin is warm, dry and intact. No rash noted. Psychiatric: Mood and affect are normal. Speech and behavior are normal.    ED Results / Procedures / Treatments   Labs (all labs ordered are listed, but only abnormal results are displayed) Labs Reviewed  SARS CORONAVIRUS 2 BY RT PCR - Abnormal; Notable for the following components:       Result Value   SARS Coronavirus 2 by RT PCR POSITIVE (*)    All other components within normal limits  CBC WITH DIFFERENTIAL/PLATELET - Abnormal; Notable for the following components:   RBC 6.32 (*)    Hemoglobin 12.1 (*)    HCT 38.9 (*)    MCV 61.6 (*)    MCH 19.1 (*)    RDW 17.2 (*)    Lymphs Abs 0.5 (*)    All other components within normal limits  COMPREHENSIVE METABOLIC PANEL - Abnormal; Notable for the following components:   Calcium 8.6 (*)    All other components within normal limits  URINALYSIS, ROUTINE W REFLEX MICROSCOPIC - Abnormal; Notable for the following components:   Color, Urine YELLOW (*)    APPearance CLEAR (*)    All other components within normal limits  RESP PANEL BY RT-PCR (FLU A&B, COVID) [id number]     EKG     RADIOLOGY Please see ED Course for my review and interpretation.  I personally reviewed all radiographic images ordered to evaluate for the above acute complaints and reviewed radiology reports and findings.  These findings were personally discussed with the patient.  Please  see medical record for radiology report.    PROCEDURES:  Critical Care performed: No  Procedures   MEDICATIONS ORDERED IN ED: Medications  ibuprofen (ADVIL) tablet 600 mg (600 mg Oral Given 01/12/22 1825)     IMPRESSION / MDM / ASSESSMENT AND PLAN / ED COURSE  I reviewed the triage vital signs and the nursing notes.                              Differential diagnosis includes, but is not limited to, COVID, flu, URI, dehydration, electrolyte abnormality  Patient presenting to the ER for evaluation of symptoms as described above.  Based on symptoms, risk factors and considered above differential, this presenting complaint could reflect a potentially life-threatening illness therefore the patient will be placed on continuous pulse oximetry and telemetry for monitoring.  Laboratory evaluation will be sent to evaluate for the above complaints.  Patient is febrile  mildly tachycardic but well-appearing in no acute distress.  Have high suspicion for viral illness based on his presentation do not feel that neuro imaging clinically indicated.   Clinical Course as of 01/12/22 1950  Tue Jan 12, 2022  1813 Chest x-ray on my review and interpretation does not show evidence of consolidation or infiltrate. [PR]  1843 Patient without leukocytosis. [PR]  1913 Patient with evidence of COVID PCR.  Presentation is consistent with COVID illness.  He is otherwise hemodynamically stable and does appear stable and appropriate for outpatient follow-up.  Discussed and offered treatment Paxlovid but given duration of symptoms the true benefit that would provide this patient is uncertain therefore patient declining Paxlovid at this time. [PR]    Clinical Course User Index [PR] Merlyn Lot, MD    FINAL CLINICAL IMPRESSION(S) / ED DIAGNOSES   Final diagnoses:  [id number]     Rx / DC Orders   ED Discharge Orders     None        Note:  This document was prepared using Dragon voice recognition software and may include unintentional dictation errors.    Merlyn Lot, MD 01/12/22 1950

## 2022-01-13 LAB — RESP PANEL BY RT-PCR (FLU A&B, COVID) ARPGX2
Influenza A by PCR: NEGATIVE
Influenza B by PCR: NEGATIVE
SARS Coronavirus 2 by RT PCR: POSITIVE — AB

## 2022-03-03 IMAGING — CT CT RENAL STONE PROTOCOL
2 of 4 series · 17 of 46 positions shown, 19 images · non-contrast
Comparison: None.

CLINICAL DATA: Flank pain. Kidney stones suspected. Pain in LOWER
abdomen radiates to his back.

EXAM:
CT ABDOMEN AND PELVIS WITHOUT CONTRAST
TECHNIQUE: Multidetector CT imaging of the abdomen and pelvis was performed
following the standard protocol without IV contrast.

[Series 2: stone full standard · axial · 0.88mm/px · z∈[-458,-48]mm · 14 of 90 slices shown, 16 images]
[im 4/90  soft-tissue]
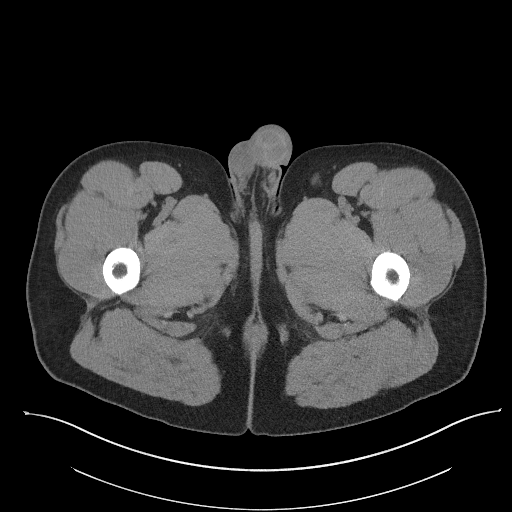
[im 4/90  bone]
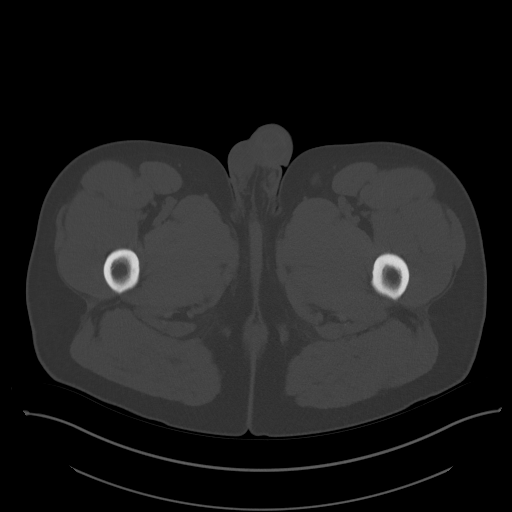
[im 11/90  soft-tissue]
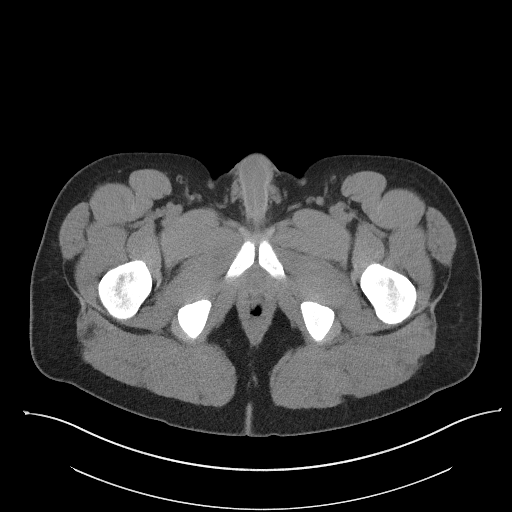
[im 18/90  soft-tissue]
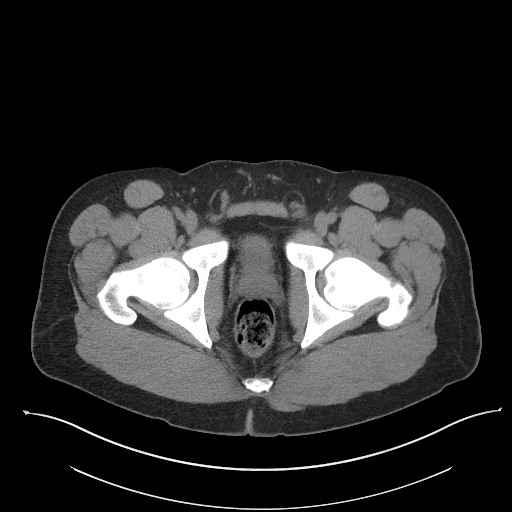
[im 25/90  soft-tissue]
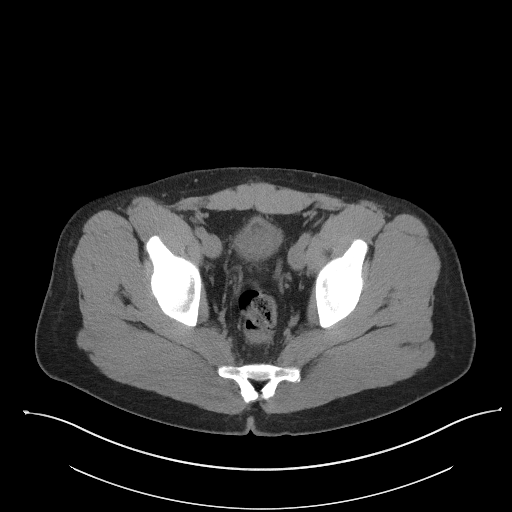
[im 29/90  soft-tissue]
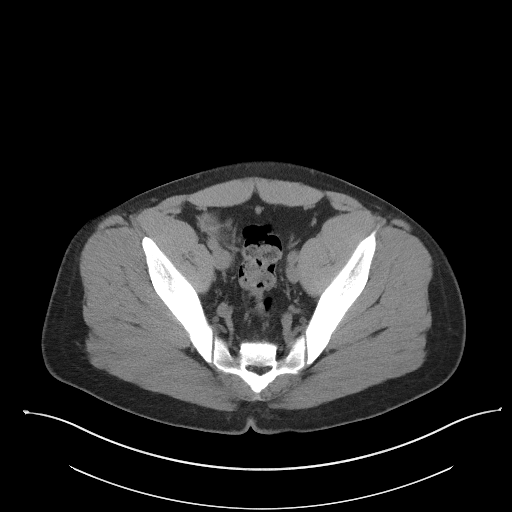
[im 36/90  soft-tissue]
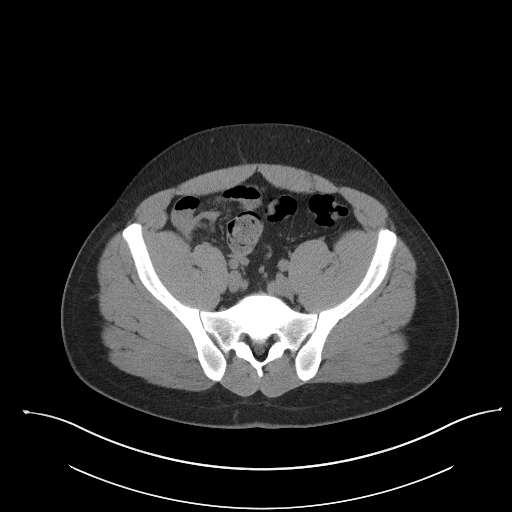
[im 43/90  soft-tissue]
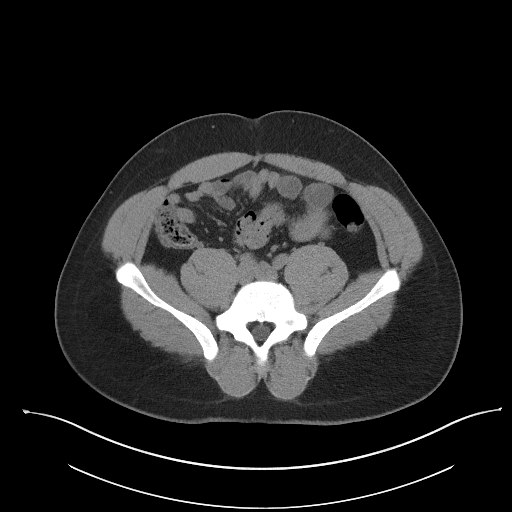
[im 47/90  soft-tissue]
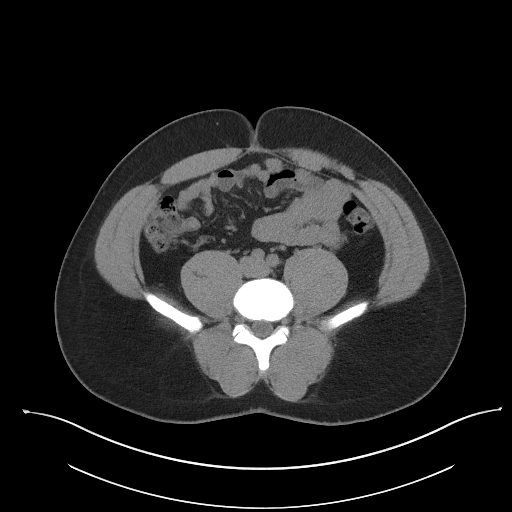
[im 54/90  soft-tissue]
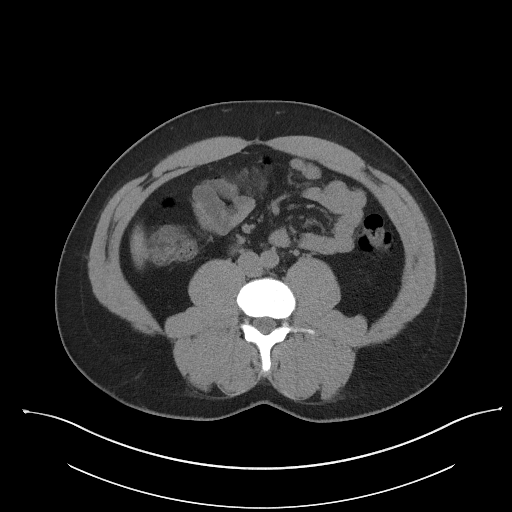
[im 54/90  bone]
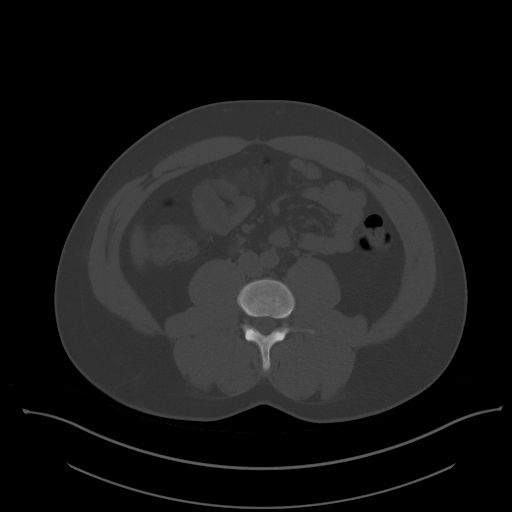
[im 61/90  soft-tissue]
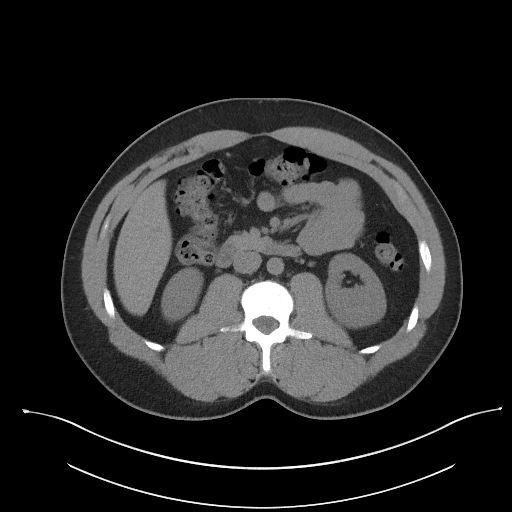
[im 68/90  soft-tissue]
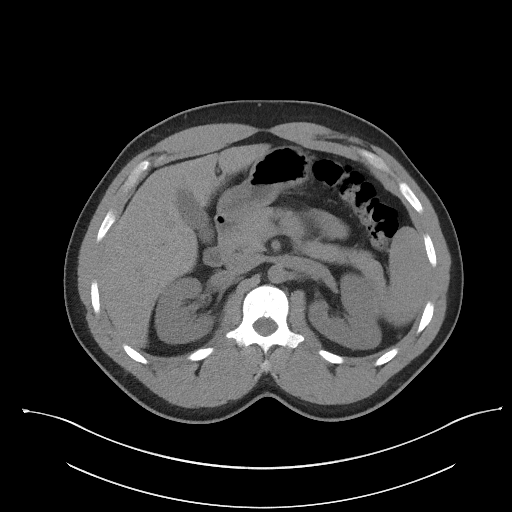
[im 72/90  soft-tissue]
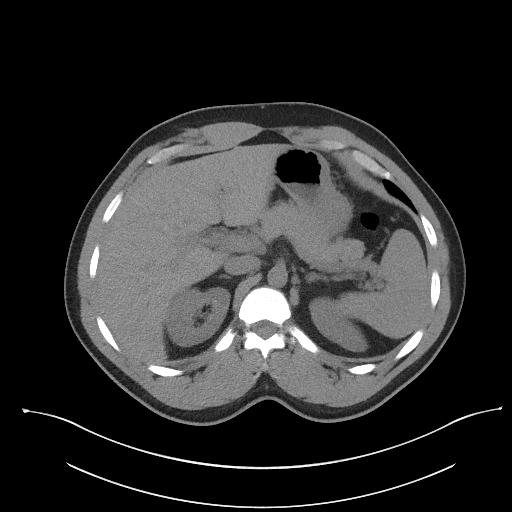
[im 79/90  soft-tissue]
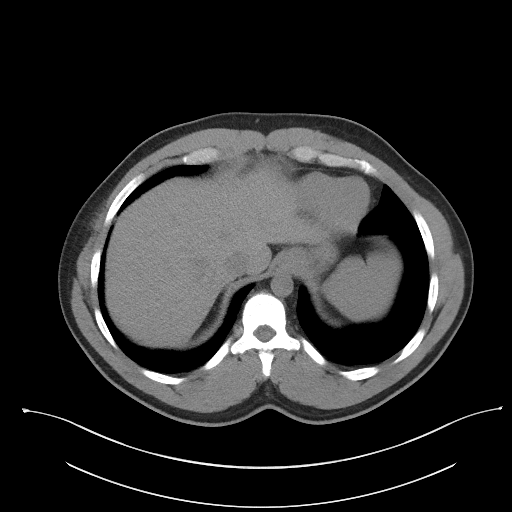
[im 86/90  soft-tissue]
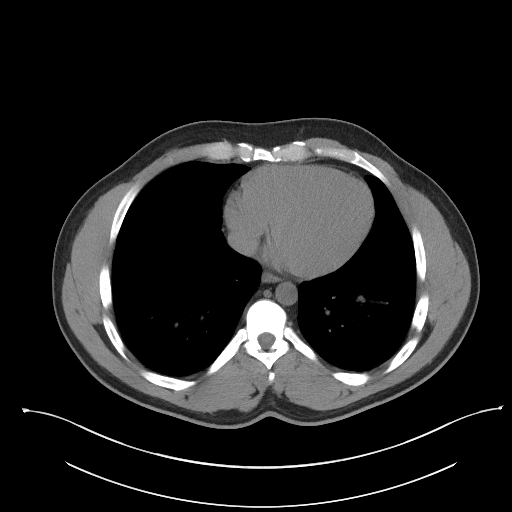

[Series 5: coronal · coronal · 0.80mm/px · 3 of 139 slices shown]
[im 47/139  soft-tissue]
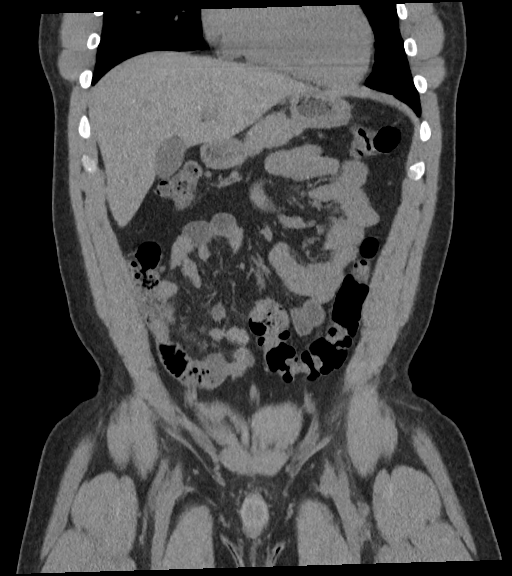
[im 62/139  soft-tissue]
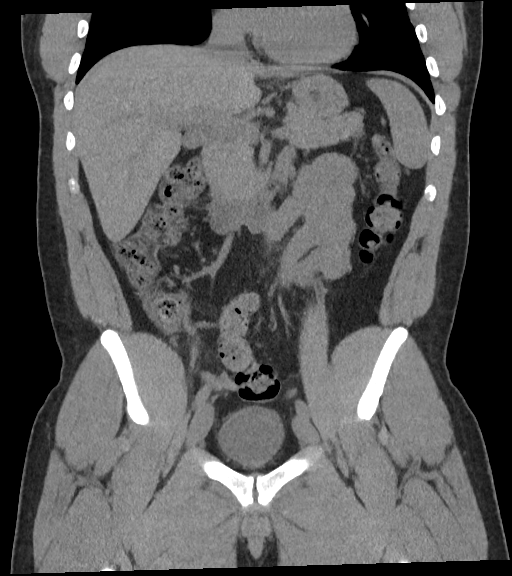
[im 77/139  soft-tissue]
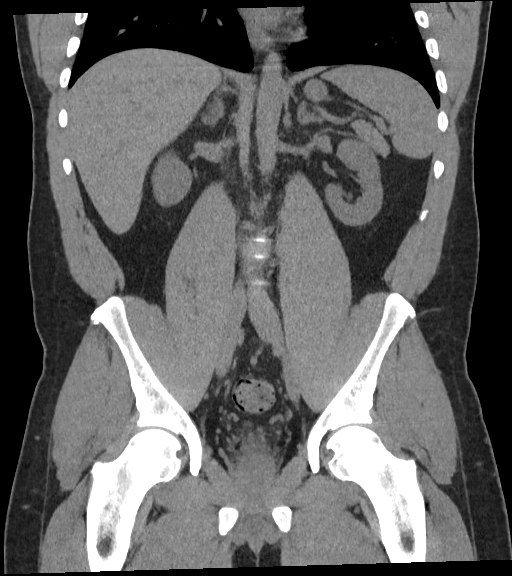

[17 of 46 positions shown; findings below may reference images not displayed]

FINDINGS: Lower chest: No acute abnormality.

Hepatobiliary: No focal liver abnormality is seen. No radiopaque
gallstones, biliary dilatation, or pericholecystic inflammatory
changes.

Pancreas: Unremarkable. No pancreatic ductal dilatation or
surrounding inflammatory changes.

Spleen: Normal in size without focal abnormality.

Adrenals/Urinary Tract: Adrenal glands are normal. Kidneys are
symmetric. No intrarenal calculi or renal mass. No hydronephrosis.
The bladder and visualized portion of the urethra are normal.

Stomach/Bowel: Stomach and small bowel loops are normal in
appearance. The appendix is well seen and normal in appearance.
Significant stool burden. Loops of colon are otherwise normal.

Vascular/Lymphatic: No significant vascular findings are present. No
enlarged abdominal or pelvic lymph nodes.

Reproductive: Prostate is unremarkable.

Other: Abdominal wall is unremarkable.  No ascites.

Musculoskeletal: No acute or significant osseous findings.
IMPRESSION: 1.  No evidence for acute abdominal and pelvic abnormality.
2. Normal appendix.
3. No nephrolithiasis or acute urinary tract abnormality.

## 2022-04-23 ENCOUNTER — Encounter (HOSPITAL_COMMUNITY): Payer: Self-pay | Admitting: Radiology

## 2022-04-23 ENCOUNTER — Other Ambulatory Visit: Payer: Self-pay

## 2022-04-23 ENCOUNTER — Emergency Department (HOSPITAL_COMMUNITY): Payer: BC Managed Care – PPO

## 2022-04-23 ENCOUNTER — Emergency Department (HOSPITAL_COMMUNITY)
Admission: EM | Admit: 2022-04-23 | Discharge: 2022-04-23 | Disposition: A | Discharge status: Home / Self Care | Payer: BC Managed Care – PPO | Attending: Emergency Medicine | Admitting: Emergency Medicine

## 2022-04-23 DIAGNOSIS — R109 Unspecified abdominal pain: Secondary | ICD-10-CM | POA: Diagnosis not present

## 2022-04-23 DIAGNOSIS — R1032 Left lower quadrant pain: Secondary | ICD-10-CM | POA: Diagnosis not present

## 2022-04-23 DIAGNOSIS — K5100 Ulcerative (chronic) pancolitis without complications: Secondary | ICD-10-CM | POA: Diagnosis not present

## 2022-04-23 LAB — CBC WITH DIFFERENTIAL/PLATELET
Abs Immature Granulocytes: 0.01 10*3/uL (ref 0.00–0.07)
Basophils Absolute: 0 10*3/uL (ref 0.0–0.1)
Basophils Relative: 0 %
Eosinophils Absolute: 0.2 10*3/uL (ref 0.0–0.5)
Eosinophils Relative: 2 %
HCT: 41 % (ref 39.0–52.0)
Hemoglobin: 12.4 g/dL — ABNORMAL LOW (ref 13.0–17.0)
Immature Granulocytes: 0 %
Lymphocytes Relative: 24 %
Lymphs Abs: 1.6 10*3/uL (ref 0.7–4.0)
MCH: 19.4 pg — ABNORMAL LOW (ref 26.0–34.0)
MCHC: 30.2 g/dL (ref 30.0–36.0)
MCV: 64.3 fL — ABNORMAL LOW (ref 80.0–100.0)
Monocytes Absolute: 0.5 10*3/uL (ref 0.1–1.0)
Monocytes Relative: 8 %
Neutro Abs: 4.3 10*3/uL (ref 1.7–7.7)
Neutrophils Relative %: 66 %
Platelets: 202 10*3/uL (ref 150–400)
RBC: 6.38 MIL/uL — ABNORMAL HIGH (ref 4.22–5.81)
RDW: 18.3 % — ABNORMAL HIGH (ref 11.5–15.5)
WBC: 6.5 10*3/uL (ref 4.0–10.5)
nRBC: 0 % (ref 0.0–0.2)

## 2022-04-23 LAB — URINALYSIS, ROUTINE W REFLEX MICROSCOPIC
Bilirubin Urine: NEGATIVE
Glucose, UA: NEGATIVE mg/dL
Hgb urine dipstick: NEGATIVE
Ketones, ur: NEGATIVE mg/dL
Leukocytes,Ua: NEGATIVE
Nitrite: NEGATIVE
Protein, ur: NEGATIVE mg/dL
Specific Gravity, Urine: 1.019 (ref 1.005–1.030)
pH: 6 (ref 5.0–8.0)

## 2022-04-23 LAB — COMPREHENSIVE METABOLIC PANEL
ALT: 42 U/L (ref 0–44)
AST: 27 U/L (ref 15–41)
Albumin: 3.9 g/dL (ref 3.5–5.0)
Alkaline Phosphatase: 107 U/L (ref 38–126)
Anion gap: 4 — ABNORMAL LOW (ref 5–15)
BUN: 8 mg/dL (ref 6–20)
CO2: 27 mmol/L (ref 22–32)
Calcium: 8.8 mg/dL — ABNORMAL LOW (ref 8.9–10.3)
Chloride: 104 mmol/L (ref 98–111)
Creatinine, Ser: 0.94 mg/dL (ref 0.61–1.24)
GFR, Estimated: >60 mL/min (ref >60)
Glucose, Bld: 94 mg/dL (ref 70–99)
Potassium: 4 mmol/L (ref 3.5–5.1)
Sodium: 135 mmol/L (ref 135–145)
Total Bilirubin: 0.8 mg/dL (ref 0.3–1.2)
Total Protein: 7 g/dL (ref 6.5–8.1)

## 2022-04-23 LAB — LIPASE, BLOOD: Lipase: 31 U/L (ref 11–51)

## 2022-04-23 MED ORDER — METRONIDAZOLE 500 MG PO TABS: 500.0000 mg | ORAL_TABLET | Freq: Two times a day (BID) | ORAL | 0 refills | Status: DC

## 2022-04-23 MED ORDER — IOHEXOL 300 MG/ML  SOLN
100.0000 mL | Freq: Once | INTRAMUSCULAR | Status: AC | PRN
Start: 2022-04-23 — End: 2022-04-23
  Administered 2022-04-23: 100 mL via INTRAVENOUS

## 2022-04-23 MED ORDER — CIPROFLOXACIN HCL 500 MG PO TABS: 500.0000 mg | ORAL_TABLET | Freq: Two times a day (BID) | ORAL | 0 refills | Status: DC

## 2022-04-23 NOTE — Discharge Instructions (Addendum)
Avoid alcohol, stick with a bland diet.  Your CT scan showed that you have a colitis which is likely causing your abdominal pain.  Take the antibiotic as directed until finished.  Please call the GI provider listed to arrange a follow-up appointment.  Return emergency department for any new or worsening symptoms.

## 2022-04-23 NOTE — ED Triage Notes (Signed)
Pt states he is having left sided flank pain & LLQ ABD pain since last week that comes & goes.  States no issues with bowels or urine.  Denies N/V/D.

## 2022-04-23 NOTE — ED Provider Notes (Signed)
[facility] Provider Note   CSN: [id number] Arrival date & time: 04/23/22  1007     History  Chief Complaint  Patient presents with   Flank Pain    Omar Norris is a 37 y.o. male.   Flank Pain Associated symptoms include abdominal pain. Pertinent negatives include no chest pain and no shortness of breath.       Omar Norris is a 37 y.o. male who presents to the Emergency Department complaining of left flank pain x 1 week.  States pain is intermittent and radiates to his left lower abdomen and sometimes to the right lower abdomen.  Pain occasionally worsens with intake of spicy or greasy food.  He denies any urine or bowel changes, nausea, vomiting or diarrhea.  He does not feel this pain is muscular.  He has been taking an over-the-counter Prilosec without significant relief.  Denies any history of kidney stones or abdominal surgeries, pain, weakness of numbness of his extremities   Home Medications Prior to Admission medications   Not on File      Allergies    Patient has no known allergies.    Review of Systems   Review of Systems  Constitutional:  Negative for chills and fever.  Respiratory:  Negative for shortness of breath.   Cardiovascular:  Negative for chest pain.  Gastrointestinal:  Positive for abdominal pain. Negative for constipation, diarrhea, nausea and vomiting.  Genitourinary:  Positive for flank pain. Negative for difficulty urinating, dysuria, frequency and hematuria.  Musculoskeletal:  Negative for back pain.  Skin:  Negative for color change and wound.  Neurological:  Negative for weakness and numbness.    Physical Exam Updated Vital Signs BP (!) 143/86 (BP Location: Right Arm)   Pulse 62   Temp 97.8 F (36.6 C) (Oral)   Resp 16   Ht '5\' 9"'$  (1.753 m)   Wt 99.8 kg   SpO2 100%   BMI 32.49 kg/m  Physical Exam Vitals and nursing note reviewed.  Constitutional:      General: He is not in acute  distress.    Appearance: Normal appearance. He is not toxic-appearing.  Cardiovascular:     Rate and Rhythm: Normal rate and regular rhythm.     Pulses: Normal pulses.  Pulmonary:     Effort: Pulmonary effort is normal. No respiratory distress.  Abdominal:     Palpations: Abdomen is soft.     Tenderness: There is abdominal tenderness. There is no right CVA tenderness or left CVA tenderness.     Comments: Mild tenderness of the left lower abdomen.  No guarding or rebound tenderness.  Abdomen is soft  Musculoskeletal:        General: Normal range of motion.  Skin:    General: Skin is warm.     Capillary Refill: Capillary refill takes less than 2 seconds.  Neurological:     General: No focal deficit present.     Mental Status: He is alert.     Sensory: No sensory deficit.     Motor: No weakness.     ED Results / Procedures / Treatments   Labs (all labs ordered are listed, but only abnormal results are displayed) Labs Reviewed  CBC WITH DIFFERENTIAL/PLATELET - Abnormal; Notable for the following components:      Result Value   RBC 6.38 (*)    Hemoglobin 12.4 (*)    MCV 64.3 (*)    MCH 19.4 (*)  RDW 18.3 (*)    All other components within normal limits  COMPREHENSIVE METABOLIC PANEL - Abnormal; Notable for the following components:   Calcium 8.8 (*)    Anion gap 4 (*)    All other components within normal limits  URINALYSIS, ROUTINE W REFLEX MICROSCOPIC  LIPASE, BLOOD    EKG None  Radiology CT ABDOMEN PELVIS W CONTRAST  Result Date: 04/23/2022 CLINICAL DATA:  Intermittent left-sided flank pain and left lower quadrant abdominal pain since last week EXAM: CT ABDOMEN AND PELVIS WITH CONTRAST TECHNIQUE: Multidetector CT imaging of the abdomen and pelvis was performed using the standard protocol following bolus administration of intravenous contrast. RADIATION DOSE REDUCTION: This exam was performed according to the departmental dose-optimization program which includes  automated exposure control, adjustment of the mA and/or kV according to patient size and/or use of iterative reconstruction technique. CONTRAST:  143m OMNIPAQUE IOHEXOL 300 MG/ML  SOLN COMPARISON:  CT December 14, 2020. FINDINGS: Lower chest: No acute abnormality. Hepatobiliary: No suspicious hepatic lesion. Gallbladder is unremarkable. No biliary ductal dilation. Pancreas: No pancreatic ductal dilation or evidence of acute inflammation. Spleen: No splenomegaly. Adrenals/Urinary Tract: Bilateral adrenal glands appear normal. No hydronephrosis. Kidneys demonstrate symmetric enhancement. Urinary bladder is unremarkable for degree of distension. Stomach/Bowel: No radiopaque enteric contrast material was administered. Stomach is unremarkable for degree of distension. No pathologic dilation of small or large bowel. Normal appendix. Pancolonic wall thickening with mild adjacent stranding. Vascular/Lymphatic: Normal caliber abdominal aorta. Smooth IVC contours. The portal, splenic and superior mesenteric veins are patent. No pathologically enlarged abdominal or pelvic lymph nodes. Reproductive: Prostate is unremarkable. Other: No significant abdominopelvic free fluid. No pneumoperitoneum. Musculoskeletal: No acute osseous abnormality. IMPRESSION: Pancolonic wall thickening with mild adjacent stranding, suggestive of an infectious or inflammatory colitis. Electronically Signed   By: JDahlia BailiffM.D.   On: 04/23/2022 12:50    Procedures Procedures    Medications Ordered in ED Medications - No data to display  ED Course/ Medical Decision Making/ A&P                             Medical Decision Making Patient here with 1 week history of left flank pain, left lower abdominal pain.  Pain occasionally radiates to the right side of his lower abdomen.  Endorses pain with intake of spicy or greasy food.  No vomiting, diarrhea, or urinary symptoms.   On exam, he does have some tenderness of the left lower abdomen.   Differential at this time would include acute diverticulitis and would also include kidney stone, pyelonephritis, MSK pain.  he notes history of acid reflux, but his current symptoms are felt less likely to be related to this.  Amount and/or Complexity of Data Reviewed Labs: ordered.    Details: Labs interpreted by me, no evidence of leukocytosis, chemistries without derangement.  Lipase unremarkable.  Urinalysis without evidence of hematuria or infection. Radiology: ordered.    Details: CT abdomen and pelvis ordered for further evaluation patient's symptoms.  Shows pancolonic wall thickening with mild adjacent stranding suggestive of infectious or inflammatory colitis. Discussion of management or test interpretation with external provider(s): Workup shows likely colitis.  He is well-appearing nontoxic.  Vital signs are reassuring.  Feel that he is appropriate for outpatient treatment with Cipro and metronidazole.  Will give referral information for local GI.    Risk Prescription drug management.           Final Clinical Impression(s) /  ED Diagnoses Final diagnoses:  Pancolitis [facility])    Rx / DC Orders ED Discharge Orders     None         Kem Parkinson, PA-C 04/23/22 1840    Milton Ferguson, MD 04/24/22 [phone]

## 2022-09-26 ENCOUNTER — Encounter (HOSPITAL_COMMUNITY): Payer: Self-pay | Admitting: *Deleted

## 2022-09-26 ENCOUNTER — Emergency Department (HOSPITAL_COMMUNITY): Payer: PRIVATE HEALTH INSURANCE

## 2022-09-26 ENCOUNTER — Emergency Department (HOSPITAL_COMMUNITY)
Admission: EM | Admit: 2022-09-26 | Discharge: 2022-09-26 | Disposition: A | Discharge status: Home / Self Care | Payer: PRIVATE HEALTH INSURANCE | Attending: Emergency Medicine | Admitting: Emergency Medicine

## 2022-09-26 ENCOUNTER — Other Ambulatory Visit: Payer: Self-pay

## 2022-09-26 DIAGNOSIS — S92014A Nondisplaced fracture of body of right calcaneus, initial encounter for closed fracture: Secondary | ICD-10-CM | POA: Diagnosis not present

## 2022-09-26 DIAGNOSIS — M7989 Other specified soft tissue disorders: Secondary | ICD-10-CM | POA: Diagnosis not present

## 2022-09-26 DIAGNOSIS — S99911A Unspecified injury of right ankle, initial encounter: Secondary | ICD-10-CM | POA: Diagnosis present

## 2022-09-26 DIAGNOSIS — W108XXA Fall (on) (from) other stairs and steps, initial encounter: Secondary | ICD-10-CM | POA: Insufficient documentation

## 2022-09-26 DIAGNOSIS — S92001A Unspecified fracture of right calcaneus, initial encounter for closed fracture: Secondary | ICD-10-CM

## 2022-09-26 DIAGNOSIS — M25571 Pain in right ankle and joints of right foot: Secondary | ICD-10-CM | POA: Diagnosis not present

## 2022-09-26 DIAGNOSIS — S93401A Sprain of unspecified ligament of right ankle, initial encounter: Secondary | ICD-10-CM

## 2022-09-26 DIAGNOSIS — Y9301 Activity, walking, marching and hiking: Secondary | ICD-10-CM | POA: Insufficient documentation

## 2022-09-26 NOTE — ED Triage Notes (Signed)
Pt with right ankle pain, noted yesterday. Admits to do a lot of walking yesterday.  Pt states may have done something after stepping down on it wrong getting out of his truck on Thursday.

## 2022-09-26 NOTE — Discharge Instructions (Addendum)
Return if any problems.  Ibuprofen for discomfort 

## 2022-09-26 NOTE — ED Provider Notes (Signed)
[address] EMERGENCY DEPARTMENT AT [facility] Provider Note   CSN: [id number] Arrival date & time: 09/26/22  1707     History  Chief Complaint  Patient presents with   Ankle Pain    Omar Norris is a 37 y.o. male.  Reports he stepped down from his truck yesterday and hit the ground hard patient reports that he noticed some swelling afterwards.  Patient reports today he has more swelling in his foot and in his ankle.  Patient complains of pain with walking.  No language interpreter was used.  Ankle Pain Location:  Ankle Time since incident:  2 days Injury: no   Ankle location:  L ankle Pain details:    Quality:  Aching   Radiates to:  Does not radiate   Severity:  Moderate   Onset quality:  Gradual   Timing:  Constant   Progression:  Worsening Chronicity:  New Relieved by:  Nothing Worsened by:  Nothing Ineffective treatments:  None tried Associated symptoms: swelling        Home Medications Prior to Admission medications   Medication Sig Start Date End Date Taking? Authorizing Provider  ciprofloxacin (CIPRO) 500 MG tablet Take 1 tablet (500 mg total) by mouth 2 (two) times daily. 04/23/22   Triplett, Tammy, PA-C  metroNIDAZOLE (FLAGYL) 500 MG tablet Take 1 tablet (500 mg total) by mouth 2 (two) times daily. 04/23/22   Triplett, Babette Relic, PA-C      Allergies    Patient has no known allergies.    Review of Systems   Review of Systems  All other systems reviewed and are negative.   Physical Exam Updated Vital Signs BP 139/81   Pulse 85   Temp 98 F (36.7 C)   Resp 18   Ht 5\' 9"  (1.753 m)   Wt 98.4 kg   SpO2 100%   BMI 32.05 kg/m  Physical Exam Vitals and nursing note reviewed.  Constitutional:      Appearance: He is well-developed.  HENT:     Head: Normocephalic.  Pulmonary:     Effort: Pulmonary effort is normal.  Abdominal:     General: There is no distension.  Musculoskeletal:        General: Swelling and tenderness present.      Cervical back: Normal range of motion.     Comments: Swollen tender right ankle normal range of motion neurovascular neurosensory intact  Neurological:     Mental Status: He is alert and oriented to person, place, and time.     ED Results / Procedures / Treatments   Labs (all labs ordered are listed, but only abnormal results are displayed) Labs Reviewed - No data to display  EKG None  Radiology DG Ankle Complete Right  Result Date: 09/26/2022 CLINICAL DATA:  Right ankle pain and swelling. EXAM: RIGHT ANKLE - COMPLETE 3+ VIEW COMPARISON:  None Available. FINDINGS: There is no evidence of dislocation or joint effusion. There is a posterior calcaneal enthesophyte with an apparent defect which may reflect acute fracture. There is soft tissue swelling around the ankle. IMPRESSION: Possible acute fracture of a posterior calcaneal enthesophyte. Electronically Signed   By: Romona Curls M.D.   On: 09/26/2022 18:22    Procedures Procedures    Medications Ordered in ED Medications - No data to display  ED Course/ Medical Decision Making/ A&P  Medical Decision Making Reports landing hard on his right foot yesterday.  Patient complains of swelling and pain to his foot and ankle today  Amount and/or Complexity of Data Reviewed Radiology: ordered and independent interpretation performed. Decision-making details documented in ED Course.    Details: Strays shows possible acute fracture of posterior calcaneal  Risk Risk Details: Patient counseled on x-ray.  Patient is placed in a cam walker and given crutches.  He is advised to schedule to see the orthopedist for evaluation this week.  Patient will take ibuprofen for discomfort patient is advised to return to the emergency department if any problems.           Final Clinical Impression(s) / ED Diagnoses Final diagnoses:  Sprain of right ankle, unspecified ligament, initial encounter  Closed  nondisplaced fracture of right calcaneus, unspecified portion of calcaneus, initial encounter    Rx / DC Orders ED Discharge Orders     None      An After Visit Summary was printed and given to the patient.    Osie Cheeks 09/26/22 2340    Bethann Berkshire, MD 09/27/22 1336

## 2022-10-22 IMAGING — DX DG FINGER MIDDLE 2+V*L*
3 series · 3 of 3 positions shown · non-contrast
Comparison: None Available.

CLINICAL DATA: Crush injury to middle finger

EXAM:
LEFT MIDDLE FINGER 2+V

[finger pa]
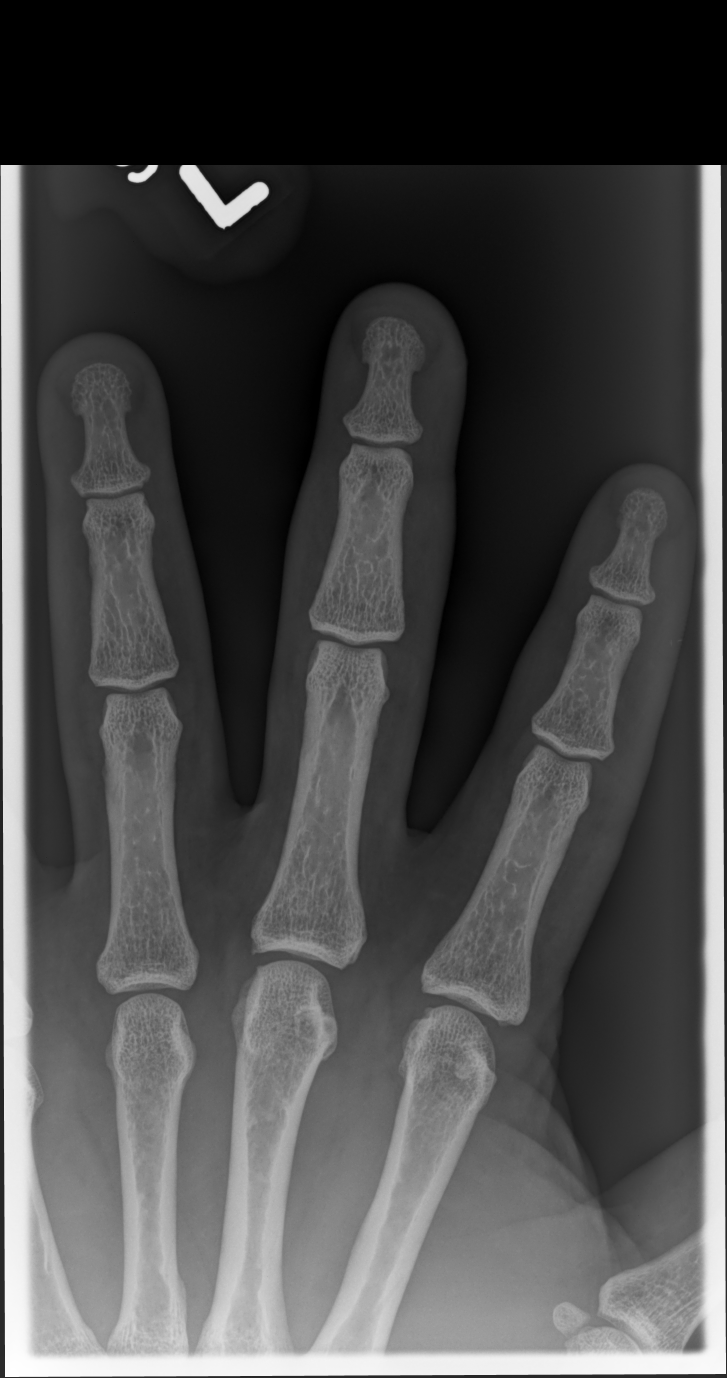

[finger mlo]
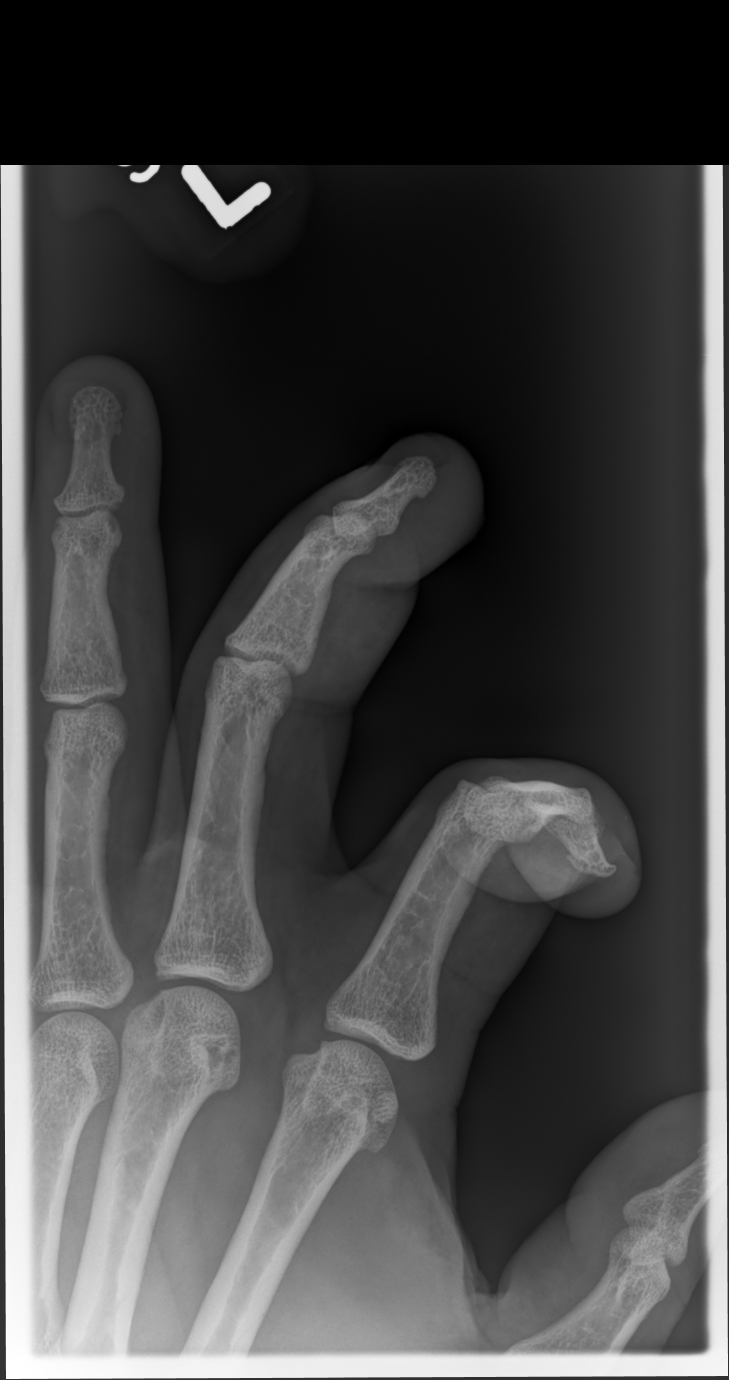

[2. finger lat]
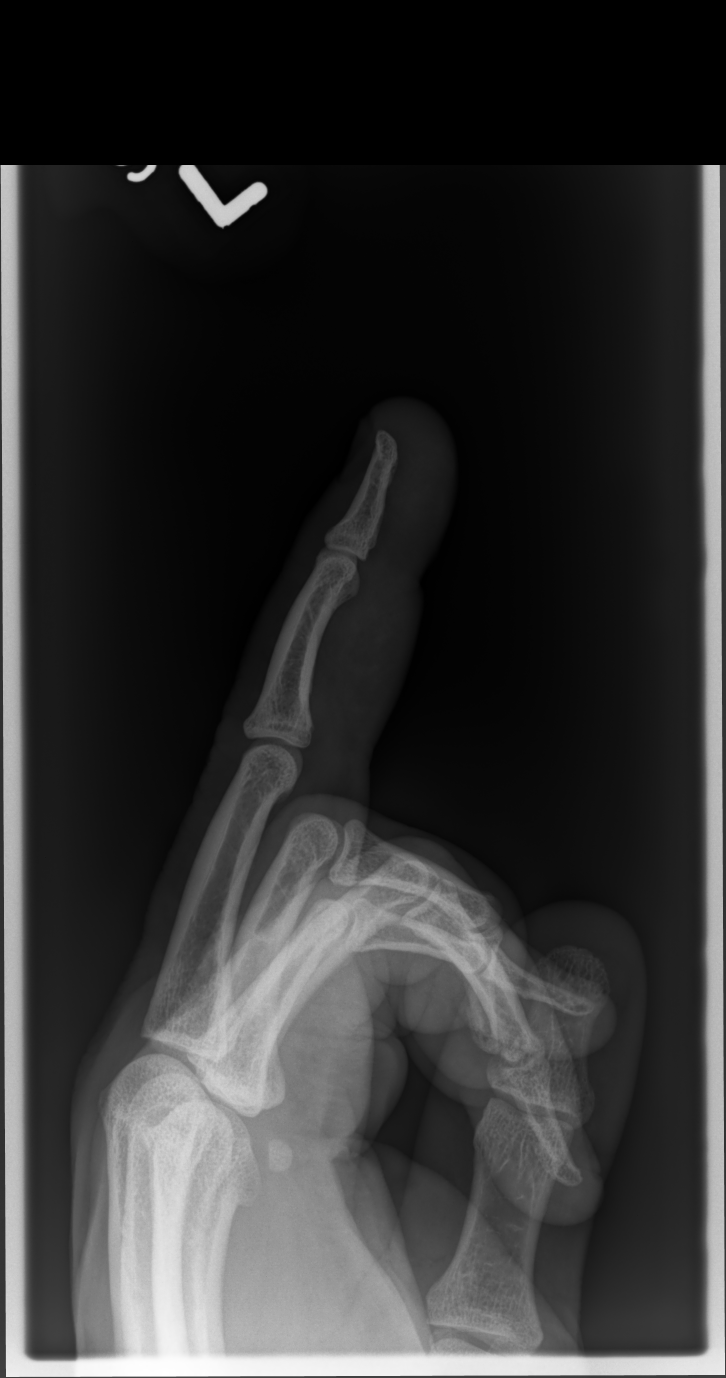

[3 of 3 positions shown; findings below may reference images not displayed]

FINDINGS: Soft tissue swelling but no evidence of fracture or malalignment.
Bones are intact and unremarkable.
IMPRESSION: Soft tissue swelling without evidence of fracture.

## 2022-11-26 ENCOUNTER — Telehealth: Payer: Self-pay

## 2022-11-26 NOTE — Telephone Encounter (Signed)
LVM asking patient to call back, patient needs help to establish a PCP

## 2023-01-26 ENCOUNTER — Emergency Department (HOSPITAL_COMMUNITY)
Admission: EM | Admit: 2023-01-26 | Discharge: 2023-01-26 | Disposition: A | Discharge status: Home / Self Care | Payer: Self-pay | Attending: Emergency Medicine | Admitting: Emergency Medicine

## 2023-01-26 ENCOUNTER — Other Ambulatory Visit: Payer: Self-pay

## 2023-01-26 DIAGNOSIS — R1032 Left lower quadrant pain: Secondary | ICD-10-CM | POA: Diagnosis not present

## 2023-01-26 LAB — URINALYSIS, ROUTINE W REFLEX MICROSCOPIC
Bilirubin Urine: NEGATIVE
Glucose, UA: NEGATIVE mg/dL
Hgb urine dipstick: NEGATIVE
Ketones, ur: NEGATIVE mg/dL
Leukocytes,Ua: NEGATIVE
Nitrite: NEGATIVE
Protein, ur: 30 mg/dL — AB
Specific Gravity, Urine: 1.03 (ref 1.005–1.030)
pH: 5 (ref 5.0–8.0)

## 2023-01-26 LAB — COMPREHENSIVE METABOLIC PANEL
ALT: 39 U/L (ref 0–44)
AST: 24 U/L (ref 15–41)
Albumin: 3.9 g/dL (ref 3.5–5.0)
Alkaline Phosphatase: 100 U/L (ref 38–126)
Anion gap: 7 (ref 5–15)
BUN: 11 mg/dL (ref 6–20)
CO2: 27 mmol/L (ref 22–32)
Calcium: 9.1 mg/dL (ref 8.9–10.3)
Chloride: 103 mmol/L (ref 98–111)
Creatinine, Ser: 1.01 mg/dL (ref 0.61–1.24)
GFR, Estimated: >60 mL/min (ref >60)
Glucose, Bld: 101 mg/dL — ABNORMAL HIGH (ref 70–99)
Potassium: 4 mmol/L (ref 3.5–5.1)
Sodium: 137 mmol/L (ref 135–145)
Total Bilirubin: 1.1 mg/dL (ref <1.2)
Total Protein: 6.8 g/dL (ref 6.5–8.1)

## 2023-01-26 LAB — CBC
HCT: 41.7 % (ref 39.0–52.0)
Hemoglobin: 12.9 g/dL — ABNORMAL LOW (ref 13.0–17.0)
MCH: 19.9 pg — ABNORMAL LOW (ref 26.0–34.0)
MCHC: 30.9 g/dL (ref 30.0–36.0)
MCV: 64.5 fL — ABNORMAL LOW (ref 80.0–100.0)
Platelets: 285 10*3/uL (ref 150–400)
RBC: 6.47 MIL/uL — ABNORMAL HIGH (ref 4.22–5.81)
RDW: 17.3 % — ABNORMAL HIGH (ref 11.5–15.5)
WBC: 6.8 10*3/uL (ref 4.0–10.5)
nRBC: 0 % (ref 0.0–0.2)

## 2023-01-26 LAB — LIPASE, BLOOD: Lipase: 33 U/L (ref 11–51)

## 2023-01-26 MED ORDER — AMOXICILLIN-POT CLAVULANATE 875-125 MG PO TABS: 1.0000 | ORAL_TABLET | Freq: Two times a day (BID) | ORAL | 0 refills | Status: DC

## 2023-01-26 NOTE — ED Notes (Signed)
Patient verbalizes understanding of discharge instructions. Opportunity for questioning and answers were provided. Armband removed by staff, pt discharged from ED. Ambulated out to lobby  

## 2023-01-26 NOTE — ED Triage Notes (Signed)
Pt c/o LLQ pain that began after eating around 2300. Denies N/V/D.

## 2023-01-26 NOTE — ED Provider Notes (Signed)
[place] EMERGENCY DEPARTMENT AT [facility] Provider Note   CSN: [id number] Arrival date & time: 01/26/23  0016     History  Chief Complaint  Patient presents with   Abdominal Pain    Omar Norris is a 37 y.o. male.  The history is provided by the patient.   Patient presents with abdominal pain.  Patient reports this pain episode began around 2 days ago.  Its mostly in the left lower quadrant intermittently.  No fevers or vomiting.  No diarrhea or constipation.  No bloody or dark stool He is having appropriate bowel movements.  No dysuria or testicle pain.  No previous abdominal surgeries.  He reports similar episode last March and took antibiotics and it improved.  He will have occasional pain in that same location.  He has ha made significant dietary changes    Past Medical History:  Diagnosis Date   Beta thalassemia (HCC)     Home Medications Prior to Admission medications   Medication Sig Start Date End Date Taking? Authorizing Provider  amoxicillin-clavulanate (AUGMENTIN) 875-125 MG tablet Take 1 tablet by mouth every 12 (twelve) hours. 01/26/23  Yes Zadie Rhine, MD      Allergies    Patient has no known allergies.    Review of Systems   Review of Systems  Constitutional:  Negative for fever.  Gastrointestinal:  Positive for abdominal pain. Negative for blood in stool, constipation and vomiting.  Genitourinary:  Negative for dysuria and testicular pain.    Physical Exam Updated Vital Signs BP 128/83   Pulse 84   Temp 97.9 F (36.6 C) (Oral)   Resp 18   Ht 1.753 m (5\' 9" )   Wt 99.8 kg   SpO2 100%   BMI 32.49 kg/m  Physical Exam CONSTITUTIONAL: Well developed/well nourished, watching television HEAD: Normocephalic/atraumatic EYES: EOMI ENMT: Mucous membranes moist NECK: supple no meningeal signs CV: S1/S2 noted, no murmurs/rubs/gallops noted LUNGS: Lungs are clear to auscultation bilaterally, no apparent distress ABDOMEN: soft,  nontender, no rebound or guarding, bowel sounds noted throughout abdomen GU:no cva tenderness NEURO: Pt is awake/alert/appropriate, moves all extremitiesx4.  No facial droop.   EXTREMITIES:  full ROM SKIN: warm, color normal PSYCH: no abnormalities of mood noted, alert and oriented to situation  ED Results / Procedures / Treatments   Labs (all labs ordered are listed, but only abnormal results are displayed) Labs Reviewed  COMPREHENSIVE METABOLIC PANEL - Abnormal; Notable for the following components:      Result Value   Glucose, Bld 101 (*)    All other components within normal limits  CBC - Abnormal; Notable for the following components:   RBC 6.47 (*)    Hemoglobin 12.9 (*)    MCV 64.5 (*)    MCH 19.9 (*)    RDW 17.3 (*)    All other components within normal limits  URINALYSIS, ROUTINE W REFLEX MICROSCOPIC - Abnormal; Notable for the following components:   Color, Urine AMBER (*)    Protein, ur 30 (*)    Bacteria, UA RARE (*)    All other components within normal limits  LIPASE, BLOOD    EKG None  Radiology No results found.  Procedures Procedures    Medications Ordered in ED Medications - No data to display  ED Course/ Medical Decision Making/ A&P  Medical Decision Making Amount and/or Complexity of Data Reviewed Labs: ordered.  Risk Prescription drug management.   This patient presents to the ED for concern of abdominal pain, this involves an extensive number of treatment options, and is a complaint that carries with it a high risk of complications and morbidity.  The differential diagnosis includes but is not limited to cholecystitis, cholelithiasis, pancreatitis, gastritis, peptic ulcer disease, appendicitis, bowel obstruction, bowel perforation, diverticulitis, AAA, ischemic bowel    Social determinant of health-lack of access to primary care  Additional history obtained: Records reviewed Care Everywhere/External  Records  Lab Tests: I Ordered, and personally interpreted labs.  The pertinent results include: Labs overall unremarkable  Complexity of problems addressed: Patient's presentation is most consistent with  acute complicated illness/injury requiring diagnostic workup  Disposition: After consideration of the diagnostic results and the patient's response to treatment,  I feel that the patent would benefit from discharge   .    Patient presents for left lower quadrant pain that has since resolved.  Similar previous episode of colitis that responded to Cipro Flagyl  Given his history, will empirically start oral antibiotics with Augmentin.  Will defer any imaging as he is back to baseline.  However given his recurrent abdominal pain with previous history of colitis, I stressed the need for follow-up as an outpatient with gastroenterology.  He would likely benefit from a colonoscopy.  Advised patient that he should be screening for colon cancer, and he agrees       Final Clinical Impression(s) / ED Diagnoses Final diagnoses:  Left lower quadrant abdominal pain    Rx / DC Orders ED Discharge Orders          Ordered    amoxicillin-clavulanate (AUGMENTIN) 875-125 MG tablet  Every 12 hours        01/26/23 0449              Zadie Rhine, MD 01/26/23 [phone]

## 2023-01-26 NOTE — Discharge Instructions (Signed)
As we discussed, please follow-up with the gastro specialist in the next month as you may need to have a colonoscopy to rule out colon cancer

## 2023-02-16 ENCOUNTER — Other Ambulatory Visit: Payer: Self-pay

## 2023-02-16 ENCOUNTER — Encounter (HOSPITAL_COMMUNITY): Payer: Self-pay

## 2023-02-16 ENCOUNTER — Emergency Department (HOSPITAL_COMMUNITY)
Admission: EM | Admit: 2023-02-16 | Discharge: 2023-02-16 | Disposition: A | Discharge status: Home / Self Care | Payer: BC Managed Care – PPO | Attending: Emergency Medicine | Admitting: Emergency Medicine

## 2023-02-16 DIAGNOSIS — M79662 Pain in left lower leg: Secondary | ICD-10-CM | POA: Diagnosis not present

## 2023-02-16 DIAGNOSIS — R202 Paresthesia of skin: Secondary | ICD-10-CM

## 2023-02-16 DIAGNOSIS — R200 Anesthesia of skin: Secondary | ICD-10-CM | POA: Insufficient documentation

## 2023-02-16 DIAGNOSIS — M79605 Pain in left leg: Secondary | ICD-10-CM | POA: Diagnosis not present

## 2023-02-16 LAB — BASIC METABOLIC PANEL
Anion gap: 6 (ref 5–15)
BUN: 11 mg/dL (ref 6–20)
CO2: 24 mmol/L (ref 22–32)
Calcium: 8.8 mg/dL — ABNORMAL LOW (ref 8.9–10.3)
Chloride: 103 mmol/L (ref 98–111)
Creatinine, Ser: 0.87 mg/dL (ref 0.61–1.24)
GFR, Estimated: >60 mL/min (ref >60)
Glucose, Bld: 90 mg/dL (ref 70–99)
Potassium: 3.7 mmol/L (ref 3.5–5.1)
Sodium: 133 mmol/L — ABNORMAL LOW (ref 135–145)

## 2023-02-16 MED ORDER — NAPROXEN 250 MG PO TABS
500.0000 mg | ORAL_TABLET | Freq: Once | ORAL | Status: AC
  Administered 2023-02-16: 500 mg via ORAL
  Filled 2023-02-16: qty 2

## 2023-02-16 NOTE — Discharge Instructions (Signed)
The labs in the emergency room are overall reassuring. We are not sure what is causing your symptoms.  We do not suspect blood clots, infection, acute neurologic disorder such as stroke, or spinal cord issues.  The workup in the ER is not complete, and is limited to screening for life threatening and emergent conditions only, so please see a primary care doctor for further evaluation.  Return to the emergency room if you start having numbness and tingling that is progressing up towards your upper extremity, new weakness in your legs, new swelling in your leg.

## 2023-02-16 NOTE — ED Provider Notes (Signed)
[place] EMERGENCY DEPARTMENT AT [facility] Provider Note   CSN: [id number] Arrival date & time: 02/16/23  2051     History  Chief Complaint  Patient presents with   Numbness    Omar Norris is a 37 y.o. male.  HPI    37 year old male comes in with chief complaint of numbness and discomfort to the left leg.  Patient stated about 2 days ago, he started noticing some cramping type pain over the back of his left knee and into the calf area.  Subsequently he has also had some numbness type feeling over the same extremity.  Prior to the symptoms beginning, he had some discomfort over the testicle area that was moving towards his thigh.  Today while he was working out, he also noted some tingling sensation in his hand and decided to come to the ER.  Patient has no medical history.  He denies any stimulant use, supplements, substance use disorder and there is no family history of MS or any other neurologic disorder in young age.  Patient also denies any history of blood clots in his legs or lungs and denies any recent long distance travels, major surgeries.  Home Medications Prior to Admission medications   Medication Sig Start Date End Date Taking? Authorizing Provider  amoxicillin-clavulanate (AUGMENTIN) 875-125 MG tablet Take 1 tablet by mouth every 12 (twelve) hours. 01/26/23   Zadie Rhine, MD      Allergies    Patient has no known allergies.    Review of Systems   Review of Systems  All other systems reviewed and are negative.   Physical Exam Updated Vital Signs BP (!) 134/93   Pulse 82   Temp 98 F (36.7 C) (Oral)   Resp 18   Ht 5\' 9"  (1.753 m)   Wt 98.4 kg   SpO2 100%   BMI 32.05 kg/m  Physical Exam Vitals and nursing note reviewed.  Constitutional:      Appearance: He is well-developed.  HENT:     Head: Atraumatic.  Cardiovascular:     Rate and Rhythm: Normal rate.  Pulmonary:     Effort: Pulmonary effort is normal.  Musculoskeletal:         General: No tenderness.     Cervical back: Neck supple.     Left lower leg: No edema.     Comments: No unilateral calf swelling or tenderness  Skin:    General: Skin is warm.  Neurological:     Mental Status: He is alert and oriented to person, place, and time.     Comments: Gross sensory exam for the lower extremities normal and reassuring.  Motor strength is 4+ out of 5 bilaterally for lower extremity, upper extremity sensory and motor exam is normal.     ED Results / Procedures / Treatments   Labs (all labs ordered are listed, but only abnormal results are displayed) Labs Reviewed  BASIC METABOLIC PANEL    EKG EKG Interpretation Date/Time:  Wednesday February 16 2023 21:18:57 EST Ventricular Rate:  80 PR Interval:  215 QRS Duration:  92 QT Interval:  349 QTC Calculation: 403 R Axis:   21  Text Interpretation: Sinus rhythm Prolonged PR interval Probable anteroseptal infarct, old No acute changes No significant change since last tracing Confirmed by Derwood Kaplan [phone]) on 02/16/2023 10:58:40 PM  Radiology No results found.  Procedures Procedures    Medications Ordered in ED Medications  naproxen (NAPROSYN) tablet 500 mg (500 mg Oral Given  02/16/23 2200)    ED Course/ Medical Decision Making/ A&P                                 Medical Decision Making Amount and/or Complexity of Data Reviewed Labs: ordered.  Risk Prescription drug management.   37 year old patient comes in with chief complaint of leg pain and some numbness.  He has no medical history.  He is family history, social history are also reassuring.  Differential diagnosis considered this patient includes myelopathy, electrolyte abnormality, ascending demyelinating process, DVT muscle strain and myositis.  However exam is reassuring.  He has no unilateral swelling.  Neurologic exam is normal.  Patient has no back pain.  He has no red flag suggesting acute neurologic process.  Plan is to  get basic labs to make sure there is no profound electrolyte abnormality given that patient admits to heavy workouts.  I do not think CK is indicated at this time.  On exam, DVT suspicion is extremely low, D-dimer not ordered as I am not clinically concerned for DVT.  If the blood work is reassuring patient can be discharged w/ reassurance.   Final Clinical Impression(s) / ED Diagnoses Final diagnoses:  Paresthesia  Pain of left lower extremity    Rx / DC Orders ED Discharge Orders     None         Derwood Kaplan, MD 02/16/23 2303

## 2023-02-16 NOTE — ED Triage Notes (Signed)
Pt states numbness in left arm and left leg. Pt states pain in his left groin area on Monday that has subsided. Pt states he was at the gym lifting weights and became dizzy with the arm and leg numbness so decided to come be checked out.

## 2023-02-26 ENCOUNTER — Emergency Department (HOSPITAL_COMMUNITY)
Admission: EM | Admit: 2023-02-26 | Discharge: 2023-02-26 | Disposition: A | Discharge status: Home / Self Care | Payer: BC Managed Care – PPO | Attending: Emergency Medicine | Admitting: Emergency Medicine

## 2023-02-26 ENCOUNTER — Encounter (HOSPITAL_COMMUNITY): Payer: Self-pay | Admitting: *Deleted

## 2023-02-26 ENCOUNTER — Other Ambulatory Visit: Payer: Self-pay

## 2023-02-26 ENCOUNTER — Emergency Department (HOSPITAL_COMMUNITY): Payer: BC Managed Care – PPO

## 2023-02-26 DIAGNOSIS — K2900 Acute gastritis without bleeding: Secondary | ICD-10-CM | POA: Diagnosis not present

## 2023-02-26 DIAGNOSIS — F17200 Nicotine dependence, unspecified, uncomplicated: Secondary | ICD-10-CM | POA: Diagnosis not present

## 2023-02-26 DIAGNOSIS — R0602 Shortness of breath: Secondary | ICD-10-CM | POA: Diagnosis not present

## 2023-02-26 DIAGNOSIS — R1013 Epigastric pain: Secondary | ICD-10-CM | POA: Diagnosis not present

## 2023-02-26 DIAGNOSIS — R0789 Other chest pain: Secondary | ICD-10-CM | POA: Diagnosis not present

## 2023-02-26 LAB — CBC WITH DIFFERENTIAL/PLATELET
Abs Immature Granulocytes: 0.04 10*3/uL (ref 0.00–0.07)
Basophils Absolute: 0 10*3/uL (ref 0.0–0.1)
Basophils Relative: 0 %
Eosinophils Absolute: 0.1 10*3/uL (ref 0.0–0.5)
Eosinophils Relative: 1 %
HCT: 40.9 % (ref 39.0–52.0)
Hemoglobin: 12.8 g/dL — ABNORMAL LOW (ref 13.0–17.0)
Immature Granulocytes: 1 %
Lymphocytes Relative: 19 %
Lymphs Abs: 1.6 10*3/uL (ref 0.7–4.0)
MCH: 19.7 pg — ABNORMAL LOW (ref 26.0–34.0)
MCHC: 31.3 g/dL (ref 30.0–36.0)
MCV: 63 fL — ABNORMAL LOW (ref 80.0–100.0)
Monocytes Absolute: 0.6 10*3/uL (ref 0.1–1.0)
Monocytes Relative: 7 %
Neutro Abs: 6.4 10*3/uL (ref 1.7–7.7)
Neutrophils Relative %: 72 %
Platelets: 299 10*3/uL (ref 150–400)
RBC: 6.49 MIL/uL — ABNORMAL HIGH (ref 4.22–5.81)
RDW: 17.8 % — ABNORMAL HIGH (ref 11.5–15.5)
WBC: 8.7 10*3/uL (ref 4.0–10.5)
nRBC: 0 % (ref 0.0–0.2)

## 2023-02-26 LAB — COMPREHENSIVE METABOLIC PANEL
ALT: 36 U/L (ref 0–44)
AST: 32 U/L (ref 15–41)
Albumin: 4.3 g/dL (ref 3.5–5.0)
Alkaline Phosphatase: 98 U/L (ref 38–126)
Anion gap: 10 (ref 5–15)
BUN: 10 mg/dL (ref 6–20)
CO2: 24 mmol/L (ref 22–32)
Calcium: 9.1 mg/dL (ref 8.9–10.3)
Chloride: 104 mmol/L (ref 98–111)
Creatinine, Ser: 0.94 mg/dL (ref 0.61–1.24)
GFR, Estimated: >60 mL/min (ref >60)
Glucose, Bld: 96 mg/dL (ref 70–99)
Potassium: 4.1 mmol/L (ref 3.5–5.1)
Sodium: 138 mmol/L (ref 135–145)
Total Bilirubin: 1 mg/dL (ref 0.0–1.2)
Total Protein: 7.3 g/dL (ref 6.5–8.1)

## 2023-02-26 LAB — TROPONIN I (HIGH SENSITIVITY): Troponin I (High Sensitivity): 3 ng/L (ref <18)

## 2023-02-26 LAB — LIPASE, BLOOD: Lipase: 30 U/L (ref 11–51)

## 2023-02-26 MED ORDER — LIDOCAINE VISCOUS HCL 2 % MT SOLN
15.0000 mL | Freq: Once | OROMUCOSAL | Status: AC
  Administered 2023-02-26: 15 mL via ORAL
  Filled 2023-02-26: qty 15

## 2023-02-26 MED ORDER — ALUM & MAG HYDROXIDE-SIMETH 200-200-20 MG/5ML PO SUSP
30.0000 mL | Freq: Once | ORAL | Status: AC
  Administered 2023-02-26: 30 mL via ORAL
  Filled 2023-02-26: qty 30

## 2023-02-26 NOTE — ED Provider Notes (Signed)
 [place] EMERGENCY DEPARTMENT AT [facility] Provider Note  CSN: [id number] Arrival date & time: 02/26/23 1320  Chief Complaint(s) Abdominal Pain  HPI Omar Norris is a 38 y.o. male with history of beta thalassemia presenting to the emergency department with abdominal pain.  Patient reports abdominal pain is epigastric region.  Reports that started this morning, feels like a burning pain, feels similar to previous episodes of inflammation in the stomach or reflux.  Reports that he was previously on Nexium  which helped however he has not been taking this recently.  Reports when the pain was more severe he felt short of breath however this is resolved.  No chest pain.  No black or bloody stools.  No diarrhea.  No nausea or vomiting.   Past Medical History Past Medical History:  Diagnosis Date   Beta thalassemia [facility])    Patient Active Problem List   Diagnosis Date Noted   Obesity (BMI 30-39.9) 01/07/2020   Tobacco use 09/07/2019   Lipoma of forehead 09/19/2015   Sore throat 03/18/2012   Seasonal allergies 11/09/2011   Home Medication(s) Prior to Admission medications   Medication Sig Start Date End Date Taking? Authorizing Provider  amoxicillin -clavulanate (AUGMENTIN ) 875-125 MG tablet Take 1 tablet by mouth every 12 (twelve) hours. 01/26/23   Midge Golas, MD                                                                                                                                    Past Surgical History History reviewed. No pertinent surgical history. Family History Family History  Problem Relation Age of Onset   Hypertension Mother    Hyperlipidemia Mother     Social History Social History   Tobacco Use   Smoking status: Former    Types: Cigars   Smokeless tobacco: Never   Tobacco comments:    weekends  Vaping Use   Vaping status: Former  Substance Use Topics   Alcohol use: Yes    Alcohol/week: 7.0 standard drinks of alcohol    Types: 2 Cans  of beer, 5 Shots of liquor per week    Comment: Occas   Drug use: No   Allergies Patient has no known allergies.  Review of Systems Review of Systems  All other systems reviewed and are negative.   Physical Exam Vital Signs  I have reviewed the triage vital signs BP (!) 159/96 (BP Location: Right Arm)   Pulse (!) 106   Temp 98.3 F (36.8 C) (Oral)   Resp 18   Ht 5' 9 (1.753 m)   Wt 97.5 kg   SpO2 98%   BMI 31.75 kg/m  Physical Exam Vitals and nursing note reviewed.  Constitutional:      General: He is not in acute distress.    Appearance: Normal appearance.  HENT:     Mouth/Throat:     Mouth: Mucous membranes are moist.  Eyes:  Conjunctiva/sclera: Conjunctivae normal.  Cardiovascular:     Rate and Rhythm: Normal rate and regular rhythm.  Pulmonary:     Effort: Pulmonary effort is normal. No respiratory distress.     Breath sounds: Normal breath sounds.  Abdominal:     General: Abdomen is flat.     Palpations: Abdomen is soft.     Tenderness: There is no abdominal tenderness.  Musculoskeletal:     Right lower leg: No edema.     Left lower leg: No edema.  Skin:    General: Skin is warm and dry.     Capillary Refill: Capillary refill takes less than 2 seconds.  Neurological:     Mental Status: He is alert and oriented to person, place, and time. Mental status is at baseline.  Psychiatric:        Mood and Affect: Mood normal.        Behavior: Behavior normal.     ED Results and Treatments Labs (all labs ordered are listed, but only abnormal results are displayed) Labs Reviewed  CBC WITH DIFFERENTIAL/PLATELET - Abnormal; Notable for the following components:      Result Value   RBC 6.49 (*)    Hemoglobin 12.8 (*)    MCV 63.0 (*)    MCH 19.7 (*)    RDW 17.8 (*)    All other components within normal limits  LIPASE, BLOOD  COMPREHENSIVE METABOLIC PANEL  URINALYSIS, ROUTINE W REFLEX MICROSCOPIC  TROPONIN I (HIGH SENSITIVITY)                                                                                                                           Radiology DG Chest 2 View Result Date: 02/26/2023 CLINICAL DATA:  Shortness of breath. EXAM: CHEST - 2 VIEW COMPARISON:  Radiographs 01/12/2022 and 01/25/2021. FINDINGS: The heart size and mediastinal contours are normal. The lungs are clear. There is no pleural effusion or pneumothorax. No acute osseous findings are identified. IMPRESSION: No evidence of acute cardiopulmonary process. Electronically Signed   By: Elsie Perone M.D.   On: 02/26/2023 14:27    Pertinent labs & imaging results that were available during my care of the patient were reviewed by me and considered in my medical decision making (see MDM for details).  Medications Ordered in ED Medications  alum & mag hydroxide-simeth (MAALOX/MYLANTA) 200-200-20 MG/5ML suspension 30 mL (has no administration in time range)    And  lidocaine  (XYLOCAINE ) 2 % viscous mouth solution 15 mL (has no administration in time range)  Procedures Procedures  (including critical care time)  Medical Decision Making / ED Course   MDM:  38 year old male presenting to the emergency department with epigastric pain.  Patient well-appearing, physical exam with no tenderness.  Vital signs with initial mild tachycardia however not tachycardic on my evaluation.  Suspect gastritis.  He reports symptoms are consistent with previous episodes.  He has not been taking his Nexium  as prescribed.  He did report some vague shortness of breath but this is resolved and was in the setting of pain.  He is not hypoxic.  Chest x-ray is clear.  He has no chest pain.  His troponin is negative.  Low concern for any acute cardiopulmonary process.  His abdomen is benign, low concern for any other acute intra-abdominal process such as pancreatitis,  cholecystitis, appendicitis, volvulus, obstruction.  Will give dose of Maalox.  Instructed patient to continue his Nexium .  Advise tablets and care with primary doctor or gastroenterologist.  Labs only notable for very mild anemia which appears chronic and consistent with the patient's history of beta thalassemia.  Will discharge patient to home. All questions answered. Patient comfortable with plan of discharge. Return precautions discussed with patient and specified on the after visit summary.      Additional history obtained:  -External records from outside source obtained and reviewed including: Chart review including previous notes, labs, imaging, consultation notes including previous lab results    Lab Tests: -I ordered, reviewed, and interpreted labs.   The pertinent results include:   Labs Reviewed  CBC WITH DIFFERENTIAL/PLATELET - Abnormal; Notable for the following components:      Result Value   RBC 6.49 (*)    Hemoglobin 12.8 (*)    MCV 63.0 (*)    MCH 19.7 (*)    RDW 17.8 (*)    All other components within normal limits  LIPASE, BLOOD  COMPREHENSIVE METABOLIC PANEL  URINALYSIS, ROUTINE W REFLEX MICROSCOPIC  TROPONIN I (HIGH SENSITIVITY)    Notable for mild chronic anemia   EKG   EKG Interpretation Date/Time:  Saturday February 26 2023 13:44:34 EST Ventricular Rate:  97 PR Interval:  206 QRS Duration:  82 QT Interval:  328 QTC Calculation: 416 R Axis:   75  Text Interpretation: Normal sinus rhythm Anteroseptal infarct , age undetermined Abnormal ECG When compared with ECG of 16-Feb-2023 21:18, No significant change since last tracing Confirmed by Randol Simmonds [phone]) on 02/26/2023 1:47:15 PM         Imaging Studies ordered: I ordered imaging studies including CXR On my interpretation imaging demonstrates no acute process I independently visualized and interpreted imaging. I agree with the radiologist interpretation   Medicines ordered and prescription  drug management: Meds ordered this encounter  Medications   AND Linked Order Group    alum & mag hydroxide-simeth (MAALOX/MYLANTA) 200-200-20 MG/5ML suspension 30 mL    lidocaine  (XYLOCAINE ) 2 % viscous mouth solution 15 mL    -I have reviewed the patients home medicines and have made adjustments as needed Social Determinants of Health:  Diagnosis or treatment significantly limited by social determinants of health: obesity   Reevaluation: After the interventions noted above, I reevaluated the patient and found that their symptoms have improved  Co morbidities that complicate the patient evaluation  Past Medical History:  Diagnosis Date   Beta thalassemia (HCC)       Dispostion: Disposition decision including need for hospitalization was considered, and patient discharged from emergency department.    Final Clinical Impression(s) /  ED Diagnoses Final diagnoses:  Acute gastritis without hemorrhage, unspecified gastritis type     This chart was dictated using voice recognition software.  Despite best efforts to proofread,  errors can occur which can change the documentation meaning.    Francesca Elsie CROME, MD 02/26/23 [phone]

## 2023-02-26 NOTE — Discharge Instructions (Addendum)
 We evaluated you for your abdominal pain.  Your testing was reassuring.  Your symptoms are most likely due to inflammation in your stomach.  Please continue to take your Nexium .  If you have uncontrolled pain, you can buy Maalox or Mylanta over-the-counter and take this every 6 hours as needed.  Since you do not have a primary doctor, you can follow-up with [facility] or Dr. Cindie, a gastroenterologist in [name].  Please return to the emergency department if you develop any new or worsening symptoms such as severe worsening pain, fevers or chills, vomiting, black or bloody stools, or any other concerning symptoms.

## 2023-02-26 NOTE — ED Triage Notes (Signed)
 Pt with abd pain with SOB, feels like he is going to pass out starting this morning.  Denies any N/V/D, LBm this morning and normal per pt.

## 2023-02-27 ENCOUNTER — Emergency Department (HOSPITAL_COMMUNITY)
Admission: EM | Admit: 2023-02-27 | Discharge: 2023-02-27 | Disposition: A | Discharge status: Home / Self Care | Payer: BC Managed Care – PPO | Attending: Emergency Medicine | Admitting: Emergency Medicine

## 2023-02-27 ENCOUNTER — Other Ambulatory Visit: Payer: Self-pay

## 2023-02-27 DIAGNOSIS — R079 Chest pain, unspecified: Secondary | ICD-10-CM

## 2023-02-27 DIAGNOSIS — R519 Headache, unspecified: Secondary | ICD-10-CM | POA: Diagnosis not present

## 2023-02-27 DIAGNOSIS — R200 Anesthesia of skin: Secondary | ICD-10-CM | POA: Insufficient documentation

## 2023-02-27 DIAGNOSIS — R0789 Other chest pain: Secondary | ICD-10-CM | POA: Insufficient documentation

## 2023-02-27 DIAGNOSIS — R0602 Shortness of breath: Secondary | ICD-10-CM | POA: Diagnosis not present

## 2023-02-27 NOTE — ED Provider Notes (Signed)
 [place] EMERGENCY DEPARTMENT AT [facility] Provider Note   CSN: [id number] Arrival date & time: 02/27/23  0900     History  Chief Complaint  Patient presents with   Shortness of Breath    Omar Norris is a 38 y.o. male.   Shortness of Breath Patient with shortness of breath and chest pain.  Had episode yesterday.  Had been worsened after eating.  Felt better after treatment in the ER.  Has had GI treatment.  States today he developed more symptoms.  His left chest.  Does have some shortness of breath and some tightness.  Also some numbness in the ulnar aspect of his right hand.  Dull headache.  No fevers.  No definite sick contacts.    Past Medical History:  Diagnosis Date   Beta thalassemia (HCC)     Home Medications Prior to Admission medications   Medication Sig Start Date End Date Taking? Authorizing Provider  amoxicillin -clavulanate (AUGMENTIN ) 875-125 MG tablet Take 1 tablet by mouth every 12 (twelve) hours. 01/26/23   Midge Golas, MD      Allergies    Patient has no known allergies.    Review of Systems   Review of Systems  Respiratory:  Positive for shortness of breath.     Physical Exam Updated Vital Signs BP (!) 141/87 (BP Location: Right Arm)   Pulse 72   Temp 98.7 F (37.1 C) (Oral)   SpO2 98%  Physical Exam Vitals and nursing note reviewed.  Pulmonary:     Breath sounds: No decreased breath sounds or wheezing.  Chest:     Chest wall: No tenderness.  Musculoskeletal:     Right lower leg: No edema.     Left lower leg: No edema.  Skin:    Capillary Refill: Capillary refill takes less than 2 seconds.  Neurological:     Mental Status: He is alert.     Comments: Strength and sensation grossly intact.  No numbness or weakness.     ED Results / Procedures / Treatments   Labs (all labs ordered are listed, but only abnormal results are displayed) Labs Reviewed - No data to display  EKG None  Radiology DG Chest 2  View Result Date: 02/26/2023 CLINICAL DATA:  Shortness of breath. EXAM: CHEST - 2 VIEW COMPARISON:  Radiographs 01/12/2022 and 01/25/2021. FINDINGS: The heart size and mediastinal contours are normal. The lungs are clear. There is no pleural effusion or pneumothorax. No acute osseous findings are identified. IMPRESSION: No evidence of acute cardiopulmonary process. Electronically Signed   By: Elsie Perone M.D.   On: 02/26/2023 14:27    Procedures Procedures    Medications Ordered in ED Medications - No data to display  ED Course/ Medical Decision Making/ A&P                                 Medical Decision Making  Patient with left-sided chest pain.  Some shortness of breath.  Seen yesterday for abdominal pain and chest pain.  Workup at that time reassuring.  Given medicines for home.  Reviewed note.  Similar symptoms today although does have some more radiation to the left chest.  Will get EKG as a screening test.  His troponin was negative yesterday for now I do not think need to repeat blood work if EKG stable.  EKG stable prior.  Not think we need further workup this time.  Will discharge home.       Final Clinical Impression(s) / ED Diagnoses Final diagnoses:  Nonspecific chest pain    Rx / DC Orders ED Discharge Orders     None         Patsey Lot, MD 02/27/23 1212

## 2023-02-27 NOTE — ED Triage Notes (Signed)
 Pt c/o shob, CP L side as well as L pinky finger numb, states "feels like a pinched nerve", acid reflux and states he was seen here yesterday fro the same. Denies dizziness, endorses headache, rates pain 8/10 L chest

## 2023-02-27 NOTE — ED Provider Triage Note (Signed)
 Emergency Medicine Provider Triage Evaluation Note  Omar Norris , a 38 y.o. male  was evaluated in triage.  Pt complains of chest pain.  Review of Systems  Positive:  Negative:   Physical Exam  BP (!) 141/87 (BP Location: Right Arm)   Pulse 72   Temp 98.7 F (37.1 C) (Oral)   SpO2 98%  Gen:   Awake, no distress   Resp:  Normal effort  MSK:   Moves extremities without difficulty  Other:    Medical Decision Making  Medically screening exam initiated at 11:03 AM.  Appropriate orders placed.  Omar Norris was informed that the remainder of the evaluation will be completed by another provider, this initial triage assessment does not replace that evaluation, and the importance of remaining in the ED until their evaluation is complete.  Chest pain started yesterday. Chest pain gets worse with ambulation. Workup yesterday was reassuring. GI cocktail patient received yesterday provided relief for a couple of hours.    Hoy Fraction F, [place] 02/27/23 1106

## 2023-02-28 ENCOUNTER — Ambulatory Visit
Admission: EM | Admit: 2023-02-28 | Discharge: 2023-02-28 | Disposition: A | Discharge status: Home / Self Care | Payer: BC Managed Care – PPO | Attending: Nurse Practitioner | Admitting: Nurse Practitioner

## 2023-02-28 DIAGNOSIS — R1013 Epigastric pain: Secondary | ICD-10-CM

## 2023-02-28 DIAGNOSIS — K219 Gastro-esophageal reflux disease without esophagitis: Secondary | ICD-10-CM

## 2023-02-28 MED ORDER — LIDOCAINE VISCOUS HCL 2 % MT SOLN: 30.0000 mL | Freq: Two times a day (BID) | OROMUCOSAL | 0 refills | Status: DC | PRN

## 2023-02-28 MED ORDER — ESOMEPRAZOLE MAGNESIUM 40 MG PO CPDR: 40.0000 mg | DELAYED_RELEASE_CAPSULE | Freq: Every day | ORAL | 0 refills | Status: DC

## 2023-02-28 MED ORDER — ALUM & MAG HYDROXIDE-SIMETH 200-200-20 MG/5ML PO SUSP
30.0000 mL | Freq: Once | ORAL | Status: AC
  Administered 2023-02-28: 30 mL via ORAL

## 2023-02-28 NOTE — ED Triage Notes (Addendum)
 Pt reports epigastric discomfort, abdominal pain, SOB, states he was seen in ED on 02/26/2023 and 01/05/20245. Pt states he has been using Mylanta for sx's. Sx's started on Saturday

## 2023-02-28 NOTE — Discharge Instructions (Addendum)
 We gave you a dose of Maalox/Mylanta with simethicone  today and help with your abdominal pain.  Stop over-the-counter Nexium  and start prescription Nexium -make sure you take this on an empty stomach.  You can continue the GI cocktail medicine a couple of times daily as needed for abdominal pain.  Avoid foods that are high in acid.  If pain does not improve with treatment or if it worsens, please seek care.  If you choose to follow-up with a gastroenterologist, contact information has been provided.

## 2023-02-28 NOTE — ED Provider Notes (Signed)
 [facility]    CSN: [id number] Arrival date & time: 02/28/23  9046      History   Chief Complaint No chief complaint on file.   HPI Omar Norris is a 38 y.o. male.   Patient presents today with 3-day history of epigastric abdominal pain.  Reports the pain is constant and he describes the pain as a bloating and aching.  Pain is currently 7 out of 10.  Patient denies radiation of pain around to his back or down either leg.  No fever, nausea/vomiting, recent unexplained weight loss, change in appetite, diarrhea/constipation, blood in the stool, new rash, or urinary symptoms.  He endorses shortness of breath when the pain is severe and frequent burping/indigestion.  He has been seen in the emergency room twice for symptoms and was given a GI cocktail which did help with symptoms.  He has been taking over-the-counter antiacids with minimal improvement.  Denies NSAID use regularly.  Patient reports he has taken esomeprazole  in the past and started taking an over-the-counter version yesterday without improvement.     Past Medical History:  Diagnosis Date   Beta thalassemia [facility])     Patient Active Problem List   Diagnosis Date Noted   Obesity (BMI 30-39.9) 01/07/2020   Tobacco use 09/07/2019   Lipoma of forehead 09/19/2015   Sore throat 03/18/2012   Seasonal allergies 11/09/2011    History reviewed. No pertinent surgical history.     Home Medications    Prior to Admission medications   Medication Sig Start Date End Date Taking? Authorizing Provider  esomeprazole  (NEXIUM ) 40 MG capsule Take 1 capsule (40 mg total) by mouth daily. 02/28/23  Yes Chandra Harlene LABOR, NP  GI Cocktail (alum & mag hydroxide-simethicone /lidocaine )oral mixture Take 30 mLs by mouth 2 (two) times daily as needed. 02/28/23  Yes Chandra Harlene LABOR, NP    Family History Family History  Problem Relation Age of Onset   Hypertension Mother    Hyperlipidemia Mother     Social History Social  History   Tobacco Use   Smoking status: Former    Types: Cigars   Smokeless tobacco: Never   Tobacco comments:    weekends  Vaping Use   Vaping status: Former  Substance Use Topics   Alcohol use: Yes    Alcohol/week: 7.0 standard drinks of alcohol    Types: 2 Cans of beer, 5 Shots of liquor per week    Comment: Occas   Drug use: No     Allergies   Patient has no known allergies.   Review of Systems Review of Systems Per HPI  Physical Exam Triage Vital Signs ED Triage Vitals  Encounter Vitals Group     BP 02/28/23 1003 (!) 157/80     Systolic BP Percentile --      Diastolic BP Percentile --      Pulse Rate 02/28/23 1003 73     Resp 02/28/23 1003 17     Temp 02/28/23 1003 98.5 F (36.9 C)     Temp Source 02/28/23 1003 Oral     SpO2 02/28/23 1003 96 %     Weight --      Height --      Head Circumference --      Peak Flow --      Pain Score 02/28/23 1006 8     Pain Loc --      Pain Education --      Exclude from Growth Chart --  No data found.  Updated Vital Signs BP (!) 157/80 (BP Location: Right Arm)   Pulse 73   Temp 98.5 F (36.9 C) (Oral)   Resp 17   SpO2 96%   Visual Acuity Right Eye Distance:   Left Eye Distance:   Bilateral Distance:    Right Eye Near:   Left Eye Near:    Bilateral Near:     Physical Exam Vitals and nursing note reviewed.  Constitutional:      General: He is not in acute distress.    Appearance: Normal appearance. He is not toxic-appearing.  HENT:     Head: Normocephalic and atraumatic.     Mouth/Throat:     Mouth: Mucous membranes are moist.     Pharynx: Oropharynx is clear. No posterior oropharyngeal erythema.  Cardiovascular:     Rate and Rhythm: Normal rate and regular rhythm.  Pulmonary:     Effort: Pulmonary effort is normal. No respiratory distress.     Breath sounds: Normal breath sounds. No wheezing, rhonchi or rales.  Abdominal:     General: Abdomen is flat. Bowel sounds are normal. There is no  distension.     Palpations: Abdomen is soft.     Tenderness: There is generalized abdominal tenderness and tenderness in the epigastric area. There is no right CVA tenderness, left CVA tenderness, guarding or rebound. Negative signs include Murphy's sign, Rovsing's sign and McBurney's sign.  Musculoskeletal:     Cervical back: Normal range of motion.  Lymphadenopathy:     Cervical: No cervical adenopathy.  Skin:    General: Skin is warm and dry.     Capillary Refill: Capillary refill takes less than 2 seconds.     Coloration: Skin is not jaundiced or pale.     Findings: No erythema.  Neurological:     Mental Status: He is alert.     Motor: No weakness.     Gait: Gait normal.  Psychiatric:        Behavior: Behavior is cooperative.      UC Treatments / Results  Labs (all labs ordered are listed, but only abnormal results are displayed) Labs Reviewed - No data to display  EKG   Radiology   Procedures Procedures (including critical care time)  Medications Ordered in UC Medications  alum & mag hydroxide-simeth (MAALOX/MYLANTA) 200-200-20 MG/5ML suspension 30 mL (30 mLs Oral Given 02/28/23 1037)    Initial Impression / Assessment and Plan / UC Course  I have reviewed the triage vital signs and the nursing notes.  Pertinent labs & imaging results that were available during my care of the patient were reviewed by me and considered in my medical decision making (see chart for details).   Patient is well-appearing, normotensive, afebrile, not tachycardic, not tachypneic, oxygenating well on room air.    1. Abdominal pain, epigastric 2. Gastroesophageal reflux disease without esophagitis Patient denies any shortness of breath today while in urgent care Overall, vitals and exam are stable GI cocktail was given with improvement in pain from 7 out of 10 to 4 out of 10 Continue avoidance of trigger foods, start esomeprazole  40 mg daily on an empty stomach Continue antacids/gas  relief and meantime as needed twice daily Strict ER precautions discussed with patient Follow-up with GI if symptoms do not improve with treatment  The patient was given the opportunity to ask questions.  All questions answered to their satisfaction.  The patient is in agreement to this plan.    Final Clinical  Impressions(s) / UC Diagnoses   Final diagnoses:  Abdominal pain, epigastric  Gastroesophageal reflux disease without esophagitis     Discharge Instructions      We gave you a dose of Maalox/Mylanta with simethicone  today and help with your abdominal pain.  Stop over-the-counter Nexium  and start prescription Nexium -make sure you take this on an empty stomach.  You can continue the GI cocktail medicine a couple of times daily as needed for abdominal pain.  Avoid foods that are high in acid.  If pain does not improve with treatment or if it worsens, please seek care.  If you choose to follow-up with a gastroenterologist, contact information has been provided.     ED Prescriptions     Medication Sig Dispense Auth. Provider   esomeprazole  (NEXIUM ) 40 MG capsule Take 1 capsule (40 mg total) by mouth daily. 30 capsule Chandra Raisin A, NP   GI Cocktail (alum & mag hydroxide-simethicone /lidocaine )oral mixture Take 30 mLs by mouth 2 (two) times daily as needed. 180 mL Chandra Raisin LABOR, NP      PDMP not reviewed this encounter.   Chandra Raisin LABOR, NP 02/28/23 1347

## 2023-03-24 ENCOUNTER — Ambulatory Visit
Admission: EM | Admit: 2023-03-24 | Discharge: 2023-03-24 | Disposition: A | Discharge status: Home / Self Care | Payer: BC Managed Care – PPO | Attending: Family Medicine | Admitting: Family Medicine

## 2023-03-24 DIAGNOSIS — R1032 Left lower quadrant pain: Secondary | ICD-10-CM | POA: Diagnosis not present

## 2023-03-24 DIAGNOSIS — N4889 Other specified disorders of penis: Secondary | ICD-10-CM | POA: Diagnosis not present

## 2023-03-24 LAB — POCT URINALYSIS DIP (MANUAL ENTRY)
Bilirubin, UA: NEGATIVE
Blood, UA: NEGATIVE
Glucose, UA: NEGATIVE mg/dL
Leukocytes, UA: NEGATIVE
Nitrite, UA: NEGATIVE
Protein Ur, POC: 30 mg/dL — AB
Spec Grav, UA: >=1.03 — AB (ref 1.010–1.025)
Urobilinogen, UA: 1 U/dL
pH, UA: 6 (ref 5.0–8.0)

## 2023-03-24 MED ORDER — CYCLOBENZAPRINE HCL 10 MG PO TABS
10.0000 mg | ORAL_TABLET | Freq: Three times a day (TID) | ORAL | 0 refills | Status: DC | PRN
Start: 2023-03-24 — End: 2023-04-06

## 2023-03-24 NOTE — Discharge Instructions (Signed)
You may use heat, over-the-counter pain relievers, muscle relaxer for your groin pain and back pain.  I suspect they are muscular in nature but we have also sent out a STD swab to ensure no infectious cause.  We will let you know if anything comes back positive on this.  Follow-up for worsening symptoms.  To establish with a new primary care provider, you may go to the [facility] health website and click on the find a provider tab.  From here you can choose someone based on your preferences and schedule your appointment online

## 2023-03-24 NOTE — ED Triage Notes (Signed)
Pt reports he is having groin pain that radiates down his left leg and low back x 4 days   Hurts to walk   States he has had unprotected intercourse within the last 4 weeks.

## 2023-03-24 NOTE — ED Provider Notes (Signed)
[facility]    CSN: [id number] Arrival date & time: 03/24/23  1419      History   Chief Complaint No chief complaint on file.   HPI Omar Norris is a 38 y.o. male.   Patient presenting today with 4-day history of left groin pain radiating down the inner leg and up to her low back.  States it is worse with walking or movement.  Denies scrotal edema, dysuria, penile discharge, fever, chills, known exposure to STIs.  Also has some small flesh-colored bumps to the head of his penis but states they are almost fully resolved after putting some over-the-counter creams.  Of note, just started lifting weights on Friday just prior to onset of symptoms.  Has had unprotected intercourse over the last month.    Past Medical History:  Diagnosis Date   Beta thalassemia [facility])     Patient Active Problem List   Diagnosis Date Noted   Obesity (BMI 30-39.9) 01/07/2020   Tobacco use 09/07/2019   Lipoma of forehead 09/19/2015   Sore throat 03/18/2012   Seasonal allergies 11/09/2011    History reviewed. No pertinent surgical history.     Home Medications    Prior to Admission medications   Medication Sig Start Date End Date Taking? Authorizing Provider  cyclobenzaprine (FLEXERIL) 10 MG tablet Take 1 tablet (10 mg total) by mouth 3 (three) times daily as needed for muscle spasms. Do not drink alcohol or drive while taking this medication. May cause drowsiness 03/24/23  Yes Particia Nearing, PA-C  esomeprazole (NEXIUM) 40 MG capsule Take 1 capsule (40 mg total) by mouth daily. 02/28/23   Valentino Nose, NP  GI Cocktail (alum & mag hydroxide-simethicone/lidocaine)oral mixture Take 30 mLs by mouth 2 (two) times daily as needed. 02/28/23   Valentino Nose, NP    Family History Family History  Problem Relation Age of Onset   Hypertension Mother    Hyperlipidemia Mother     Social History Social History   Tobacco Use   Smoking status: Former    Types: Cigars    Smokeless tobacco: Never   Tobacco comments:    weekends  Vaping Use   Vaping status: Former  Substance Use Topics   Alcohol use: Yes    Alcohol/week: 7.0 standard drinks of alcohol    Types: 2 Cans of beer, 5 Shots of liquor per week    Comment: Occas   Drug use: No    Allergies   Patient has no known allergies.   Review of Systems Review of Systems PER HPI  Physical Exam Triage Vital Signs ED Triage Vitals  Encounter Vitals Group     BP 03/24/23 1536 (!) 150/88     Systolic BP Percentile --      Diastolic BP Percentile --      Pulse Rate 03/24/23 1536 89     Resp 03/24/23 1536 14     Temp 03/24/23 1536 98.5 F (36.9 C)     Temp Source 03/24/23 1536 Oral     SpO2 03/24/23 1536 97 %     Weight --      Height --      Head Circumference --      Peak Flow --      Pain Score 03/24/23 1537 6     Pain Loc --      Pain Education --      Exclude from Growth Chart --    No data found.  Updated  Vital Signs BP (!) 150/88 (BP Location: Right Arm)   Pulse 89   Temp 98.5 F (36.9 C) (Oral)   Resp 14   SpO2 97%   Visual Acuity Right Eye Distance:   Left Eye Distance:   Bilateral Distance:    Right Eye Near:   Left Eye Near:    Bilateral Near:     Physical Exam Vitals and nursing note reviewed. Exam conducted with a chaperone present.  Constitutional:      Appearance: Normal appearance.  HENT:     Head: Atraumatic.  Eyes:     Extraocular Movements: Extraocular movements intact.     Conjunctiva/sclera: Conjunctivae normal.  Cardiovascular:     Rate and Rhythm: Normal rate and regular rhythm.  Pulmonary:     Effort: Pulmonary effort is normal.     Breath sounds: Normal breath sounds.  Genitourinary:    Comments: No testicular nodules palpable bilaterally, no scrotal edema or discoloration, several Fordyce spots present to shaft of penis Musculoskeletal:        General: Tenderness present. Normal range of motion.     Cervical back: Normal range of motion  and neck supple.     Comments: Tender to palpation to the left groin musculature extending into medial quadricep  Skin:    General: Skin is warm and dry.  Neurological:     General: No focal deficit present.     Mental Status: He is oriented to person, place, and time.     Motor: No weakness.     Gait: Gait normal.  Psychiatric:        Mood and Affect: Mood normal.        Thought Content: Thought content normal.        Judgment: Judgment normal.      UC Treatments / Results  Labs (all labs ordered are listed, but only abnormal results are displayed) Labs Reviewed  POCT URINALYSIS DIP (MANUAL ENTRY) - Abnormal; Notable for the following components:      Result Value   Color, UA straw (*)    Ketones, POC UA trace (5) (*)    Spec Grav, UA >=1.030 (*)    Protein Ur, POC =30 (*)    All other components within normal limits  CYTOLOGY, (ORAL, ANAL, URETHRAL) ANCILLARY ONLY    EKG   Radiology No results found.  Procedures Procedures (including critical care time)  Medications Ordered in UC Medications - No data to display  Initial Impression / Assessment and Plan / UC Course  I have reviewed the triage vital signs and the nursing notes.  Pertinent labs & imaging results that were available during my care of the patient were reviewed by me and considered in my medical decision making (see chart for details).     Suspect muscular pain.  Urinalysis without evidence of urinary tract infection, STI testing pending for further evaluation.  Treat with Flexeril, heat, massage, stretches and return for worsening symptoms.  Suspect the bumps are Fordyce spots and discussed benign nature of this.  Aquaphor or Vaseline if irritated.  Final Clinical Impressions(s) / UC Diagnoses   Final diagnoses:  Left groin pain  Penile irritation     Discharge Instructions      You may use heat, over-the-counter pain relievers, muscle relaxer for your groin pain and back pain.  I suspect  they are muscular in nature but we have also sent out a STD swab to ensure no infectious cause.  We will let you  know if anything comes back positive on this.  Follow-up for worsening symptoms.  To establish with a new primary care provider, you may go to the [facility] health website and click on the find a provider tab.  From here you can choose someone based on your preferences and schedule your appointment online    ED Prescriptions     Medication Sig Dispense Auth. Provider   cyclobenzaprine (FLEXERIL) 10 MG tablet Take 1 tablet (10 mg total) by mouth 3 (three) times daily as needed for muscle spasms. Do not drink alcohol or drive while taking this medication. May cause drowsiness 15 tablet Particia Nearing, [place]      PDMP not reviewed this encounter.   Particia Nearing, [place] 03/24/23 [phone]

## 2023-03-25 LAB — CYTOLOGY, (ORAL, ANAL, URETHRAL) ANCILLARY ONLY
Chlamydia: NEGATIVE
Comment: NEGATIVE
Comment: NEGATIVE
Comment: NORMAL
Neisseria Gonorrhea: NEGATIVE
Trichomonas: NEGATIVE

## 2023-04-06 ENCOUNTER — Ambulatory Visit (INDEPENDENT_AMBULATORY_CARE_PROVIDER_SITE_OTHER): Payer: BC Managed Care – PPO | Admitting: Internal Medicine

## 2023-04-06 ENCOUNTER — Encounter: Payer: Self-pay | Admitting: Internal Medicine

## 2023-04-06 VITALS — BP 132/86 | HR 75 | Ht 69.0 in | Wt 222.0 lb

## 2023-04-06 DIAGNOSIS — E611 Iron deficiency: Secondary | ICD-10-CM | POA: Diagnosis not present

## 2023-04-06 DIAGNOSIS — R1032 Left lower quadrant pain: Secondary | ICD-10-CM | POA: Insufficient documentation

## 2023-04-06 DIAGNOSIS — E785 Hyperlipidemia, unspecified: Secondary | ICD-10-CM | POA: Diagnosis not present

## 2023-04-06 DIAGNOSIS — Z23 Encounter for immunization: Secondary | ICD-10-CM

## 2023-04-06 DIAGNOSIS — E7849 Other hyperlipidemia: Secondary | ICD-10-CM | POA: Diagnosis not present

## 2023-04-06 DIAGNOSIS — D509 Iron deficiency anemia, unspecified: Secondary | ICD-10-CM | POA: Insufficient documentation

## 2023-04-06 DIAGNOSIS — R739 Hyperglycemia, unspecified: Secondary | ICD-10-CM | POA: Diagnosis not present

## 2023-04-06 DIAGNOSIS — E782 Mixed hyperlipidemia: Secondary | ICD-10-CM | POA: Diagnosis not present

## 2023-04-06 DIAGNOSIS — Z113 Encounter for screening for infections with a predominantly sexual mode of transmission: Secondary | ICD-10-CM | POA: Insufficient documentation

## 2023-04-06 DIAGNOSIS — K219 Gastro-esophageal reflux disease without esophagitis: Secondary | ICD-10-CM | POA: Insufficient documentation

## 2023-04-06 DIAGNOSIS — M5442 Lumbago with sciatica, left side: Secondary | ICD-10-CM | POA: Diagnosis not present

## 2023-04-06 DIAGNOSIS — M5441 Lumbago with sciatica, right side: Secondary | ICD-10-CM | POA: Insufficient documentation

## 2023-04-06 MED ORDER — CYCLOBENZAPRINE HCL 10 MG PO TABS
10.0000 mg | ORAL_TABLET | Freq: Every day | ORAL | 0 refills | Status: AC
Start: 2023-04-06 — End: ?

## 2023-04-06 MED ORDER — PREDNISONE 20 MG PO TABS: 40.0000 mg | ORAL_TABLET | Freq: Every day | ORAL | 0 refills | Status: DC

## 2023-04-06 MED ORDER — ESOMEPRAZOLE MAGNESIUM 40 MG PO CPDR: 40.0000 mg | DELAYED_RELEASE_CAPSULE | Freq: Every day | ORAL | 5 refills | Status: DC

## 2023-04-06 MED ORDER — PREDNISONE 20 MG PO TABS: 20.0000 mg | ORAL_TABLET | Freq: Every day | ORAL | 0 refills | Status: DC

## 2023-04-06 NOTE — Assessment & Plan Note (Signed)
Likely due to heavy lifting and prolonged sitting Advised to avoid heavy lifting and frequent bending Advised to use back brace and seat cushion for better support Prednisone 40 mg once daily X 5 days Flexeril as needed for back muscle spasms, advised to avoid driving at least for 8 hours after taking it Heating pad PRN

## 2023-04-06 NOTE — Assessment & Plan Note (Signed)
He is concerned about STD, recent urine test negative for chlamydia and gonorrhea - check HIV and hep C testing Needs to use barrier contraceptive regularly

## 2023-04-06 NOTE — Assessment & Plan Note (Signed)
Likely musculoskeletal in etiology -radicular pain from lumbar spine area He has had evaluation for left testicular pain in the past - US scrotum with Doppler was benign

## 2023-04-06 NOTE — Patient Instructions (Signed)
Please take Prednisone as prescribed. Please take half tablet of Flexeril as needed for back muscle spasms.  Please avoid heavy lifting and frequent bending for at least 2-3 weeks.  Okay to apply heating pad and/or back brace for back discomfort.

## 2023-04-06 NOTE — Assessment & Plan Note (Addendum)
Last CBC showed Hb of 12.8 Denies any signs of active bleeding Check CBC and iron profile Chart review suggests history of beta thalassemia, but he is unaware of it, does not have family history of thalassemia - no record of hemoglobin electrophoresis done

## 2023-04-06 NOTE — Assessment & Plan Note (Signed)
Well controlled with Nexium 40 mg QD, refilled

## 2023-04-06 NOTE — Progress Notes (Signed)
New Patient Office Visit  Subjective:  Patient ID: MACALLAN ORD, male    DOB: 12-09-1985  Age: 38 y.o. MRN: [id number]  CC:  Chief Complaint  Patient presents with   Establish Care    New patient, pt reports groin pain, ongoing since last week, was seen by urgent care. Would like to do blood work.     HPI Omar Norris is a 38 y.o. male with past medical history of GERD who presents for establishing care.  He recently went to urgent care for acute onset low back pain with radicular pain towards left > right testicular and groin area since 03/20/23.  His pain is intermittent, sharp, radiating towards R > L LE.  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  He also recently started heavy lifting at the gym.  He was given Flexeril from urgent care, but he preferred to take ibuprofen, which has helped with pain somewhat.  Denies numbness or tingling of the LE, urinary or stool incontinence.  He denies any dysuria, penile discharge, fever, chills or known exposure to STIs.  He reports that his girlfriend was sexually active with other person, and he requested STD screening.  He had negative chlamydia and gonorrhea testing during her urgent care visit.  His UA was negative for UTI.  He takes Nexium 40 mg QD for GERD.  Denies nausea or vomiting currently.  He had epigastric pain before starting Nexium.  History reviewed. No pertinent past medical history.   History reviewed. No pertinent surgical history.  Family History  Problem Relation Age of Onset   Hypertension Mother    Hyperlipidemia Mother     Social History   Socioeconomic History   Marital status: Single    Spouse name: Not on file   Number of children: Not on file   Years of education: Not on file   Highest education level: Not on file  Occupational History   Not on file  Tobacco Use   Smoking status: Every Day    Types: Cigars   Smokeless tobacco: Never   Tobacco  comments:    weekends  Vaping Use   Vaping status: Former  Substance and Sexual Activity   Alcohol use: Yes    Alcohol/week: 7.0 standard drinks of alcohol    Types: 2 Cans of beer, 5 Shots of liquor per week    Comment: Occas   Drug use: No   Sexual activity: Yes    Birth control/protection: Condom  Other Topics Concern   Not on file  Social History Narrative   Not on file   Social Drivers of Health   Financial Resource Strain: Not on file  Food Insecurity: Not on file  Transportation Needs: Not on file  Physical Activity: Not on file  Stress: Not on file  Social Connections: Unknown (06/25/2021)   Received from [facility], [facility]   Social Network    Social Network: Not on file  Intimate Partner Violence: Unknown (05/28/2021)   Received from [facility], [facility]   HITS    Physically Hurt: Not on file    Insult or Talk Down To: Not on file    Threaten Physical Harm: Not on file    Scream or Curse: Not on file    ROS Review of Systems  Constitutional:  Negative for chills and fever.  HENT:  Negative for congestion and sore throat.   Eyes:  Negative  for pain and discharge.  Respiratory:  Negative for cough and shortness of breath.   Cardiovascular:  Negative for chest pain and palpitations.  Gastrointestinal:  Negative for constipation, diarrhea, nausea and vomiting.  Endocrine: Negative for polydipsia and polyuria.  Genitourinary:  Positive for testicular pain (Left). Negative for dysuria, hematuria, penile discharge, penile pain and scrotal swelling.  Musculoskeletal:  Positive for back pain. Negative for neck pain and neck stiffness.  Skin:  Negative for rash.  Neurological:  Negative for dizziness, weakness, numbness and headaches.  Psychiatric/Behavioral:  Negative for agitation and behavioral problems.     Objective:   Today's Vitals: BP 132/86 (BP Location: Left Arm)   Pulse 75   Ht 5\' 9"  (1.753 m)   Wt 222 lb (100.7 kg)   SpO2 98%   BMI  32.78 kg/m   Physical Exam Vitals reviewed.  Constitutional:      General: He is not in acute distress.    Appearance: He is obese. He is not diaphoretic.  HENT:     Head: Normocephalic and atraumatic.     Nose: Nose normal.     Mouth/Throat:     Mouth: Mucous membranes are moist.  Eyes:     General: No scleral icterus.    Extraocular Movements: Extraocular movements intact.  Cardiovascular:     Rate and Rhythm: Normal rate and regular rhythm.     Heart sounds: Normal heart sounds. No murmur heard. Pulmonary:     Breath sounds: Normal breath sounds. No wheezing or rales.  Genitourinary:    Testes:        Right: Tenderness or testicular hydrocele not present.        Left: Tenderness or testicular hydrocele not present.  Musculoskeletal:     Cervical back: Neck supple. No tenderness.     Lumbar back: Tenderness (Paraspinal) present. Normal range of motion.     Right lower leg: No edema.     Left lower leg: No edema.  Skin:    General: Skin is warm.     Findings: No rash.  Neurological:     General: No focal deficit present.     Mental Status: He is alert and oriented to person, place, and time.  Psychiatric:        Mood and Affect: Mood normal.        Behavior: Behavior normal.     Assessment & Plan:   Problem List Items Addressed This Visit       Digestive   Gastroesophageal reflux disease - Primary   Well controlled with Nexium 40 mg QD, refilled      Relevant Medications   esomeprazole (NEXIUM) 40 MG capsule     Nervous and Auditory   Acute bilateral low back pain with bilateral sciatica   Likely due to heavy lifting and prolonged sitting Advised to avoid heavy lifting and frequent bending Advised to use back brace and seat cushion for better support Prednisone 40 mg once daily X 5 days Flexeril as needed for back muscle spasms, advised to avoid driving at least for 8 hours after taking it Heating pad PRN      Relevant Medications   cyclobenzaprine  (FLEXERIL) 10 MG tablet   predniSONE (DELTASONE) 20 MG tablet     Other   Groin pain, left   Likely musculoskeletal in etiology -radicular pain from lumbar spine area He has had evaluation for left testicular pain in the past - US scrotum with Doppler was benign  Relevant Medications   cyclobenzaprine (FLEXERIL) 10 MG tablet   predniSONE (DELTASONE) 20 MG tablet   Screen for STD (sexually transmitted disease)   He is concerned about STD, recent urine test negative for chlamydia and gonorrhea - check HIV and hep C testing Needs to use barrier contraceptive regularly      Relevant Orders   HIV antibody (with reflex)   Hepatitis C Antibody   Microcytic anemia   Last CBC showed Hb of 12.8 Denies any signs of active bleeding Check CBC and iron profile Chart review suggests history of beta thalassemia, but he is unaware of it, does not have family history of thalassemia - no record of hemoglobin electrophoresis done      Relevant Orders   CBC with Differential/Platelet   Fe+TIBC+Fer   Other Visit Diagnoses       Mixed hyperlipidemia       Relevant Orders   Lipid Profile     Hyperglycemia       Relevant Orders   CMP14+EGFR   Hemoglobin A1c     Encounter for immunization       Relevant Orders   Flu vaccine trivalent PF, 6mos and older(Flulaval,Afluria,Fluarix,Fluzone) (Completed)       Outpatient Encounter Medications as of 04/06/2023  Medication Sig   [DISCONTINUED] predniSONE (DELTASONE) 20 MG tablet Take 1 tablet (20 mg total) by mouth daily with breakfast.   cyclobenzaprine (FLEXERIL) 10 MG tablet Take 1 tablet (10 mg total) by mouth at bedtime. Do not drink alcohol or drive while taking this medication. May cause drowsiness   esomeprazole (NEXIUM) 40 MG capsule Take 1 capsule (40 mg total) by mouth daily.   predniSONE (DELTASONE) 20 MG tablet Take 2 tablets (40 mg total) by mouth daily with breakfast.   [DISCONTINUED] cyclobenzaprine (FLEXERIL) 10 MG tablet Take 1  tablet (10 mg total) by mouth 3 (three) times daily as needed for muscle spasms. Do not drink alcohol or drive while taking this medication. May cause drowsiness   [DISCONTINUED] esomeprazole (NEXIUM) 40 MG capsule Take 1 capsule (40 mg total) by mouth daily. (Patient not taking: Reported on 04/06/2023)   [DISCONTINUED] GI Cocktail (alum & mag hydroxide-simethicone/lidocaine)oral mixture Take 30 mLs by mouth 2 (two) times daily as needed.   No facility-administered encounter medications on file as of 04/06/2023.    Follow-up: Return in about 3 months (around 07/04/2023) for GERD and back pain.   Anabel Halon, MD

## 2023-04-07 LAB — CMP14+EGFR
ALT: 33 [IU]/L (ref 0–44)
AST: 20 [IU]/L (ref 0–40)
Albumin: 4.4 g/dL (ref 4.1–5.1)
Alkaline Phosphatase: 134 [IU]/L — ABNORMAL HIGH (ref 44–121)
BUN/Creatinine Ratio: 10 (ref 9–20)
BUN: 11 mg/dL (ref 6–24)
Bilirubin Total: 0.9 mg/dL (ref 0.0–1.2)
CO2: 20 mmol/L (ref 20–29)
Calcium: 9.6 mg/dL (ref 8.7–10.2)
Chloride: 99 mmol/L (ref 96–106)
Creatinine, Ser: 1.08 mg/dL (ref 0.76–1.27)
Globulin, Total: 2.2 g/dL (ref 1.5–4.5)
Glucose: 91 mg/dL (ref 70–99)
Potassium: 4.5 mmol/L (ref 3.5–5.2)
Sodium: 136 mmol/L (ref 134–144)
Total Protein: 6.6 g/dL (ref 6.0–8.5)
eGFR: 89 mL/min/{1.73_m2} (ref >59)

## 2023-04-07 LAB — HIV ANTIBODY (ROUTINE TESTING W REFLEX): HIV Screen 4th Generation wRfx: NONREACTIVE

## 2023-04-07 LAB — CBC WITH DIFFERENTIAL/PLATELET
Basophils Absolute: 0 10*3/uL (ref 0.0–0.2)
Basos: 0 %
EOS (ABSOLUTE): 0.1 10*3/uL (ref 0.0–0.4)
Eos: 1 %
Hematocrit: 43.8 % (ref 37.5–51.0)
Hemoglobin: 13.2 g/dL (ref 13.0–17.7)
Immature Grans (Abs): 0 10*3/uL (ref 0.0–0.1)
Immature Granulocytes: 0 %
Lymphocytes Absolute: 1.6 10*3/uL (ref 0.7–3.1)
Lymphs: 22 %
MCH: 19.6 pg — ABNORMAL LOW (ref 26.6–33.0)
MCHC: 30.1 g/dL — ABNORMAL LOW (ref 31.5–35.7)
MCV: 65 fL — ABNORMAL LOW (ref 79–97)
Monocytes Absolute: 0.5 10*3/uL (ref 0.1–0.9)
Monocytes: 7 %
Neutrophils Absolute: 5.1 10*3/uL (ref 1.4–7.0)
Neutrophils: 70 %
Platelets: 143 10*3/uL — ABNORMAL LOW (ref 150–450)
RBC: 6.74 x10E6/uL — ABNORMAL HIGH (ref 4.14–5.80)
RDW: 18.5 % — ABNORMAL HIGH (ref 11.6–15.4)
WBC: 7.4 10*3/uL (ref 3.4–10.8)

## 2023-04-07 LAB — LIPID PANEL
Chol/HDL Ratio: 2 {ratio} (ref 0.0–5.0)
Cholesterol, Total: 141 mg/dL (ref 100–199)
HDL: 70 mg/dL (ref >39)
LDL Chol Calc (NIH): 61 mg/dL (ref 0–99)
Triglycerides: 42 mg/dL (ref 0–149)
VLDL Cholesterol Cal: 10 mg/dL (ref 5–40)

## 2023-04-07 LAB — HEMOGLOBIN A1C
Est. average glucose Bld gHb Est-mCnc: 105 mg/dL
Hgb A1c MFr Bld: 5.3 % (ref 4.8–5.6)

## 2023-04-07 LAB — IRON,TIBC AND FERRITIN PANEL
Ferritin: 116 ng/mL (ref 30–400)
Iron Saturation: 22 % (ref 15–55)
Iron: 64 ug/dL (ref 38–169)
Total Iron Binding Capacity: 290 ug/dL (ref 250–450)
UIBC: 226 ug/dL (ref 111–343)

## 2023-04-07 LAB — HEPATITIS C ANTIBODY: Hep C Virus Ab: NONREACTIVE

## 2023-06-29 ENCOUNTER — Encounter (HOSPITAL_COMMUNITY): Payer: Self-pay

## 2023-07-06 ENCOUNTER — Ambulatory Visit: Payer: BC Managed Care – PPO | Admitting: Internal Medicine

## 2023-07-13 ENCOUNTER — Encounter: Payer: Self-pay | Admitting: Internal Medicine

## 2023-09-23 ENCOUNTER — Other Ambulatory Visit: Payer: Self-pay

## 2023-09-23 ENCOUNTER — Encounter (HOSPITAL_COMMUNITY): Payer: Self-pay

## 2023-09-23 ENCOUNTER — Emergency Department (HOSPITAL_COMMUNITY)
Admission: EM | Admit: 2023-09-23 | Discharge: 2023-09-23 | Disposition: A | Discharge status: Home / Self Care | Attending: Emergency Medicine | Admitting: Emergency Medicine

## 2023-09-23 DIAGNOSIS — R1013 Epigastric pain: Secondary | ICD-10-CM | POA: Insufficient documentation

## 2023-09-23 DIAGNOSIS — R799 Abnormal finding of blood chemistry, unspecified: Secondary | ICD-10-CM | POA: Diagnosis not present

## 2023-09-23 DIAGNOSIS — K219 Gastro-esophageal reflux disease without esophagitis: Secondary | ICD-10-CM

## 2023-09-23 HISTORY — DX: Gastro-esophageal reflux disease without esophagitis: K21.9

## 2023-09-23 LAB — CBC WITH DIFFERENTIAL/PLATELET
Abs Immature Granulocytes: 0.02 K/uL (ref 0.00–0.07)
Basophils Absolute: 0 K/uL (ref 0.0–0.1)
Basophils Relative: 0 %
Eosinophils Absolute: 0.1 K/uL (ref 0.0–0.5)
Eosinophils Relative: 2 %
HCT: 42.6 % (ref 39.0–52.0)
Hemoglobin: 13.1 g/dL (ref 13.0–17.0)
Immature Granulocytes: 0 %
Lymphocytes Relative: 30 %
Lymphs Abs: 2 K/uL (ref 0.7–4.0)
MCH: 19.6 pg — ABNORMAL LOW (ref 26.0–34.0)
MCHC: 30.8 g/dL (ref 30.0–36.0)
MCV: 63.9 fL — ABNORMAL LOW (ref 80.0–100.0)
Monocytes Absolute: 0.5 K/uL (ref 0.1–1.0)
Monocytes Relative: 8 %
Neutro Abs: 4.1 K/uL (ref 1.7–7.7)
Neutrophils Relative %: 60 %
Platelets: 247 K/uL (ref 150–400)
RBC: 6.67 MIL/uL — ABNORMAL HIGH (ref 4.22–5.81)
RDW: 17.7 % — ABNORMAL HIGH (ref 11.5–15.5)
Smear Review: NORMAL
WBC: 6.8 K/uL (ref 4.0–10.5)
nRBC: 0 % (ref 0.0–0.2)

## 2023-09-23 LAB — COMPREHENSIVE METABOLIC PANEL WITH GFR
ALT: 57 U/L — ABNORMAL HIGH (ref 0–44)
AST: 32 U/L (ref 15–41)
Albumin: 4.2 g/dL (ref 3.5–5.0)
Alkaline Phosphatase: 120 U/L (ref 38–126)
Anion gap: 9 (ref 5–15)
BUN: 11 mg/dL (ref 6–20)
CO2: 28 mmol/L (ref 22–32)
Calcium: 8.9 mg/dL (ref 8.9–10.3)
Chloride: 104 mmol/L (ref 98–111)
Creatinine, Ser: 0.96 mg/dL (ref 0.61–1.24)
GFR, Estimated: >60 mL/min (ref >60)
Glucose, Bld: 83 mg/dL (ref 70–99)
Potassium: 3.7 mmol/L (ref 3.5–5.1)
Sodium: 141 mmol/L (ref 135–145)
Total Bilirubin: 1 mg/dL (ref 0.0–1.2)
Total Protein: 7.2 g/dL (ref 6.5–8.1)

## 2023-09-23 LAB — URINALYSIS, ROUTINE W REFLEX MICROSCOPIC
Bilirubin Urine: NEGATIVE
Glucose, UA: NEGATIVE mg/dL
Hgb urine dipstick: NEGATIVE
Ketones, ur: NEGATIVE mg/dL
Leukocytes,Ua: NEGATIVE
Nitrite: NEGATIVE
Protein, ur: NEGATIVE mg/dL
Specific Gravity, Urine: 1.028 (ref 1.005–1.030)
pH: 6 (ref 5.0–8.0)

## 2023-09-23 LAB — LIPASE, BLOOD: Lipase: 39 U/L (ref 11–51)

## 2023-09-23 MED ORDER — FAMOTIDINE 20 MG PO TABS
20.0000 mg | ORAL_TABLET | Freq: Once | ORAL | Status: AC
  Administered 2023-09-23: 20 mg via ORAL
  Filled 2023-09-23: qty 1

## 2023-09-23 MED ORDER — ALUM & MAG HYDROXIDE-SIMETH 200-200-20 MG/5ML PO SUSP: 30.0000 mL | Freq: Once | ORAL | Status: DC

## 2023-09-23 MED ORDER — PANTOPRAZOLE SODIUM 40 MG PO TBEC
40.0000 mg | DELAYED_RELEASE_TABLET | Freq: Once | ORAL | Status: AC
  Administered 2023-09-23: 40 mg via ORAL
  Filled 2023-09-23: qty 1

## 2023-09-23 MED ORDER — ESOMEPRAZOLE MAGNESIUM 40 MG PO CPDR: 40.0000 mg | DELAYED_RELEASE_CAPSULE | Freq: Every day | ORAL | 5 refills | Status: AC

## 2023-09-23 MED ORDER — LIDOCAINE VISCOUS HCL 2 % MT SOLN: 15.0000 mL | Freq: Once | OROMUCOSAL | Status: DC

## 2023-09-23 NOTE — ED Triage Notes (Signed)
 Pt comes in with upper abd. Pain since Tuesday. Pt has been taking pepcid (40mg ) and Maalox. Pt states it helps some but does not go completely away. Pt has known acid reflux. Pt believes it all started when he ate pizza and wings Tuesday night. Pt also has diarrhea that started yesterday. A&Ox4.

## 2023-09-23 NOTE — Discharge Instructions (Addendum)
 You are seen in the ER today for symptoms of acid reflux and abdominal discomfort.  Fortunately your blood work was all fairly reassuring.  There is no sign of pancreatitis, your kidney and liver function were normal except for slight the elevated ALT which is a liver function test.  This was barely over the normal limit.  There is no sign of urinary tract infection.  Your blood counts did not show any significant anemia but your red blood cells looked abnormal under the microscope.  There are a lot of different causes of this, and you should follow-up with the hematologist and your primary care doctor for this.  You likely need more test, though not emergently.  I am going to restart your antacid medicine and have you follow-up with your PCP.  If you have new or worsening symptoms come back to the ER.  If you are still having frequent heartburn despite the medications, you need to see the GI doctor.  You were given their phone number as well to schedule appointment.

## 2023-09-23 NOTE — ED Provider Notes (Addendum)
 [place] EMERGENCY DEPARTMENT AT [facility] Provider Note   CSN: [id number] Arrival date & time: 09/23/23  1132     Patient presents with: Abdominal Pain and Gastroesophageal Reflux   Omar Norris is a 38 y.o. male.  History of GERD.  Presents the ER today for evaluation of epigastric and left lower quadrant left flank pain.  Patient reports he started having symptoms days ago after eating pizza.  He has been taking over-the-counter Maalox which he states eases the burning sensation in his upper abdomen that he routinely gets with his GERD but he still having pain in the left upper quadrant and left side which is new.  He states when he has had this before he was given a 40 mg tablet prescription but he states he is out of this.  He is not sure what it is, but records review shows he was prescribed Nexium  40 mg.  He has fever chills, denies urinary symptoms.  States he did have 1 loose brown stool this morning but no other diarrhea, no constipation, no hematochezia or melena.  He does not smoke, drinks alcohol only occasionally and has not had any alcohol in about 2 weeks.  No other drug use.    Abdominal Pain Gastroesophageal Reflux Associated symptoms include abdominal pain.       Prior to Admission medications   Medication Sig Start Date End Date Taking? Authorizing Provider  cyclobenzaprine  (FLEXERIL ) 10 MG tablet Take 1 tablet (10 mg total) by mouth at bedtime. Do not drink alcohol or drive while taking this medication. May cause drowsiness 04/06/23   Tobie Suzzane POUR, MD  esomeprazole  (NEXIUM ) 40 MG capsule Take 1 capsule (40 mg total) by mouth daily. 09/23/23   Suellen Cantor A, PA-C  predniSONE  (DELTASONE ) 20 MG tablet Take 2 tablets (40 mg total) by mouth daily with breakfast. 04/06/23   Tobie Suzzane POUR, MD    Allergies: Patient has no known allergies.    Review of Systems  Gastrointestinal:  Positive for abdominal pain.    Updated Vital Signs BP 130/89 (BP  Location: Left Arm)   Pulse 67   Temp 97.7 F (36.5 C) (Oral)   Resp 15   Ht 5' 9 (1.753 m)   Wt 97.5 kg   SpO2 99%   BMI 31.75 kg/m   Physical Exam Vitals and nursing note reviewed.  Constitutional:      General: He is not in acute distress.    Appearance: He is well-developed.  HENT:     Head: Normocephalic and atraumatic.  Eyes:     Extraocular Movements: Extraocular movements intact.     Conjunctiva/sclera: Conjunctivae normal.     Pupils: Pupils are equal, round, and reactive to light.  Cardiovascular:     Rate and Rhythm: Normal rate and regular rhythm.     Heart sounds: No murmur heard. Pulmonary:     Effort: Pulmonary effort is normal. No respiratory distress.     Breath sounds: Normal breath sounds.  Abdominal:     Palpations: Abdomen is soft.     Tenderness: There is abdominal tenderness in the left upper quadrant. There is no right CVA tenderness, left CVA tenderness, guarding or rebound. Negative signs include Murphy's sign and McBurney's sign.  Musculoskeletal:        General: No swelling.     Cervical back: Neck supple.  Skin:    General: Skin is warm and dry.     Capillary Refill: Capillary refill takes less  than 2 seconds.  Neurological:     General: No focal deficit present.     Mental Status: He is alert and oriented to person, place, and time.  Psychiatric:        Mood and Affect: Mood normal.     (all labs ordered are listed, but only abnormal results are displayed) Labs Reviewed  CBC WITH DIFFERENTIAL/PLATELET - Abnormal; Notable for the following components:      Result Value   RBC 6.67 (*)    MCV 63.9 (*)    MCH 19.6 (*)    RDW 17.7 (*)    All other components within normal limits  COMPREHENSIVE METABOLIC PANEL WITH GFR - Abnormal; Notable for the following components:   ALT 57 (*)    All other components within normal limits  LIPASE, BLOOD  URINALYSIS, ROUTINE W REFLEX MICROSCOPIC    EKG: None  Radiology: No results  found.   Procedures   Medications Ordered in the ED  pantoprazole  (PROTONIX ) EC tablet 40 mg (has no administration in time range)  famotidine  (PEPCID ) tablet 20 mg (20 mg Oral Given 09/23/23 1503)                                    Medical Decision Making Differential diagnosis includes but not limited to gastritis, GERD, peptic ulcer disease, UTI, pancreatitis, bowel obstruction, cholecystitis, elevated  ED course: Patient presents to the ER today for epigastric and left upper quadrant pain with acid reflux symptoms.  He has in the past but has been out of his PPI.  He has been taking Maalox with some relief but not full relief.  No nausea or vomiting, no hematemesis, hematochezia or melena.  He was given a dose of Pepcid  in the ER and symptoms have resolved his abdominal exam is reassuring with no tenderness rebound guarding or rigidity.  Labs showed no leukocytosis, no anemia, normal kidney function, ALT very slightly elevated 57 but otherwise normal and UA was normal.  Discussed recent benefits of CT imaging with patient, given his symptoms of resolved and he has had similar symptoms before with.  He would prefer symptomatic treatment and will follow-up.  We did discuss that his RBC morphology was abnormal on his CBC with teardrop cells, basophilic stippling and ovalocytes.  Discussed the broad differential for this including anemia, lead toxicity, or bone marrow disorders and he needs to follow-up with hematology.  He was given their information.  He verbalized understanding of the need for this follow-up.  He was given strict return precautions.  Amount and/or Complexity of Data Reviewed External Data Reviewed: labs and notes. Labs: ordered.  Risk Prescription drug management.        Final diagnoses:  Epigastric pain  Abnormal blood smear    ED Discharge Orders          Ordered    esomeprazole  (NEXIUM ) 40 MG capsule  Daily        09/23/23 [address] PA-C 09/23/23 [address] 09/23/23 1537    Patsey Lot, MD 09/24/23 [phone]

## 2024-01-31 ENCOUNTER — Emergency Department (HOSPITAL_COMMUNITY)

## 2024-01-31 ENCOUNTER — Other Ambulatory Visit: Payer: Self-pay

## 2024-01-31 ENCOUNTER — Emergency Department (HOSPITAL_COMMUNITY)
Admission: EM | Admit: 2024-01-31 | Discharge: 2024-01-31 | Disposition: A | Discharge status: Home / Self Care | Attending: Emergency Medicine | Admitting: Emergency Medicine

## 2024-01-31 ENCOUNTER — Encounter (HOSPITAL_COMMUNITY): Payer: Self-pay

## 2024-01-31 DIAGNOSIS — R55 Syncope and collapse: Secondary | ICD-10-CM

## 2024-01-31 DIAGNOSIS — R079 Chest pain, unspecified: Secondary | ICD-10-CM

## 2024-01-31 LAB — CBC
HCT: 42.6 % (ref 39.0–52.0)
Hemoglobin: 13 g/dL (ref 13.0–17.0)
MCH: 19.3 pg — ABNORMAL LOW (ref 26.0–34.0)
MCHC: 30.5 g/dL (ref 30.0–36.0)
MCV: 63.3 fL — ABNORMAL LOW (ref 80.0–100.0)
Platelets: 298 K/uL (ref 150–400)
RBC: 6.73 MIL/uL — ABNORMAL HIGH (ref 4.22–5.81)
RDW: 18 % — ABNORMAL HIGH (ref 11.5–15.5)
WBC: 6.2 K/uL (ref 4.0–10.5)
nRBC: 0 % (ref 0.0–0.2)

## 2024-01-31 LAB — BASIC METABOLIC PANEL WITH GFR
Anion gap: 14 (ref 5–15)
BUN: 11 mg/dL (ref 6–20)
CO2: 23 mmol/L (ref 22–32)
Calcium: 9.3 mg/dL (ref 8.9–10.3)
Chloride: 101 mmol/L (ref 98–111)
Creatinine, Ser: 1.11 mg/dL (ref 0.61–1.24)
GFR, Estimated: >60 mL/min (ref >60)
Glucose, Bld: 99 mg/dL (ref 70–99)
Potassium: 3.9 mmol/L (ref 3.5–5.1)
Sodium: 138 mmol/L (ref 135–145)

## 2024-01-31 LAB — TROPONIN T, HIGH SENSITIVITY
Troponin T High Sensitivity: <15 ng/L (ref 0–19)
Troponin T High Sensitivity: <15 ng/L (ref 0–19)

## 2024-01-31 MED ORDER — ESOMEPRAZOLE MAGNESIUM 40 MG PO CPDR: 40.0000 mg | DELAYED_RELEASE_CAPSULE | Freq: Every day | ORAL | 0 refills | Status: DC

## 2024-01-31 MED ORDER — FAMOTIDINE IN NACL 20-0.9 MG/50ML-% IV SOLN
20.0000 mg | Freq: Once | INTRAVENOUS | Status: AC
  Administered 2024-01-31: 20 mg via INTRAVENOUS
  Filled 2024-01-31: qty 50

## 2024-01-31 NOTE — ED Notes (Signed)
 Patient transported to X-ray

## 2024-01-31 NOTE — ED Triage Notes (Addendum)
 Pt has hx of acid reflux and today while driving had a lot of burning in stomach and took maalox that helped. Pt had a similar episode yesterdayand went to ER in [place].  currently has heart monitor on for 14 days. Denies SOB. Pt denies any symptom at this time but wanted to make sure everything is ok.

## 2024-01-31 NOTE — Discharge Instructions (Signed)
 Your workup was reassuring.  I have refilled your esomeprazole .  I have sent a referral to our local cardiology group.  Someone from their office will likely be contacting you to arrange follow-up appointment.  Please avoid any spicy greasy foods or excessive alcohol.  Return to the emergency department if you develop any new or worsening symptoms.

## 2024-01-31 NOTE — ED Provider Notes (Signed)
 [place] EMERGENCY DEPARTMENT AT [facility] Provider Note   CSN: [id number] Arrival date & time: 01/31/24  1241     Patient presents with: Heartburn   Omar Norris is a 38 y.o. male.    Heartburn Associated symptoms include chest pain and abdominal pain.       Omar Norris is a 38 y.o. male who presents to the Emergency Department complaining of chest pain and burning upper abdominal pain.  He describes pain as burning sensation with pressure sensation to his mid chest.  Began after eating spaghetti.  He states that he has had similar symptoms recently and was seen in [place], [place] facility for this.  He is currently wearing a Holter monitor that was placed there.  He also endorses having intermittent episodes of feeling as though he is going to faint when the symptoms are severe.  He has been taking Maalox with some relief and was taking a PPI but ran out.    Prior to Admission medications   Medication Sig Start Date End Date Taking? Authorizing Provider  cyclobenzaprine  (FLEXERIL ) 10 MG tablet Take 1 tablet (10 mg total) by mouth at bedtime. Do not drink alcohol or drive while taking this medication. May cause drowsiness 04/06/23   Tobie Suzzane POUR, MD  esomeprazole  (NEXIUM ) 40 MG capsule Take 1 capsule (40 mg total) by mouth daily. 09/23/23   Suellen Cantor A, PA-C  predniSONE  (DELTASONE ) 20 MG tablet Take 2 tablets (40 mg total) by mouth daily with breakfast. 04/06/23   Tobie Suzzane POUR, MD    Allergies: Patient has no known allergies.    Review of Systems  Cardiovascular:  Positive for chest pain.  Gastrointestinal:  Positive for abdominal pain and heartburn.  All other systems reviewed and are negative.   Updated Vital Signs BP 122/76 (BP Location: Left Arm)   Pulse 71   Temp 98.4 F (36.9 C) (Oral)   Resp 17   SpO2 98%   Physical Exam Vitals and nursing note reviewed.  Constitutional:      General: He is not in acute distress.    Appearance: Normal  appearance. He is not ill-appearing or toxic-appearing.  HENT:     Mouth/Throat:     Mouth: Mucous membranes are moist.  Cardiovascular:     Rate and Rhythm: Normal rate and regular rhythm.     Pulses: Normal pulses.  Pulmonary:     Effort: Pulmonary effort is normal. No respiratory distress.     Breath sounds: Normal breath sounds.  Chest:     Chest wall: No tenderness.  Abdominal:     General: There is no distension.     Palpations: Abdomen is soft. There is no mass.     Tenderness: There is no abdominal tenderness. There is no guarding.  Musculoskeletal:        General: Normal range of motion.  Skin:    General: Skin is warm.     Capillary Refill: Capillary refill takes less than 2 seconds.  Neurological:     General: No focal deficit present.     Mental Status: He is alert and oriented to person, place, and time.     Cranial Nerves: Cranial nerves 2-12 are intact. No cranial nerve deficit, dysarthria or facial asymmetry.     Sensory: Sensation is intact.     Motor: Motor function is intact. No weakness.     Coordination: Coordination is intact.     (all labs ordered are listed, but  only abnormal results are displayed) Labs Reviewed  CBC - Abnormal; Notable for the following components:      Result Value   RBC 6.73 (*)    MCV 63.3 (*)    MCH 19.3 (*)    RDW 18.0 (*)    All other components within normal limits  BASIC METABOLIC PANEL WITH GFR  TROPONIN T, HIGH SENSITIVITY  TROPONIN T, HIGH SENSITIVITY    EKG: EKG Interpretation Date/Time:  Tuesday January 31 2024 12:54:12 EST Ventricular Rate:  84 PR Interval:  218 QRS Duration:  86 QT Interval:  344 QTC Calculation: 406 R Axis:   57  Text Interpretation: Sinus rhythm with 1st degree A-V block Anteroseptal infarct (cited on or before 26-Feb-2023) Abnormal ECG When compared with ECG of 27-Feb-2023 11:43, PR interval has increased Confirmed by Towana Sharper [phone]) on 01/31/2024 12:58:15 PM  Radiology: [facility]  Chest 2 View Result Date: 01/31/2024 CLINICAL DATA:  chest pain EXAM: CHEST - 2 VIEW COMPARISON:  02/26/2023. FINDINGS: The heart size and mediastinal contours are within normal limits. Both lungs are clear. No pleural effusion or pneumothorax. No acute osseous abnormality. IMPRESSION: No acute cardiopulmonary findings. Electronically Signed   By: Harrietta Sherry M.D.   On: 01/31/2024 14:13     Procedures   Medications Ordered in the ED  famotidine  (PEPCID ) IVPB 20 mg premix (0 mg Intravenous Stopped 01/31/24 1538)                                    Medical Decision Making   Here for evaluation of several episodes of chest pain and feeling near syncopal.  He has had several episodes over the last few days.  Symptoms began after eating spaghetti few days ago.  He endorses history of acid reflux and current symptoms feel similar.  He also endorses recent increased recent stressors   ACS, GERD, esophagitis all considered   Amount and/or Complexity of Data Reviewed Labs: ordered.    Details: Labs unremarkable Radiology: ordered.    Details: CXR unremarkable ECG/medicine tests: ordered.    Details: EkG shows sinus rhythm with first degree AV block Discussion of management or test interpretation with external provider(s): Pt work up overall reassuring.  Doubt emergent process.  I suspect his symptoms related to GERD and possibly exacerbated by his recent stressors.  He has upcoming appt with GI and I will provide referral to cards.  I will refill his PPI.  He was given strict return precautions .  Risk Prescription drug management.        Final diagnoses:  Chest pain, unspecified type  Near syncope    ED Discharge Orders     None          Herlinda Milling, PA-C 02/04/24 0005    Towana Sharper BROCKS, MD 02/04/24 [phone]

## 2024-02-02 ENCOUNTER — Encounter (INDEPENDENT_AMBULATORY_CARE_PROVIDER_SITE_OTHER): Payer: Self-pay | Admitting: Gastroenterology

## 2024-02-02 ENCOUNTER — Ambulatory Visit (INDEPENDENT_AMBULATORY_CARE_PROVIDER_SITE_OTHER): Admitting: Gastroenterology

## 2024-02-02 VITALS — BP 132/77 | HR 76 | Temp 98.1°F | Ht 69.0 in | Wt 214.5 lb

## 2024-02-02 DIAGNOSIS — K219 Gastro-esophageal reflux disease without esophagitis: Secondary | ICD-10-CM

## 2024-02-02 DIAGNOSIS — R0789 Other chest pain: Secondary | ICD-10-CM | POA: Diagnosis not present

## 2024-02-02 NOTE — Patient Instructions (Signed)
 Please continue nexium  40mg  daily Let me know how symptoms are doing next week, if you are still experiencing reflux symptoms, we will increase nexium  to 40mg   If you continue to have symptoms and heart monitor does not show any heart issues, we will do an upper endoscopy to take a further look  Avoid greasy, spicy, fried, citrus foods, and be mindful that caffeine, carbonated drinks, chocolate and alcohol can increase reflux symptoms Stay upright 2-3 hours after eating, prior to lying down and avoid eating late in the evenings.  Follow up 6 weeks  It was a pleasure to see you today. I want to create trusting relationships with patients and provide genuine, compassionate, and quality care. I truly value your feedback! please be on the lookout for a survey regarding your visit with me today. I appreciate your input about our visit and your time in completing this!    Macel Yearsley L. Deyra Perdomo, MSN, APRN, AGNP-C Adult-Gerontology Nurse Practitioner [facility] at [facility]

## 2024-02-02 NOTE — Progress Notes (Signed)
 Referring Provider: Tobie Suzzane POUR, MD Primary Care Physician:  Tobie Suzzane POUR, MD Primary GI Physician: New (Dr. Cinderella)   Chief Complaint  Patient presents with   New Patient (Initial Visit)    Patient coming in office today for heartburn. Patient states acid reflux since Saturday that takes his breath away along with a burning sensation in his stomach. Patient had ED visit 12/9.    HPI:   Omar Norris is a 38 y.o. male with past medical history of GERD, beta thalassemia?  Patient presenting today for:  Heartburn, atypical chest pain   Recent labs on 12/9: Negative troponin Hgb 13 MCV 63.6 (+microcytes on smear) BMP WNL labs 12/8  D dimer negative  TSH 1.36   Iron panel in feb 2025 with TIBC 290 Iron 64 iron sat 22 ferritin 116   Present:  Patient notes some discomfort in his chest with radiation down into his stomach though notes pain in stomach is more of a burning sensation. He had symptom onset a few months back and started on  nexium  40mg  daily which resolved his symptoms. This weekend he ate some baked spaghetti a few days in a row and began to have symptoms again. He states he has some shortness of breath at times. He had ran out of nexium  and was taking 20mg  otc. He went to the ED on Monday in [place]  and states that he was told this was GERD. He had a cardiac workup that he was told was negative. He ended up going to the ED at [facility] on 12/9 and was told symptoms were likely related to GERD, he got a refill of his nexium  40mg  on Tuesday. He ate fried chicken Tuesday and more bland food yesterday, had some sob and some sensation of chest discomfort and burning in his stomach yesterday and today.  He has an occasional cough, notes some belching at times as well. Feels appetite is good, he denies early satiety. No post prandial pain. Denies rectal bleeding, melena, constipation. He had some diarrhea over the last few days but notes he was eating very minimal food. Denies any  dysphagia or odynophagia. Denies any weight loss.  Does not use NSAIDs, no recent antibiotics or steroids. He does report currently wearing a cardiac monitor to rule out any underlying cardiac issues   NSAID [id number] Social hx: alcohol on occasion, vapes on occasion  Fam hx: no CRC or liver disease  Last Colonoscopy: never  Last Endoscopy: never    [facility] Weights   02/02/24 1442  Weight: 214 lb 8 oz (97.3 kg)     Past Medical History:  Diagnosis Date   GERD (gastroesophageal reflux disease)     History reviewed. No pertinent surgical history.  Current Outpatient Medications  Medication Sig Dispense Refill   esomeprazole  (NEXIUM ) 40 MG capsule Take 1 capsule (40 mg total) by mouth daily. (Patient taking differently: Take 40 mg by mouth daily. Patient attempted taking one in the morning and one at night attempting to get the medication to kick in.) 30 capsule 5   No current facility-administered medications for this visit.    Allergies as of 02/02/2024   (No Known Allergies)    Social History   Socioeconomic History   Marital status: Single    Spouse name: Not on file   Number of children: Not on file   Years of education: Not on file   Highest education level: Not on file  Occupational History   Not on  file  Tobacco Use   Smoking status: Former    Types: Cigars   Smokeless tobacco: Never   Tobacco comments:    weekends  Vaping Use   Vaping status: Some Days  Substance and Sexual Activity   Alcohol use: Yes    Alcohol/week: 7.0 standard drinks of alcohol    Types: 2 Cans of beer, 5 Shots of liquor per week    Comment: Occas   Drug use: No   Sexual activity: Yes    Birth control/protection: Condom  Other Topics Concern   Not on file  Social History Narrative   Not on file   Social Drivers of Health   Tobacco Use: Medium Risk (02/02/2024)   Patient History    Smoking Tobacco Use: Former    Smokeless Tobacco Use: Never    Passive Exposure: Not on Programmer, Applications Strain: Not on file  Food Insecurity: Not on file  Transportation Needs: Not on file  Physical Activity: Not on file  Stress: Not on file  Social Connections: Unknown (06/25/2021)   Received from [facility]   Social Network    Social Network: Not on file  Depression (PHQ2-9): Low Risk (04/06/2023)   Depression (PHQ2-9)    PHQ-2 Score: 0  Alcohol Screen: Not on file  Housing: Not on file  Utilities: Not on file  Health Literacy: Not on file   Review of systems General: negative for malaise, night sweats, fever, chills, weight loss Neck: Negative for lumps, goiter, pain and significant neck swelling Resp: Negative for cough, wheezing, dyspnea at rest CV: Negative for chest pain, leg swelling, palpitations, orthopnea GI: denies melena, hematochezia, nausea, vomiting, diarrhea, constipation, dysphagia, odyonophagia, early satiety or unintentional weight loss. +burning in chest/stomach +chest pressure  MSK: Negative for joint pain or swelling, back pain, and muscle pain. Derm: Negative for itching or rash Psych: Denies depression, anxiety, memory loss, confusion. No homicidal or suicidal ideation.  Heme: Negative for prolonged bleeding, bruising easily, and swollen nodes. Endocrine: Negative for cold or heat intolerance, polyuria, polydipsia and goiter. Neuro: negative for tremor, gait imbalance, syncope and seizures. The remainder of the review of systems is noncontributory.  Physical Exam: BP 132/77 (BP Location: Right Arm, Patient Position: Sitting, Cuff Size: Large)   Pulse 76   Temp 98.1 F (36.7 C) (Oral)   Ht 5' 9 (1.753 m)   Wt 214 lb 8 oz (97.3 kg)   BMI 31.68 kg/m  General:   Alert and oriented. No distress noted. Pleasant and cooperative.  Head:  Normocephalic and atraumatic. Eyes:  Conjuctiva clear without scleral icterus. Mouth:  Oral mucosa pink and moist. Good dentition. No lesions. Heart: Normal rate and rhythm, s1 and s2 heart sounds  present.  Lungs: Clear lung sounds in all lobes. Respirations equal and unlabored. Abdomen:  +BS, soft, non-tender and non-distended. No rebound or guarding. No HSM or masses noted. Derm: No palmar erythema or jaundice Msk:  Symmetrical without gross deformities. Normal posture. Extremities:  Without edema. Neurologic:  Alert and  oriented x4 Psych:  Alert and cooperative. Normal mood and affect.  Invalid input(s): 6 MONTHS   ASSESSMENT: Omar Norris is a 38 y.o. male presenting today as a new patient for heartburn and atypical chest pain  Patient with history of burning in stomach and chest/chest pain, previously had symptoms that improved with nexium  40mg  daily though notes he recently ran out and was taking nexium  20mg , he ate some baked spaghetti and began having chest  discomfort and burning in his stomach. He was see in the ER in [place]  and again at [facility] with reassuring workup and told symptoms likely from GERD. He was restarted on nexium  40mg  and is currently wearing a cardiac monitro for further evaluation. He denies any other associated GI symptoms. At this time, I agree it is important to rule out underlying cardiac etiology though I do think he is having symptoms of GERD as well. Given no alarm symptoms, will continue with nexium  40mg  daily, strict reflux precautions, will consider increasing PPI to BID dosing if still having symptoms into next week. If cardiac monitoring is unremarkable and symptoms persist despite BID dosing of PPI, will proceed with EGD for further evaluation.    PLAN:  -continue with nexium  40mg  daily  -increase to BID if not improving in 1 week -strict reflux precautions, discussed and provided in AVS to the patient -EGD if symptoms persist and cardiac monitoring is normal  All questions were answered, patient verbalized understanding and is in agreement with plan as outlined above.   Follow Up: 6 weeks   Dwane Andres L. Citlalli Weikel, MSN, APRN,  AGNP-C Adult-Gerontology Nurse Practitioner [facility]

## 2024-02-15 ENCOUNTER — Encounter: Admitting: Internal Medicine

## 2024-02-20 ENCOUNTER — Encounter: Admitting: Internal Medicine

## 2024-03-13 ENCOUNTER — Encounter (INDEPENDENT_AMBULATORY_CARE_PROVIDER_SITE_OTHER): Admitting: Gastroenterology

## 2024-03-29 ENCOUNTER — Ambulatory Visit: Payer: Self-pay | Admitting: Cardiology

## 2024-03-29 ENCOUNTER — Encounter: Payer: Self-pay | Admitting: *Deleted

## 2024-03-29 NOTE — Progress Notes (Unsigned)
" ° ° ° ° °  Clinical Summary Omar Norris is a 39 y.o.male seen today as a new patient for the following medical problems  1.Near syncope - ER visit 01/30/24 at [facility]. From notes reported episodes of SOB with lightheadedness and some chest tightness - EKG per report SR, first degree AV block, abnormal ST-T V2-V5).  - negative trops and Ddimer, plans were for outpatient zio patch    ER visit 01/31/24 at [facility] with chest paind and upper abdominal pain started after eating spaghetti. Reports pain similar to his prior GERD. Had been taking nexium  20mg  as opposed to his prior 40mg  at the time.  - EKG SR, no acute ischemic changes.Trops neg x 2.    Seen in 2021 by Dr Omar Norris for atypical chest pain 2021 GXT: 12 minutes, no ischemic changes  CXR benign Past Medical History:  Diagnosis Date   GERD (gastroesophageal reflux disease)      Allergies[1]   Current Outpatient Medications  Medication Sig Dispense Refill   esomeprazole  (NEXIUM ) 40 MG capsule Take 1 capsule (40 mg total) by mouth daily. (Patient taking differently: Take 40 mg by mouth daily. Patient attempted taking one in the morning and one at night attempting to get the medication to kick in.) 30 capsule 5   No current facility-administered medications for this visit.     No past surgical history on file.   Allergies[2]    Family History  Problem Relation Age of Onset   Hypertension Mother    Hyperlipidemia Mother      Social History Omar Norris reports that he has quit smoking. His smoking use included cigars. He has never used smokeless tobacco. Omar Norris reports current alcohol use of about 7.0 standard drinks of alcohol per week.   Review of Systems CONSTITUTIONAL: No weight loss, fever, chills, weakness or fatigue.  HEENT: Eyes: No visual loss, blurred vision, double vision or yellow sclerae.No hearing loss, sneezing, congestion, runny nose or sore throat.  SKIN: No rash or itching.  CARDIOVASCULAR:   RESPIRATORY: No shortness of breath, cough or sputum.  GASTROINTESTINAL: No anorexia, nausea, vomiting or diarrhea. No abdominal pain or blood.  GENITOURINARY: No burning on urination, no polyuria NEUROLOGICAL: No headache, dizziness, syncope, paralysis, ataxia, numbness or tingling in the extremities. No change in bowel or bladder control.  MUSCULOSKELETAL: No muscle, back pain, joint pain or stiffness.  LYMPHATICS: No enlarged nodes. No history of splenectomy.  PSYCHIATRIC: No history of depression or anxiety.  ENDOCRINOLOGIC: No reports of sweating, cold or heat intolerance. No polyuria or polydipsia.  [name]   Physical Examination There were no vitals filed for this visit. There were no vitals filed for this visit.  Gen: resting comfortably, no acute distress HEENT: no scleral icterus, pupils equal round and reactive, no palptable cervical adenopathy,  CV Resp: Clear to auscultation bilaterally GI: abdomen is soft, non-tender, non-distended, normal bowel sounds, no hepatosplenomegaly MSK: extremities are warm, no edema.  Skin: warm, no rash Neuro:  no focal deficits Psych: appropriate affect   Diagnostic Studies     Assessment and Plan        Omar Norris, M.D., F.A.C.C.    [1] No Known Allergies [2] No Known Allergies  "

## 2024-04-09 ENCOUNTER — Encounter (INDEPENDENT_AMBULATORY_CARE_PROVIDER_SITE_OTHER): Admitting: Gastroenterology

## 2024-04-19 ENCOUNTER — Ambulatory Visit: Payer: Self-pay | Admitting: Cardiology

## 2024-04-26 ENCOUNTER — Encounter: Admitting: Internal Medicine
